# Patient Record
Sex: Female | Born: 1955
Health system: Southern US, Community
[De-identification: ages and names within clinical notes are randomized; demographics above are authoritative.]

## PROBLEM LIST (undated history)

## (undated) DIAGNOSIS — M1990 Unspecified osteoarthritis, unspecified site: Secondary | ICD-10-CM

## (undated) DIAGNOSIS — E7800 Pure hypercholesterolemia, unspecified: Secondary | ICD-10-CM

## (undated) DIAGNOSIS — F429 Obsessive-compulsive disorder, unspecified: Secondary | ICD-10-CM

## (undated) DIAGNOSIS — R7309 Other abnormal glucose: Secondary | ICD-10-CM

## (undated) DIAGNOSIS — K219 Gastro-esophageal reflux disease without esophagitis: Secondary | ICD-10-CM

## (undated) DIAGNOSIS — Z86018 Personal history of other benign neoplasm: Secondary | ICD-10-CM

## (undated) DIAGNOSIS — Z84A Family history of exposure to diethylstilbestrol: Secondary | ICD-10-CM

## (undated) DIAGNOSIS — B977 Papillomavirus as the cause of diseases classified elsewhere: Secondary | ICD-10-CM

## (undated) DIAGNOSIS — Z9889 Other specified postprocedural states: Secondary | ICD-10-CM

## (undated) DIAGNOSIS — F419 Anxiety disorder, unspecified: Secondary | ICD-10-CM

## (undated) DIAGNOSIS — N6019 Diffuse cystic mastopathy of unspecified breast: Secondary | ICD-10-CM

## (undated) DIAGNOSIS — Z8489 Family history of other specified conditions: Secondary | ICD-10-CM

## (undated) DIAGNOSIS — R7303 Prediabetes: Secondary | ICD-10-CM

## (undated) HISTORY — PX: DILATION AND CURETTAGE OF UTERUS: SHX78

## (undated) HISTORY — DX: Papillomavirus as the cause of diseases classified elsewhere: B97.7

## (undated) HISTORY — DX: Diffuse cystic mastopathy of unspecified breast: N60.19

## (undated) HISTORY — DX: Obsessive-compulsive disorder, unspecified: F42.9

## (undated) HISTORY — DX: Pure hypercholesterolemia, unspecified: E78.00

## (undated) HISTORY — PX: LIPOMA EXCISION: SHX5283

## (undated) HISTORY — DX: Family history of exposure to diethylstilbestrol: Z84.A

## (undated) HISTORY — PX: COLONOSCOPY: SHX174

## (undated) HISTORY — PX: BREAST BIOPSY: SHX20

## (undated) HISTORY — DX: Other specified postprocedural states: Z86.018

## (undated) HISTORY — DX: Personal history of other benign neoplasm: Z98.890

## (undated) HISTORY — PX: ANKLE SURGERY: SHX546

## (undated) HISTORY — DX: Family history of other specified conditions: Z84.89

## (undated) HISTORY — DX: Gastro-esophageal reflux disease without esophagitis: K21.9

## (undated) HISTORY — DX: Other abnormal glucose: R73.09

---

## 1997-12-05 ENCOUNTER — Other Ambulatory Visit: Admission: RE | Admit: 1997-12-05 | Discharge: 1997-12-05 | Payer: Self-pay | Admitting: Gynecology

## 1998-06-18 ENCOUNTER — Other Ambulatory Visit: Admission: RE | Admit: 1998-06-18 | Discharge: 1998-06-18 | Payer: Self-pay | Admitting: Gynecology

## 1998-11-27 ENCOUNTER — Other Ambulatory Visit: Admission: RE | Admit: 1998-11-27 | Discharge: 1998-11-27 | Payer: Self-pay | Admitting: Gynecology

## 1998-12-26 ENCOUNTER — Other Ambulatory Visit: Admission: RE | Admit: 1998-12-26 | Discharge: 1998-12-26 | Payer: Self-pay | Admitting: Gynecology

## 2002-11-18 ENCOUNTER — Other Ambulatory Visit: Admission: RE | Admit: 2002-11-18 | Discharge: 2002-11-18 | Payer: Self-pay | Admitting: Gynecology

## 2003-10-23 ENCOUNTER — Other Ambulatory Visit: Admission: RE | Admit: 2003-10-23 | Discharge: 2003-10-23 | Payer: Self-pay | Admitting: Gynecology

## 2004-01-09 ENCOUNTER — Other Ambulatory Visit: Admission: RE | Admit: 2004-01-09 | Discharge: 2004-01-09 | Payer: Self-pay | Admitting: Gynecology

## 2004-10-25 ENCOUNTER — Other Ambulatory Visit: Admission: RE | Admit: 2004-10-25 | Discharge: 2004-10-25 | Payer: Self-pay | Admitting: Gynecology

## 2005-10-28 ENCOUNTER — Other Ambulatory Visit: Admission: RE | Admit: 2005-10-28 | Discharge: 2005-10-28 | Payer: Self-pay | Admitting: Gynecology

## 2006-10-30 ENCOUNTER — Other Ambulatory Visit: Admission: RE | Admit: 2006-10-30 | Discharge: 2006-10-30 | Payer: Self-pay | Admitting: Obstetrics and Gynecology

## 2007-11-05 ENCOUNTER — Other Ambulatory Visit: Admission: RE | Admit: 2007-11-05 | Discharge: 2007-11-05 | Payer: Self-pay | Admitting: Obstetrics and Gynecology

## 2008-11-10 ENCOUNTER — Other Ambulatory Visit: Admission: RE | Admit: 2008-11-10 | Discharge: 2008-11-10 | Payer: Self-pay | Admitting: Obstetrics and Gynecology

## 2012-12-02 ENCOUNTER — Encounter: Payer: Self-pay | Admitting: Obstetrics and Gynecology

## 2012-12-06 ENCOUNTER — Encounter: Payer: Self-pay | Admitting: Obstetrics and Gynecology

## 2012-12-06 ENCOUNTER — Ambulatory Visit (INDEPENDENT_AMBULATORY_CARE_PROVIDER_SITE_OTHER): Payer: No Typology Code available for payment source | Admitting: Obstetrics and Gynecology

## 2012-12-06 VITALS — BP 100/64 | Ht 63.5 in | Wt 145.0 lb

## 2012-12-06 DIAGNOSIS — Z0000 Encounter for general adult medical examination without abnormal findings: Secondary | ICD-10-CM

## 2012-12-06 DIAGNOSIS — Z01419 Encounter for gynecological examination (general) (routine) without abnormal findings: Secondary | ICD-10-CM

## 2012-12-06 LAB — POCT URINALYSIS DIPSTICK
Bilirubin, UA: NEGATIVE
Blood, UA: NEGATIVE
Glucose, UA: NEGATIVE
Ketones, UA: NEGATIVE

## 2012-12-06 MED ORDER — PROGESTERONE MICRONIZED 100 MG PO CAPS: 100.0000 mg | ORAL_CAPSULE | Freq: Every day | ORAL | Status: DC

## 2012-12-06 MED ORDER — ESTRADIOL ACETATE 0.05 MG/24HR VA RING: 1.0000 | VAGINAL_RING | VAGINAL | Status: DC

## 2012-12-06 NOTE — Progress Notes (Addendum)
57 y.o. Married  Caucasian female   G1P0 here for annual exam.  Likes her HRT and wants to continue.    No LMP recorded. Patient is postmenopausal.          Sexually active: yes  The current method of family planning is post menopausal status.    Exercising: fitness instructor, spin, weight lift, pilates , walking Last mammogram:  02/26/12 neg Last pap smear: 11/16/09 neg History of abnormal pap: 10/03/2003 atypical squamous cells of undetermined significance Smoking:quit 37 years ago Alcohol:occ glass of wine Last colonoscopy:08/04/2006 normal repeat in 10 years Last Bone Density:  never Last tetanus shot: 2012 Last cholesterol check: 11/23/12 at [facility] [employer], pending results  Hgb:   13.8           Urine:neg    Health Maintenance  Topic Date Due  . Tetanus/tdap  04/17/1973  . Pap Smear  11/16/2012  . Influenza Vaccine  04/04/2013  . Mammogram  02/25/2014  . Colonoscopy  08/04/2016    Family History  Problem Relation Age of Onset  . Osteoporosis Maternal Grandmother   . Stroke Maternal Grandfather   . Breast cancer Mother     There are no active problems to display for this patient.   Past Medical History  Diagnosis Date  . HPV (human papilloma virus) infection   . OCD (obsessive compulsive disorder)   . Family history of DES exposure   . GERD (gastroesophageal reflux disease)   . S/P excision of lipoma   . Fibrocystic breast     Past Surgical History  Procedure Laterality Date  . Ankle surgery      broken as a child  . Lipoma excision      rib cage  . Dilation and curettage of uterus      Allergies: Aspirin  Current Outpatient Prescriptions  Medication Sig Dispense Refill  . CALCIUM PO Take 500 mg by mouth 2 (two) times daily.      Marland Kitchen escitalopram (LEXAPRO) 10 MG tablet Take 10 mg by mouth daily.      . Estradiol Acetate (FEMRING) 0.05 MG/24HR RING Place vaginally every 3 (three) months.      . progesterone (PROMETRIUM) 100 MG capsule Take 100 mg  by mouth at bedtime.      . ranitidine (ZANTAC) 150 MG tablet Take 150 mg by mouth as needed for heartburn.      . Dexlansoprazole (DEXILANT PO) Take 60 mg by mouth daily.       No current facility-administered medications for this visit.    ROS: Pertinent items are noted in HPI.  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, no children  Exam:    BP 100/64  Ht 5' 3.5" (1.613 m)  Wt 145 lb (65.772 kg)  BMI 25.28 kg/m2  Down 2 pounds from last year Wt Readings from Last 3 Encounters:  12/06/12 145 lb (65.772 kg)     Ht Readings from Last 3 Encounters:  12/06/12 5' 3.5" (1.613 m)    General appearance: alert, cooperative and appears stated age Head: Normocephalic, without obvious abnormality, atraumatic Neck: no adenopathy, supple, symmetrical, trachea midline and thyroid not enlarged, symmetric, no tenderness/mass/nodules Lungs: clear to auscultation bilaterally Breasts: Inspection negative, No nipple retraction or dimpling, No nipple discharge or bleeding, No axillary or supraclavicular adenopathy, Normal to palpation without dominant masses Heart: regular rate and rhythm Abdomen: soft, non-tender; bowel sounds normal; no masses,  no organomegaly Extremities: extremities normal,  atraumatic, no cyanosis or edema Skin: Skin color, texture, turgor normal. No rashes or lesions Lymph nodes: Cervical, supraclavicular, and axillary nodes normal. No abnormal inguinal nodes palpated Neurologic: Grossly normal   Pelvic: External genitalia:  no lesions              Urethra:  normal appearing urethra with no masses, tenderness or lesions              Bartholins and Skenes: normal                 Vagina: normal appearing vagina with normal color and discharge, no lesions              Cervix: normal appearance, os stenotic and would not allow a cytobrush              Pap taken: yes        Bimanual Exam:  Uterus:  uterus is normal size, shape, consistency  and nontender                                      Adnexa: normal adnexa in size, nontender and no masses                                      Rectovaginal: Confirms                                      Anus:  normal sphincter tone, no lesions  A: normal menoopausal exam, on HRT     DES exposed     OCD     Mother with breast cancer at age 27     P: mammogram pap smear counseled on breast self exam, mammography screening, use and side effects of HRT, adequate intake of calcium and vitamin D, diet and exercise return annually or prn     An After Visit Summary was printed and given to the patient.

## 2012-12-06 NOTE — Patient Instructions (Signed)

## 2012-12-07 LAB — HEMOGLOBIN, FINGERSTICK: Hemoglobin, fingerstick: 13.8 g/dL (ref 12.0–16.0)

## 2013-01-11 ENCOUNTER — Other Ambulatory Visit: Payer: Self-pay | Admitting: Obstetrics and Gynecology

## 2013-01-11 NOTE — Telephone Encounter (Signed)
Fine to RF through AEX.

## 2013-01-11 NOTE — Telephone Encounter (Signed)
Last Aex 12/06/12 Next Aex 12/12/13  Has 2 requests Femring 0.05 mg and Progesterone 100 mg

## 2013-03-04 HISTORY — PX: TRIGGER FINGER RELEASE: SHX641

## 2013-12-07 ENCOUNTER — Other Ambulatory Visit: Payer: Self-pay | Admitting: *Deleted

## 2013-12-07 MED ORDER — ESTRADIOL ACETATE 0.05 MG/24HR VA RING: VAGINAL_RING | VAGINAL | Status: DC

## 2013-12-07 NOTE — Telephone Encounter (Signed)
Last AEX 12/06/12 Last refill 01/11/13 #1/3 refills Next appt 12/12/13  Will refill once until appt.

## 2013-12-12 ENCOUNTER — Ambulatory Visit: Payer: No Typology Code available for payment source | Admitting: Obstetrics and Gynecology

## 2013-12-12 ENCOUNTER — Encounter: Payer: Self-pay | Admitting: Gynecology

## 2013-12-12 ENCOUNTER — Ambulatory Visit (INDEPENDENT_AMBULATORY_CARE_PROVIDER_SITE_OTHER): Payer: No Typology Code available for payment source | Admitting: Gynecology

## 2013-12-12 VITALS — BP 120/74 | HR 60 | Resp 18 | Ht 63.5 in | Wt 144.0 lb

## 2013-12-12 DIAGNOSIS — Z01419 Encounter for gynecological examination (general) (routine) without abnormal findings: Secondary | ICD-10-CM

## 2013-12-12 DIAGNOSIS — Z0000 Encounter for general adult medical examination without abnormal findings: Secondary | ICD-10-CM

## 2013-12-12 DIAGNOSIS — Z803 Family history of malignant neoplasm of breast: Secondary | ICD-10-CM

## 2013-12-12 DIAGNOSIS — Z79890 Hormone replacement therapy (postmenopausal): Secondary | ICD-10-CM

## 2013-12-12 DIAGNOSIS — Z9189 Other specified personal risk factors, not elsewhere classified: Secondary | ICD-10-CM | POA: Insufficient documentation

## 2013-12-12 LAB — POCT URINALYSIS DIPSTICK
BILIRUBIN UA: NEGATIVE
Glucose, UA: NEGATIVE
Ketones, UA: NEGATIVE
Leukocytes, UA: NEGATIVE
NITRITE UA: NEGATIVE
PH UA: 7
PROTEIN UA: NEGATIVE
RBC UA: NEGATIVE
Urobilinogen, UA: NEGATIVE

## 2013-12-12 MED ORDER — CONJ ESTROGENS-BAZEDOXIFENE 0.45-20 MG PO TABS: 1.0000 | ORAL_TABLET | Freq: Every day | ORAL | Status: DC

## 2013-12-12 NOTE — Progress Notes (Signed)
58 y.o. Married Caucasian female   G1P0 here for annual exam.  She does not report hot flashes, does have night sweats, does not have vaginal dryness.  She is not using lubricants.  She does not report post-menopasual bleeding.  Pt is concerned regarding her HRT, her mother was diagnosed with 2 primary breast cancers at 51, treated only with lumpectomy and now has a recurrence vs new primary.  Mother took DES in past.    No LMP recorded. Patient is postmenopausal.          Sexually active: yes  The current method of family planning is post menopausal status.    Exercising: yes  Gym/ health club routine includes cardio, light weights, treadmill and walking on track . Last pap: 12/2012 Neg. HR HPV: Neg Abnormal PAP: yes, 2005  Mammogram: 03/01/13 BI-RADS 2: Benign  BSE: once a month Colonoscopy: 2008 - Every 10 years DEXA: Never Alcohol: Yes, Occ glass of wine Tobacco: Former  Corporate treasurer  Topic Date Due  . Tetanus/tdap  04/17/1973  . Influenza Vaccine  03/04/2014  . Mammogram  03/02/2015  . Pap Smear  12/07/2015  . Colonoscopy  08/04/2016    Family History  Problem Relation Age of Onset  . Osteoporosis Maternal Grandmother   . Stroke Maternal Grandfather   . Breast cancer Mother     There are no active problems to display for this patient.   Past Medical History  Diagnosis Date  . HPV (human papilloma virus) infection   . OCD (obsessive compulsive disorder)   . Family history of DES exposure   . GERD (gastroesophageal reflux disease)   . S/P excision of lipoma   . Fibrocystic breast     Past Surgical History  Procedure Laterality Date  . Ankle surgery      broken as a child  . Lipoma excision      rib cage  . Dilation and curettage of uterus      Allergies: Aspirin  Current Outpatient Prescriptions  Medication Sig Dispense Refill  . CALCIUM PO Take 500 mg by mouth 2 (two) times daily.      Marland Kitchen Dexlansoprazole (DEXILANT PO) Take 60 mg by mouth daily.       Marland Kitchen escitalopram (LEXAPRO) 10 MG tablet Take 10 mg by mouth daily.      . Estradiol Acetate (FEMRING) 0.05 MG/24HR RING Use 1 ring vaginally every 3 months.  1 each  0  . progesterone (PROMETRIUM) 100 MG capsule TAKE ONE CAPSULE EVERY DAY  90 capsule  3  . ranitidine (ZANTAC) 150 MG tablet Take 150 mg by mouth as needed for heartburn.       No current facility-administered medications for this visit.    ROS: Pertinent items are noted in HPI.  Exam:    BP 120/74  Pulse 60  Resp 18  Ht 5' 3.5" (1.613 m)  Wt 144 lb (65.318 kg)  BMI 25.11 kg/m2 Weight change: @WEIGHTCHANGE @ Last 3 height recordings:  Ht Readings from Last 3 Encounters:  12/12/13 5' 3.5" (1.613 m)  12/06/12 5' 3.5" (1.613 m)   General appearance: alert, cooperative and appears stated age Head: Normocephalic, without obvious abnormality, atraumatic Neck: no adenopathy, no carotid bruit, no JVD, supple, symmetrical, trachea midline and thyroid not enlarged, symmetric, no tenderness/mass/nodules Lungs: clear to auscultation bilaterally Breasts: normal appearance, no masses or tenderness Heart: regular rate and rhythm, S1, S2 normal, no murmur, click, rub or gallop Abdomen: soft, non-tender; bowel sounds normal; no masses,  no organomegaly Extremities: extremities normal, atraumatic, no cyanosis or edema Skin: Skin color, texture, turgor normal. No rashes or lesions Lymph nodes: Cervical, supraclavicular, and axillary nodes normal. no inguinal nodes palpated Neurologic: Grossly normal   Pelvic: External genitalia:  no lesions              Urethra: normal appearing urethra with no masses, tenderness or lesions              Bartholins and Skenes: normal                 Vagina: normal appearing vagina with normal color and discharge, no lesions              Cervix: normal appearance and tight os              Pap taken: yes        Bimanual Exam:  Uterus:  uterus is normal size, shape, consistency and nontender                                       Adnexa:    no masses                                      Rectovaginal: Confirms                                      Anus:  normal sphincter tone, no lesions  A: well woman 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  Her mother is having a consult tomorrow and pt will know more afterwards.  Mother does have increased risk due to DES, cannot rule out genetic with 2-3 primaries.  We discussed stopping HRT but recommend vaginal estrogen, can try effexor or lexapro at higher dose to counter hot flashes, we discussed duavee that would protect against breast cancer.  We reviewed the findings of the [facility] and nurses studies and different outcomes re CVD and CVA.  Pt would like to change to [facility] and may change her mind after her mother's f/u counseled on breast self exam, mammography screening, adequate intake of calcium and vitamin D, diet and exercise return annually or prn Discussed PAP guideline changes, importance of weight bearing exercises, calcium, vit D and balanced diet.  An After Visit Summary was printed and given to the patient.

## 2013-12-12 NOTE — Patient Instructions (Signed)

## 2013-12-13 LAB — IPS PAP SMEAR ONLY

## 2013-12-15 ENCOUNTER — Telehealth: Payer: Self-pay | Admitting: Gynecology

## 2013-12-15 NOTE — Telephone Encounter (Signed)
Spoke with patient. Patient states that she was seen Monday by Dr.Lathrop and they decided patient would start [facility] instead of using estrogen ring and progesterone due to mother's history with breast cancer. Patient calling to let Dr.Lathrop know that her mother was diagnosed with another type of breast cancer this week. The first diagnoses was ductal breast cancer and the second is lobular breast cancer. Patient took estrogen ring out last night and stopped taking progesterone. Would like to know if Dr.Lathrop still recommends that she take [facility] with her mother's new diagnosis. "I am willing not to take anything if that is recommended but if she thinks that it is okay for me to take [facility] then I will." Advised would send a message Dr.Lathrop and give patient a call back with any further recommendations or instructions. Patient agreeable.

## 2013-12-15 NOTE — Telephone Encounter (Signed)
Patient has some questions about her mom being diagnosed with breast cancer. Patient has new information and wants to be sure she is taking the correct RX for herself.  [place] [employer]

## 2013-12-19 NOTE — Telephone Encounter (Signed)
Patient calling to see if Dr.Lathrop had any further instructions for her using Duavee. Advised patient I had not yet received a response regarding her taking Duavee but that I will make sure that I speak with Dr.Lathrop as soon as possible today and give patient a call back with her advice. Patient agreeable.

## 2013-12-19 NOTE — Telephone Encounter (Signed)
Spoke with patient. Message from [place] given as seen below. Patient agreeable and verbalizes understanding.  Routing to provider for final review. Patient agreeable to disposition. Will close encounter

## 2013-12-19 NOTE — Telephone Encounter (Signed)
While her mother took DES which increases the mother's risk for breast cancer but not the patients, the duavee would decrease her risk for breast cancer

## 2014-06-05 ENCOUNTER — Encounter: Payer: Self-pay | Admitting: Gynecology

## 2014-07-04 ENCOUNTER — Telehealth: Payer: Self-pay | Admitting: Gynecology

## 2014-07-04 NOTE — Telephone Encounter (Signed)
Left message regarding upcoming appointment has been canceled and needs to be rescheduled. °

## 2014-12-18 ENCOUNTER — Ambulatory Visit: Payer: No Typology Code available for payment source | Admitting: Gynecology

## 2015-01-04 ENCOUNTER — Ambulatory Visit (INDEPENDENT_AMBULATORY_CARE_PROVIDER_SITE_OTHER): Payer: No Typology Code available for payment source | Admitting: Obstetrics and Gynecology

## 2015-01-04 ENCOUNTER — Encounter: Payer: Self-pay | Admitting: Obstetrics and Gynecology

## 2015-01-04 VITALS — BP 114/68 | HR 76 | Resp 14 | Ht 63.5 in | Wt 147.0 lb

## 2015-01-04 DIAGNOSIS — Z79890 Hormone replacement therapy (postmenopausal): Secondary | ICD-10-CM

## 2015-01-04 DIAGNOSIS — Z803 Family history of malignant neoplasm of breast: Secondary | ICD-10-CM | POA: Diagnosis not present

## 2015-01-04 DIAGNOSIS — Z01419 Encounter for gynecological examination (general) (routine) without abnormal findings: Secondary | ICD-10-CM

## 2015-01-04 MED ORDER — CONJ ESTROGENS-BAZEDOXIFENE 0.45-20 MG PO TABS: 1.0000 | ORAL_TABLET | Freq: Every day | ORAL | Status: DC

## 2015-01-04 NOTE — Progress Notes (Signed)
Patient ID: Ashley Blair, female   DOB: 01-31-1956, 59 y.o.   MRN: [id number] 59 y.o. G1P0 Unknown Caucasian female here for annual exam.    Mother with history of breast cancer at age 23 or 23 and then had a second primary breast cancer. Genetic testing was negative.   Patient was placed on [facility]. Occasionally has night sweats.  Went directly from [facility] to HRT.  Took Prometrium and FemRing in the past.  Switched to [facility] with last annual exam.  Labs with PCP.  High HDL cholesterol and LDL is slightly high.   PCP:  Dr. Leighton Ruff  No LMP recorded. Patient is postmenopausal.          Sexually active: Yes.    The current method of family planning is condoms and post menopausal status.    Exercising: Yes.    Cardio and weight lifting Smoker:  Former smoker  Health Maintenance: Pap:  12-12-13 WNL H/O DES History of abnormal Pap:  Yes.  Has had colposcopies and no treatment.  History of colposcopy.  MMG:  03-07-14 3D WNL fibroglandular density.  History of breast cysts. History of breast biopsy on left about 5 years ago at [facility].  Colonoscopy:  08-04-06 repeat in 10 years BMD:   Never TDaP:  With in the last 10 years per patient Screening Labs:  PCP Hb today: PCP Urine today: PCP   reports that she quit smoking about 39 years ago. She has never used smokeless tobacco. She reports that she drinks about 0.5 oz of alcohol per week. She reports that she does not use illicit drugs.  Past Medical History  Diagnosis Date  . HPV (human papilloma virus) infection   . OCD (obsessive compulsive disorder)   . Family history of DES exposure   . GERD (gastroesophageal reflux disease)   . S/P excision of lipoma   . Fibrocystic breast     Past Surgical History  Procedure Laterality Date  . Ankle surgery      broken as a child  . Lipoma excision      rib cage  . Dilation and curettage of uterus      Current Outpatient Prescriptions  Medication Sig Dispense Refill  . CALCIUM  PO Take 500 mg by mouth 2 (two) times daily.    Marland Kitchen Conj Estrogens-Bazedoxifene (DUAVEE) 0.45-20 MG TABS Take 1 tablet by mouth daily. 30 tablet 12  . Dexlansoprazole (DEXILANT PO) Take 60 mg by mouth daily.    Marland Kitchen escitalopram (LEXAPRO) 10 MG tablet Take 10 mg by mouth daily.    . Estradiol Acetate (FEMRING) 0.05 MG/24HR RING Use 1 ring vaginally every 3 months. 1 each 0  . progesterone (PROMETRIUM) 100 MG capsule TAKE ONE CAPSULE EVERY DAY 90 capsule 3  . ranitidine (ZANTAC) 150 MG tablet Take 150 mg by mouth as needed for heartburn.     No current facility-administered medications for this visit.    Family History  Problem Relation Age of Onset  . Osteoporosis Maternal Grandmother   . Stroke Maternal Grandfather   . Breast cancer Mother     ROS:  Pertinent items are noted in HPI.  Otherwise, a comprehensive ROS was negative.  Exam:   There were no vitals taken for this visit.    General appearance: alert, cooperative and appears stated age Head: Normocephalic, without obvious abnormality, atraumatic Neck: no adenopathy, supple, symmetrical, trachea midline and thyroid normal to inspection and palpation Lungs: clear to auscultation bilaterally Breasts: normal appearance, no masses  or tenderness, Inspection negative, No nipple retraction or dimpling, No nipple discharge or bleeding, No axillary or supraclavicular adenopathy Heart: regular rate and rhythm Abdomen: soft, non-tender; bowel sounds normal; no masses,  no organomegaly Extremities: extremities normal, atraumatic, no cyanosis or edema Skin: Skin color, texture, turgor normal. No rashes or lesions Lymph nodes: Cervical, supraclavicular, and axillary nodes normal. No abnormal inguinal nodes palpated Neurologic: Grossly normal  Pelvic: External genitalia:  no lesions              Urethra:  normal appearing urethra with no masses, tenderness or lesions              Bartholins and Skenes: normal                 Vagina: normal  appearing vagina with normal color and discharge, no lesions              Cervix: no lesions              Pap taken: Yes.   Bimanual Exam:  Uterus:  normal size, contour, position, consistency, mobility, non-tender              Adnexa: normal adnexa and no mass, fullness, tenderness              Rectovaginal: Yes.  .  Confirms.              Anus:  normal sphincter tone, no lesions  Chaperone was present for exam.  Assessment:   Well woman visit with normal exam. HRT with Duavee. Hx of DES exposure.  Family hx of breast cancer.  Genetic testing negative.   Plan: Yearly mammogram recommended after age 37.  Recommended self breast exam.  Pap and HR HPV as above. Discussed Calcium, Vitamin D, regular exercise program including cardiovascular and weight bearing exercise. Labs performed.  No..   See orders. Refills given on medications.  Yes.  .  See orders.  Refill of Duavee.  Discussed risks and benefits.  If will continue with HRT, this option may have the least risk for patient.  Will reassess yearly.  Follow up annually and prn.     After visit summary provided.

## 2015-01-04 NOTE — Patient Instructions (Signed)
EXERCISE AND DIET:  We recommended that you start or continue a regular exercise program for good health. Regular exercise means any activity that makes your heart beat faster and makes you sweat.  We recommend exercising at least 30 minutes per day at least 3 days a week, preferably 4 or 5.  We also recommend a diet low in fat and sugar.  Inactivity, poor dietary choices and obesity can cause diabetes, heart attack, stroke, and kidney damage, among others.    ALCOHOL AND SMOKING:  Women should limit their alcohol intake to no more than 7 drinks/beers/glasses of wine (combined, not each!) per week. Moderation of alcohol intake to this level decreases your risk of breast cancer and liver damage. And of course, no recreational drugs are part of a healthy lifestyle.  And absolutely no smoking or even second hand smoke. Most people know smoking can cause heart and lung diseases, but did you know it also contributes to weakening of your bones? Aging of your skin?  Yellowing of your teeth and nails?  CALCIUM AND VITAMIN D:  Adequate intake of calcium and Vitamin D are recommended.  The recommendations for exact amounts of these supplements seem to change often, but generally speaking 600 mg of calcium (either carbonate or citrate) and 800 units of Vitamin D per day seems prudent. Certain women may benefit from higher intake of Vitamin D.  If you are among these women, your doctor will have told you during your visit.    PAP SMEARS:  Pap smears, to check for cervical cancer or precancers,  have traditionally been done yearly, although recent scientific advances have shown that most women can have pap smears less often.  However, every woman still should have a physical exam from her gynecologist every year. It will include a breast check, inspection of the vulva and vagina to check for abnormal growths or skin changes, a visual exam of the cervix, and then an exam to evaluate the size and shape of the uterus and  ovaries.  And after 61 years of age, a rectal exam is indicated to check for rectal cancers. We will also provide age appropriate advice regarding health maintenance, like when you should have certain vaccines, screening for sexually transmitted diseases, bone density testing, colonoscopy, mammograms, etc.   MAMMOGRAMS:  All women over 40 years old should have a yearly mammogram. Many facilities now offer a "3D" mammogram, which may cost around $50 extra out of pocket. If possible,  we recommend you accept the option to have the 3D mammogram performed.  It both reduces the number of women who will be called back for extra views which then turn out to be normal, and it is better than the routine mammogram at detecting truly abnormal areas.    COLONOSCOPY:  Colonoscopy to screen for colon cancer is recommended for all women at age 50.  We know, you hate the idea of the prep.  We agree, BUT, having colon cancer and not knowing it is worse!!  Colon cancer so often starts as a polyp that can be seen and removed at colonscopy, which can quite literally save your life!  And if your first colonoscopy is normal and you have no family history of colon cancer, most women don't have to have it again for 10 years.  Once every ten years, you can do something that may end up saving your life, right?  We will be happy to help you get it scheduled when you are ready.    Be sure to check your insurance coverage so you understand how much it will cost.  It may be covered as a preventative service at no cost, but you should check your particular policy.     Conjugated Estrogens; Bazedoxifene tablets What is this medicine? CONJUGATED ESTROGENS; BAZEDOXIFENE (CON ju gate ed ESS troe jenz; BAY ze DOX i feen) is used as hormone replacement in menopausal women who still have their uterus. This medicine helps to treat hot flashes and prevent osteoporosis (weak bones). This medicine may be used for other purposes; ask your health care  provider or pharmacist if you have questions. COMMON BRAND NAME(S): DUAVEE What should I tell my health care provider before I take this medicine? They need to know if you have any of these conditions: -abnormal vaginal bleeding -blood vessel disease or blood clots -breast, cervical, endometrial, ovarian, liver, or uterine cancer -dementia -diabetes -endometriosis -fibroids -gallbladder disease -heart disease or recent heart attack -hereditary angioedema -high blood pressure -high cholesterol -high level of calcium in the blood -kidney disease -liver disease -mental depression -migraine headaches -porphyria -protein C deficiency -protein S deficiency -seizure disorder -stroke -systemic lupus erythematosus -thyroid disorder -tobacco smoker -an unusual or allergic reaction to estrogens, bazedoxifene, other medicines, foods, dyes, or preservatives -pregnant or trying to get pregnant -breast-feeding How should I use this medicine? Take this medicine by mouth with a glass of water. Follow the directions on the prescription label. Take your medicine at regular intervals, at the same time each day. Do not take your medicine more often than directed. A patient package insert for the product will be given with each prescription and refill. Read this sheet carefully each time. Talk to your pediatrician regarding the use of this medicine in children. This medicine is not approved for use in children. Overdosage: If you think you've taken too much of this medicine contact a poison control center or emergency room at once. Overdosage: If you think you have taken too much of this medicine contact a poison control center or emergency room at once. NOTE: This medicine is only for you. Do not share this medicine with others. What if I miss a dose? If you miss a dose, take it as soon as you can. If it is almost time for your next dose, take only that dose. Do not take double or extra  doses. What may interact with this medicine? -aromatase inhibitors like aminoglutethimide, anastrozole, exemestane, letrozole, testolactone -metyrapone This medicine may also interact with the following medications: -barbiturates, such as phenobarbital -carbamazepine -clarithromycin -erythromycin -grapefruit juice -medicines for fungal infections like ketoconazole and itraconazole -phenytoin -rifampin -ritonavir -St. John's Wort -thyroid hormones This list may not describe all possible interactions. Give your health care provider a list of all the medicines, herbs, non-prescription drugs, or dietary supplements you use. Also tell them if you smoke, drink alcohol, or use illegal drugs. Some items may interact with your medicine. What should I watch for while using this medicine? Do not take a progestin product or additional estrogen or estrogen-like products while taking this drug. Discuss the risks and benefits of taking this drug with your prescriber. Visit your health care professional for regular checks on your progress. You will need a regular breast and pelvic exam and Pap smear while on this medicine. You should also discuss the need for regular mammograms with your health care professional, and follow his or her guidelines for these tests. This medicine can make your body retain fluid, making your fingers, hands, or  ankles swell. Your blood pressure can go up. Contact your doctor or health care professional if you feel you are retaining fluid. You should make sure you get enough calcium and vitamin D in your diet while you are taking this medicine. Discuss your dietary needs with your health care professional or nutritionist. Exercise may help to prevent bone loss. Discuss your exercise needs with your doctor or health care professional. This medicine can rarely cause blood clots. You should avoid long periods of bed rest while taking this medicine. If you are going to have surgery,  tell your doctor or health care professional that you are taking this medicine. This medicine should be stopped at least 3 days before surgery. After surgery, it should be restarted only after you are walking again. It should not be restarted while you still need long periods of bed rest. Smoking increases the risk of getting a blood clot or having a stroke while you are taking this medicine, especially if you are more than 61 years old. You are strongly advised not to smoke. If you have any reason to think you are pregnant; stop taking this medicine at once and contact your doctor or health care professional. If you wear contact lenses and notice visual changes, or if the lenses begin to feel uncomfortable, consult your eye care specialist. What side effects may I notice from receiving this medicine? Side effects that you should report to your doctor or health care professional as soon as possible: -allergic reactions like skin rash, itching or hives, swelling of the face, lips, or tongue -breast tissue changes or discharge -changes in vision -chest pain -confusion, trouble speaking or understanding -dark urine -general ill feeling or flu-like symptoms -light-colored stools -nausea, vomiting -pain, swelling, warmth in the leg -right upper belly pain -severe headaches -shortness of breath -sudden numbness or weakness of the face, arm or leg -trouble walking, dizziness, loss of balance or coordination -unusual vaginal bleeding -yellowing of the eyes or skin Side effects that usually do not require medical attention (Report these to your doctor or health care professional if they continue or are bothersome.): -abdominal pain -diarrhea -dizziness -hair loss -increased hunger or thirst -increased urination -nausea -symptoms of vaginal infection like itching, irritation or unusual discharge -unusually weak or tired -upset stomach This list may not describe all possible side effects. Call  your doctor for medical advice about side effects. You may report side effects to FDA at 1-800-FDA-1088. Where should I keep my medicine? Keep out of the reach of children. Store at room temperature between 15 and 30 degrees C (59 and 86 degrees F). Throw away any unused medicine after the expiration date. NOTE: This sheet is a summary. It may not cover all possible information. If you have questions about this medicine, talk to your doctor, pharmacist, or health care provider.  2015, [employer]. (2012-05-10 15:15:52)

## 2015-01-08 LAB — IPS PAP TEST WITH HPV

## 2015-03-15 ENCOUNTER — Telehealth: Payer: Self-pay | Admitting: Obstetrics and Gynecology

## 2015-03-15 NOTE — Telephone Encounter (Signed)
Spoke with patient. Advised we have received BMD results. Will have Dr.Silva review and return call to discuss. Patient is agreeable. BMD results to Dr.Silva's desk.

## 2015-03-15 NOTE — Telephone Encounter (Signed)
Calling for bone density results.

## 2015-03-16 NOTE — Telephone Encounter (Signed)
Spoke with patient. Apologized as result we received were her mammogram results not her BMD. Advised mammogram results are normal per Dr.Silva's review. Advised we are waiting to receive BMD results and as soon as we receive them we will give her a call to go over the results with her. Patient is agreeable and verbalizes understanding.  Routing to provider for final review. Patient agreeable to disposition. Will close encounter.   Patient aware provider will review message and nurse will return call if any additional advice or change of disposition.

## 2015-03-20 ENCOUNTER — Telehealth: Payer: Self-pay

## 2015-03-20 NOTE — Telephone Encounter (Signed)
Spoke with patient. Advised we received BMD and Dr.Silva has reviewed the results. Advised bone density is normal. Please see paper note from [place] on BMD result. Patient is agreeable.  Routing to provider for final review. Patient agreeable to disposition. Will close encounter.   Patient aware provider will review message and nurse will return call if any additional advice or change of disposition.

## 2016-01-10 ENCOUNTER — Ambulatory Visit: Payer: No Typology Code available for payment source | Admitting: Obstetrics and Gynecology

## 2016-01-11 ENCOUNTER — Ambulatory Visit (INDEPENDENT_AMBULATORY_CARE_PROVIDER_SITE_OTHER): Payer: No Typology Code available for payment source | Admitting: Obstetrics and Gynecology

## 2016-01-11 ENCOUNTER — Encounter: Payer: Self-pay | Admitting: Obstetrics and Gynecology

## 2016-01-11 VITALS — BP 120/78 | HR 60 | Resp 16 | Ht 63.0 in | Wt 141.8 lb

## 2016-01-11 DIAGNOSIS — Z01419 Encounter for gynecological examination (general) (routine) without abnormal findings: Secondary | ICD-10-CM

## 2016-01-11 DIAGNOSIS — Z119 Encounter for screening for infectious and parasitic diseases, unspecified: Secondary | ICD-10-CM | POA: Diagnosis not present

## 2016-01-11 DIAGNOSIS — Z113 Encounter for screening for infections with a predominantly sexual mode of transmission: Secondary | ICD-10-CM

## 2016-01-11 LAB — POCT URINALYSIS DIPSTICK
Bilirubin, UA: NEGATIVE
Glucose, UA: NEGATIVE
Ketones, UA: NEGATIVE
Leukocytes, UA: NEGATIVE
Nitrite, UA: NEGATIVE
PROTEIN UA: NEGATIVE
RBC UA: NEGATIVE
UROBILINOGEN UA: NEGATIVE
pH, UA: 5

## 2016-01-11 NOTE — Progress Notes (Signed)
Patient ID: Ashley Blair, female   DOB: [birth year], 60 y.o.   MRN: [id number] 60 y.o. G1P0 Unknown Caucasian female here for annual exam.    Ran out of Duavee on Saturday. Very little night sweats.   Doing dietary changes due to elevated hemoglobin A1C.   PCP: Leighton Ruff, MD    No LMP recorded. Patient is postmenopausal.           Sexually active: Yes.   female The current method of family planning is post menopausal status.    Exercising: Yes.    Cardio and strength training Smoker:  no  Health Maintenance:  Pap:  01-04-15 Neg:Neg HR HPV.  Hx DES exposure.  History of abnormal Pap:  Yes, hx of colposcopy but no teatment to cervix MMG:  03-13-15 3D/Density B/Neg/BiRads1:[facility] Colonoscopy:  2008 normal with Dr. Jeneen Rinks [place];next due 2018 BMD:   03-13-15   Result  Normal:[facility] TDaP:  Up to date with PCP Gardasil:   N/A HIV:  today Hep C: today. Screening Labs:  Hb today: PCP, Urine today:  Neg.   reports that she quit smoking about 40 years ago. She has never used smokeless tobacco. She reports that she drinks about 0.6 oz of alcohol per week. She reports that she does not use illicit drugs.  Past Medical History  Diagnosis Date  . HPV (human papilloma virus) infection   . OCD (obsessive compulsive disorder)   . Family history of DES exposure   . GERD (gastroesophageal reflux disease)   . S/P excision of lipoma   . Fibrocystic breast     Past Surgical History  Procedure Laterality Date  . Ankle surgery      broken as a child  . Lipoma excision      rib cage  . Dilation and curettage of uterus      Current Outpatient Prescriptions  Medication Sig Dispense Refill  . CALCIUM PO Take 500 mg by mouth 2 (two) times daily.    Marland Kitchen Dexlansoprazole (DEXILANT PO) Take 60 mg by mouth daily.    Marland Kitchen escitalopram (LEXAPRO) 10 MG tablet Take 10 mg by mouth daily.    . ranitidine (ZANTAC) 150 MG tablet Take 150 mg by mouth as needed for heartburn.    Adalberto Ill Estrogens-Bazedoxifene  (DUAVEE) 0.45-20 MG TABS Take 1 tablet by mouth daily. (Patient not taking: Reported on 01/11/2016) 90 tablet 3   No current facility-administered medications for this visit.    Family History  Problem Relation Age of Onset  . Osteoporosis Maternal Grandmother   . Stroke Maternal Grandfather   . Breast cancer Mother     ROS:  Pertinent items are noted in HPI.  Otherwise, a comprehensive ROS was negative.  Exam:   BP 120/78 mmHg  Pulse 60  Resp 16  Ht 5\' 3"  (1.6 m)  Wt 141 lb 12.8 oz (64.32 kg)  BMI 25.13 kg/m2  LMP     General appearance: alert, cooperative and appears stated age Head: Normocephalic, without obvious abnormality, atraumatic Neck: no adenopathy, supple, symmetrical, trachea midline and thyroid normal to inspection and palpation Lungs: clear to auscultation bilaterally Breasts: normal appearance, no masses or tenderness, Inspection negative, No nipple retraction or dimpling, No nipple discharge or bleeding, No axillary or supraclavicular adenopathy Heart: regular rate and rhythm Abdomen: incisions:  No.    , soft, non-tender; no masses, no organomegaly Extremities: extremities normal, atraumatic, no cyanosis or edema Skin: Skin color, texture, turgor normal. No rashes or lesions Lymph nodes: Cervical,  supraclavicular, and axillary nodes normal. No abnormal inguinal nodes palpated Neurologic: Grossly normal  Pelvic: External genitalia:  no lesions              Urethra:  normal appearing urethra with no masses, tenderness or lesions              Bartholins and Skenes: normal                 Vagina: normal appearing vagina with normal color and discharge, no lesions              Cervix: no lesions              Pap taken: Yes.   Bimanual Exam:  Uterus:  normal size, contour, position, consistency, mobility, non-tender              Adnexa: normal adnexa and no mass, fullness, tenderness              Rectal exam: Yes.  .  Confirms.              Anus:  normal  sphincter tone, no lesions  Chaperone was present for exam.  Assessment:   Well woman visit with normal exam. Off HRT recently.  No significant vasomotor symptoms. Hx of DES exposure.  Family hx of breast cancer. Genetic testing negative in her mother. Elevated hemoglobin A1C.  Working on reduction through dietary change.  Plan: Yearly mammogram recommended after age 51.  Recommended self breast exam.  Pap and HR HPV as above. Discussed Calcium, Vitamin D, regular exercise program including cardiovascular and weight bearing exercise. Labs performed.  Yes.  .   See orders.  HIV.  Hep C. Prescription medication(s) given.  No..    I discussed risks and benefits of Duavee.   Patient chooses to stop this.  I did discuss tx for vaginal atrophy if dryness begins now that she is stopping Duavee.  Water based products, cooking oils, and local vaginal estrogen discussed. Follow up annually and prn.       After visit summary provided.

## 2016-01-11 NOTE — Patient Instructions (Signed)
Health Maintenance, Female Adopting a healthy lifestyle and getting preventive care can go a long way to promote health and wellness. Talk with your health care provider about what schedule of regular examinations is right for you. This is a good chance for you to check in with your provider about disease prevention and staying healthy. In between checkups, there are plenty of things you can do on your own. Experts have done a lot of research about which lifestyle changes and preventive measures are most likely to keep you healthy. Ask your health care provider for more information. WEIGHT AND DIET  Eat a healthy diet  Be sure to include plenty of vegetables, fruits, low-fat dairy products, and lean protein.  Do not eat a lot of foods high in solid fats, added sugars, or salt.  Get regular exercise. This is one of the most important things you can do for your health.  Most adults should exercise for at least 150 minutes each week. The exercise should increase your heart rate and make you sweat (moderate-intensity exercise).  Most adults should also do strengthening exercises at least twice a week. This is in addition to the moderate-intensity exercise.  Maintain a healthy weight  Body mass index (BMI) is a measurement that can be used to identify possible weight problems. It estimates body fat based on height and weight. Your health care provider can help determine your BMI and help you achieve or maintain a healthy weight.  For females 20 years of age and older:   A BMI below 18.5 is considered underweight.  A BMI of 18.5 to 24.9 is normal.  A BMI of 25 to 29.9 is considered overweight.  A BMI of 30 and above is considered obese.  Watch levels of cholesterol and blood lipids  You should start having your blood tested for lipids and cholesterol at 62 years of age, then have this test every 5 years.  You may need to have your cholesterol levels checked more often if:  Your lipid  or cholesterol levels are high.  You are older than 62 years of age.  You are at high risk for heart disease.  CANCER SCREENING   Lung Cancer  Lung cancer screening is recommended for adults 55-80 years old who are at high risk for lung cancer because of a history of smoking.  A yearly low-dose CT scan of the lungs is recommended for people who:  Currently smoke.  Have quit within the past 15 years.  Have at least a 30-pack-year history of smoking. A pack year is smoking an average of one pack of cigarettes a day for 1 year.  Yearly screening should continue until it has been 15 years since you quit.  Yearly screening should stop if you develop a health problem that would prevent you from having lung cancer treatment.  Breast Cancer  Practice breast self-awareness. This means understanding how your breasts normally appear and feel.  It also means doing regular breast self-exams. Let your health care provider know about any changes, no matter how small.  If you are in your 20s or 30s, you should have a clinical breast exam (CBE) by a health care provider every 1-3 years as part of a regular health exam.  If you are 40 or older, have a CBE every year. Also consider having a breast X-ray (mammogram) every year.  If you have a family history of breast cancer, talk to your health care provider about genetic screening.  If you   are at high risk for breast cancer, talk to your health care provider about having an MRI and a mammogram every year.  Breast cancer gene (BRCA) assessment is recommended for women who have family members with BRCA-related cancers. BRCA-related cancers include:  Breast.  Ovarian.  Tubal.  Peritoneal cancers.  Results of the assessment will determine the need for genetic counseling and BRCA1 and BRCA2 testing. Cervical Cancer Your health care provider may recommend that you be screened regularly for cancer of the pelvic organs (ovaries, uterus, and  vagina). This screening involves a pelvic examination, including checking for microscopic changes to the surface of your cervix (Pap test). You may be encouraged to have this screening done every 3 years, beginning at age 21.  For women ages 30-65, health care providers may recommend pelvic exams and Pap testing every 3 years, or they may recommend the Pap and pelvic exam, combined with testing for human papilloma virus (HPV), every 5 years. Some types of HPV increase your risk of cervical cancer. Testing for HPV may also be done on women of any age with unclear Pap test results.  Other health care providers may not recommend any screening for nonpregnant women who are considered low risk for pelvic cancer and who do not have symptoms. Ask your health care provider if a screening pelvic exam is right for you.  If you have had past treatment for cervical cancer or a condition that could lead to cancer, you need Pap tests and screening for cancer for at least 20 years after your treatment. If Pap tests have been discontinued, your risk factors (such as having a new sexual partner) need to be reassessed to determine if screening should resume. Some women have medical problems that increase the chance of getting cervical cancer. In these cases, your health care provider may recommend more frequent screening and Pap tests. Colorectal Cancer  This type of cancer can be detected and often prevented.  Routine colorectal cancer screening usually begins at 62 years of age and continues through 62 years of age.  Your health care provider may recommend screening at an earlier age if you have risk factors for colon cancer.  Your health care provider may also recommend using home test kits to check for hidden blood in the stool.  A small camera at the end of a tube can be used to examine your colon directly (sigmoidoscopy or colonoscopy). This is done to check for the earliest forms of colorectal  cancer.  Routine screening usually begins at age 50.  Direct examination of the colon should be repeated every 5-10 years through 62 years of age. However, you may need to be screened more often if early forms of precancerous polyps or small growths are found. Skin Cancer  Check your skin from head to toe regularly.  Tell your health care provider about any new moles or changes in moles, especially if there is a change in a mole's shape or color.  Also tell your health care provider if you have a mole that is larger than the size of a pencil eraser.  Always use sunscreen. Apply sunscreen liberally and repeatedly throughout the day.  Protect yourself by wearing long sleeves, pants, a wide-brimmed hat, and sunglasses whenever you are outside. HEART DISEASE, DIABETES, AND HIGH BLOOD PRESSURE   High blood pressure causes heart disease and increases the risk of stroke. High blood pressure is more likely to develop in:  People who have blood pressure in the high end   of the normal range (130-139/85-89 mm Hg).  People who are overweight or obese.  People who are African American.  If you are 38-23 years of age, have your blood pressure checked every 3-5 years. If you are 61 years of age or older, have your blood pressure checked every year. You should have your blood pressure measured twice--once when you are at a hospital or clinic, and once when you are not at a hospital or clinic. Record the average of the two measurements. To check your blood pressure when you are not at a hospital or clinic, you can use:  An automated blood pressure machine at a pharmacy.  A home blood pressure monitor.  If you are between 45 years and 39 years old, ask your health care provider if you should take aspirin to prevent strokes.  Have regular diabetes screenings. This involves taking a blood sample to check your fasting blood sugar level.  If you are at a normal weight and have a low risk for diabetes,  have this test once every three years after 62 years of age.  If you are overweight and have a high risk for diabetes, consider being tested at a younger age or more often. PREVENTING INFECTION  Hepatitis B  If you have a higher risk for hepatitis B, you should be screened for this virus. You are considered at high risk for hepatitis B if:  You were born in a country where hepatitis B is common. Ask your health care provider which countries are considered high risk.  Your parents were born in a high-risk country, and you have not been immunized against hepatitis B (hepatitis B vaccine).  You have HIV or AIDS.  You use needles to inject street drugs.  You live with someone who has hepatitis B.  You have had sex with someone who has hepatitis B.  You get hemodialysis treatment.  You take certain medicines for conditions, including cancer, organ transplantation, and autoimmune conditions. Hepatitis C  Blood testing is recommended for:  Everyone born from [birth year] through 1967.  Anyone with known risk factors for hepatitis C. Sexually transmitted infections (STIs)  You should be screened for sexually transmitted infections (STIs) including gonorrhea and chlamydia if:  You are sexually active and are younger than 62 years of age.  You are older than 62 years of age and your health care provider tells you that you are at risk for this type of infection.  Your sexual activity has changed since you were last screened and you are at an increased risk for chlamydia or gonorrhea. Ask your health care provider if you are at risk.  If you do not have HIV, but are at risk, it may be recommended that you take a prescription medicine daily to prevent HIV infection. This is called pre-exposure prophylaxis (PrEP). You are considered at risk if:  You are sexually active and do not regularly use condoms or know the HIV status of your partner(s).  You take drugs by injection.  You are sexually  active with a partner who has HIV. Talk with your health care provider about whether you are at high risk of being infected with HIV. If you choose to begin PrEP, you should first be tested for HIV. You should then be tested every 3 months for as long as you are taking PrEP.  PREGNANCY   If you are premenopausal and you may become pregnant, ask your health care provider about preconception counseling.  If you may  become pregnant, take 400 to 800 micrograms (mcg) of folic acid every day.  If you want to prevent pregnancy, talk to your health care provider about birth control (contraception). OSTEOPOROSIS AND MENOPAUSE   Osteoporosis is a disease in which the bones lose minerals and strength with aging. This can result in serious bone fractures. Your risk for osteoporosis can be identified using a bone density scan.  If you are 61 years of age or older, or if you are at risk for osteoporosis and fractures, ask your health care provider if you should be screened.  Ask your health care provider whether you should take a calcium or vitamin D supplement to lower your risk for osteoporosis.  Menopause may have certain physical symptoms and risks.  Hormone replacement therapy may reduce some of these symptoms and risks. Talk to your health care provider about whether hormone replacement therapy is right for you.  HOME CARE INSTRUCTIONS   Schedule regular health, dental, and eye exams.  Stay current with your immunizations.   Do not use any tobacco products including cigarettes, chewing tobacco, or electronic cigarettes.  If you are pregnant, do not drink alcohol.  If you are breastfeeding, limit how much and how often you drink alcohol.  Limit alcohol intake to no more than 1 drink per day for nonpregnant women. One drink equals 12 ounces of beer, 5 ounces of wine, or 1 ounces of hard liquor.  Do not use street drugs.  Do not share needles.  Ask your health care provider for help if  you need support or information about quitting drugs.  Tell your health care provider if you often feel depressed.  Tell your health care provider if you have ever been abused or do not feel safe at home.   This information is not intended to replace advice given to you by your health care provider. Make sure you discuss any questions you have with your health care provider.   Document Released: 02/03/2011 Document Revised: 08/11/2014 Document Reviewed: 06/22/2013 Elsevier Interactive Patient Education [employer].

## 2016-01-12 LAB — HIV ANTIBODY (ROUTINE TESTING W REFLEX): HIV 1&2 Ab, 4th Generation: NONREACTIVE

## 2016-01-12 LAB — HEPATITIS C ANTIBODY: HCV AB: NEGATIVE

## 2016-01-16 LAB — IPS PAP TEST WITH HPV

## 2016-02-18 ENCOUNTER — Telehealth: Payer: Self-pay | Admitting: Obstetrics and Gynecology

## 2016-02-18 DIAGNOSIS — Z79890 Hormone replacement therapy (postmenopausal): Secondary | ICD-10-CM

## 2016-02-18 DIAGNOSIS — Z803 Family history of malignant neoplasm of breast: Secondary | ICD-10-CM

## 2016-02-18 MED ORDER — CONJ ESTROGENS-BAZEDOXIFENE 0.45-20 MG PO TABS: 1.0000 | ORAL_TABLET | Freq: Every day | ORAL | Status: DC

## 2016-02-18 NOTE — Telephone Encounter (Signed)
Call to patient and she is advised of message from Dr. Quincy Simmonds.  She will pick up rx and call back with any concerns.  Routing to provider for final review. Patient agreeable to disposition. Will close encounter.

## 2016-02-18 NOTE — Telephone Encounter (Signed)
Patient would like to discuss getting back on HRT.

## 2016-02-18 NOTE — Telephone Encounter (Signed)
Patient last seen for annual exam with Dr. Quincy Simmonds 01/11/16. Had stopped [facility] a few days before and was not having symptoms. About 1 week after annual exam she began to develop increased hot flashes. Up to five or 6 per day. Interrupting sleep.   Has mammogram scheduled for 03/06/16 at [facility] at 12:00.  Would like to restart Duavee.  Kristopher Oppen[address] on [address].

## 2016-02-18 NOTE — Telephone Encounter (Signed)
OK to refill [facility] for one year to pharmacy of choice. We have reviewed risks and benefits.

## 2016-03-18 ENCOUNTER — Encounter: Payer: Self-pay | Admitting: Obstetrics and Gynecology

## 2017-01-12 ENCOUNTER — Other Ambulatory Visit (HOSPITAL_COMMUNITY)
Admission: RE | Admit: 2017-01-12 | Discharge: 2017-01-12 | Disposition: A | Discharge status: Home / Self Care | Payer: PRIVATE HEALTH INSURANCE | Source: Ambulatory Visit | Attending: Obstetrics and Gynecology | Admitting: Obstetrics and Gynecology

## 2017-01-12 ENCOUNTER — Ambulatory Visit (INDEPENDENT_AMBULATORY_CARE_PROVIDER_SITE_OTHER): Payer: No Typology Code available for payment source | Admitting: Obstetrics and Gynecology

## 2017-01-12 ENCOUNTER — Encounter: Payer: Self-pay | Admitting: Obstetrics and Gynecology

## 2017-01-12 VITALS — BP 116/70 | HR 60 | Resp 16 | Ht 63.0 in | Wt 143.0 lb

## 2017-01-12 DIAGNOSIS — Z803 Family history of malignant neoplasm of breast: Secondary | ICD-10-CM

## 2017-01-12 DIAGNOSIS — Z79890 Hormone replacement therapy (postmenopausal): Secondary | ICD-10-CM

## 2017-01-12 DIAGNOSIS — Z01419 Encounter for gynecological examination (general) (routine) without abnormal findings: Secondary | ICD-10-CM

## 2017-01-12 MED ORDER — CONJ ESTROGENS-BAZEDOXIFENE 0.45-20 MG PO TABS: 1.0000 | ORAL_TABLET | Freq: Every day | ORAL | 3 refills | Status: DC

## 2017-01-12 NOTE — Patient Instructions (Signed)

## 2017-01-12 NOTE — Progress Notes (Signed)
61 y.o. G1P0 Married Caucasian female here for annual exam.    Back on Duavee.  Tried to stop and had hot flashes and felt she had decreased memory.   Has elevated blood sugar with HgbA1C is prediabetic. Followed every 6 months through PCP.  PCP: Leighton Ruff, MD  No LMP recorded. Patient is postmenopausal.           Sexually active: Yes.    The current method of family planning is post menopausal status.    Exercising: Yes.    Cardio/Strength training Smoker:  no  Health Maintenance: Pap:  01/11/16 Pap and HR HPV negative   01-04-15 Neg:Neg HR HPV.  Hx DES exposure History of abnormal Pap:  Yes, hx of colposcopy but no teatment to cervix MMG:  03/06/16 BIRADS 1 negative Colonoscopy:  April 2018 done at [facility] -- Dr. Oletta Lamas, per patient normal f/u 10 years BMD:   03/13/15  Result  Normal: [facility] TDaP:  Up to date with PCP per patient Gardasil:   n/a HIV: 01/11/16 Negative Hep C: 01/11/16 Negative Screening Labs:  Hb today: PCP   reports that she quit smoking about 41 years ago. She has never used smokeless tobacco. She reports that she drinks about 0.6 oz of alcohol per week . She reports that she does not use drugs.  Past Medical History:  Diagnosis Date  . Family history of DES exposure   . Fibrocystic breast   . GERD (gastroesophageal reflux disease)   . HPV (human papilloma virus) infection   . OCD (obsessive compulsive disorder)   . S/P excision of lipoma     Past Surgical History:  Procedure Laterality Date  . ANKLE SURGERY     broken as a child  . DILATION AND CURETTAGE OF UTERUS    . LIPOMA EXCISION     rib cage    Current Outpatient Prescriptions  Medication Sig Dispense Refill  . CALCIUM PO Take 500 mg by mouth 2 (two) times daily.    Marland Kitchen Conj Estrogens-Bazedoxifene (DUAVEE) 0.45-20 MG TABS Take 1 tablet by mouth daily. 90 tablet 3  . Dexlansoprazole (DEXILANT PO) Take 60 mg by mouth daily.    Marland Kitchen escitalopram (LEXAPRO) 10 MG tablet Take 10 mg by mouth daily.     . ranitidine (ZANTAC) 150 MG tablet Take 150 mg by mouth as needed for heartburn.    . Saccharomyces boulardii (FLORASTOR PO) Take 1 capsule by mouth daily.     No current facility-administered medications for this visit.     Family History  Problem Relation Age of Onset  . Breast cancer Mother   . Osteoporosis Maternal Grandmother   . Stroke Maternal Grandfather     ROS:  Pertinent items are noted in HPI.  Otherwise, a comprehensive ROS was negative.  Exam:   BP 116/70 (BP Location: Right Arm, Patient Position: Sitting, Cuff Size: Normal)   Pulse 60   Resp 16   Ht 5\' 3"  (1.6 m)   Wt 143 lb (64.9 kg)   BMI 25.33 kg/m     General appearance: alert, cooperative and appears stated age Head: Normocephalic, without obvious abnormality, atraumatic Neck: no adenopathy, supple, symmetrical, trachea midline and thyroid normal to inspection and palpation Lungs: clear to auscultation bilaterally Breasts: normal appearance, no masses or tenderness, No nipple retraction or dimpling, No nipple discharge or bleeding, No axillary or supraclavicular adenopathy Heart: regular rate and rhythm Abdomen: soft, non-tender; no masses, no organomegaly Extremities: extremities normal, atraumatic, no cyanosis or  edema Skin: Skin color, texture, turgor normal. No rashes or lesions Lymph nodes: Cervical, supraclavicular, and axillary nodes normal. No abnormal inguinal nodes palpated Neurologic: Grossly normal  Pelvic: External genitalia:  no lesions              Urethra:  normal appearing urethra with no masses, tenderness or lesions              Bartholins and Skenes: normal                 Vagina: normal appearing vagina with normal color and discharge, no lesions              Cervix: no lesions              Pap taken: Yes.   Bimanual Exam:  Uterus:  normal size, contour, position, consistency, mobility, non-tender              Adnexa: no mass, fullness, tenderness              Rectal exam: Yes.   .  Confirms.              Anus:  normal sphincter tone, hemorrhoid noted.  (Not bothersome per patient.) Chaperone was present for exam.  Assessment:   Well woman visit with normal exam. Hx DES exposure.  FH of breast cancer.  Genetic testing negative in her mother.  Elevated HgbA1C.  Plan: Mammogram screening discussed. Recommended self breast awareness. Pap and HR HPV as above. Guidelines for Calcium, Vitamin D, regular exercise program including cardiovascular and weight bearing exercise. Discussed Duavee and benefits of treating menopausal vasomotor symptoms and protecting bone.  She wishes to continue.  Refill for one year.  Follow up annually and prn.    After visit summary provided.

## 2017-01-14 LAB — CYTOLOGY - PAP
Diagnosis: NEGATIVE
HPV: NOT DETECTED

## 2017-04-28 ENCOUNTER — Encounter: Payer: Self-pay | Admitting: Obstetrics and Gynecology

## 2017-08-04 DIAGNOSIS — R7309 Other abnormal glucose: Secondary | ICD-10-CM

## 2017-08-04 HISTORY — DX: Other abnormal glucose: R73.09

## 2018-01-15 ENCOUNTER — Ambulatory Visit: Payer: No Typology Code available for payment source | Admitting: Obstetrics and Gynecology

## 2018-01-18 ENCOUNTER — Encounter: Payer: Self-pay | Admitting: Obstetrics and Gynecology

## 2018-01-18 ENCOUNTER — Ambulatory Visit: Payer: No Typology Code available for payment source | Admitting: Obstetrics and Gynecology

## 2018-01-18 ENCOUNTER — Other Ambulatory Visit: Payer: Self-pay

## 2018-01-18 ENCOUNTER — Other Ambulatory Visit (HOSPITAL_COMMUNITY)
Admission: RE | Admit: 2018-01-18 | Discharge: 2018-01-18 | Disposition: A | Discharge status: Home / Self Care | Payer: PRIVATE HEALTH INSURANCE | Source: Ambulatory Visit | Attending: Obstetrics and Gynecology | Admitting: Obstetrics and Gynecology

## 2018-01-18 VITALS — BP 108/60 | HR 60 | Resp 14 | Ht 63.0 in | Wt 147.4 lb

## 2018-01-18 DIAGNOSIS — Z79890 Hormone replacement therapy (postmenopausal): Secondary | ICD-10-CM | POA: Diagnosis not present

## 2018-01-18 DIAGNOSIS — Z01419 Encounter for gynecological examination (general) (routine) without abnormal findings: Secondary | ICD-10-CM | POA: Diagnosis not present

## 2018-01-18 DIAGNOSIS — Z803 Family history of malignant neoplasm of breast: Secondary | ICD-10-CM | POA: Diagnosis not present

## 2018-01-18 MED ORDER — CONJ ESTROGENS-BAZEDOXIFENE 0.45-20 MG PO TABS
1.0000 | ORAL_TABLET | Freq: Every day | ORAL | 3 refills | Status: DC
Start: 2018-01-18 — End: 2019-02-07

## 2018-01-18 NOTE — Patient Instructions (Signed)

## 2018-01-18 NOTE — Progress Notes (Signed)
62 y.o. G1P0 Married Caucasian female here for annual exam.    On Duavee.  No hot flashes.   On Lexapro for 10 years.   Last hemoglobin A1C was about 5.8.  States cholesterol is above 200 because of her good cholesterol.  PCP:  Leighton Ruff, MD   Patient's last menstrual period was 08/04/2002 (approximate).           Sexually active: Yes.    The current method of family planning is post menopausal status.    Exercising: Yes.    fitness instructor--walks, spins, etc.  Teaching once a week.  Smoker:  no  Health Maintenance: Pap:   01/11/16 Pap and HR HPV negative             01-04-15 Neg:Neg HR HPV. Hx DES exposure History of abnormal Pap:  Yes, hx of colposcopy but no teatment to cervix MMG: 03-09-17 3D/Density C/Neg/BiRads1 Colonoscopy:  11/2016 normal;next 10 years BMD:   03-13-15  Result  normal TDaP:  PCP Gardasil:   no HIV: 01-11-16 neg Hep C: 01-11-16 Neg Screening Labs:  Hb today: PCP, Urine today: not done   reports that she quit smoking about 42 years ago. She has never used smokeless tobacco. She reports that she drinks about 0.6 oz of alcohol per week. She reports that she does not use drugs.  Past Medical History:  Diagnosis Date  . Family history of DES exposure   . Fibrocystic breast   . GERD (gastroesophageal reflux disease)   . HPV (human papilloma virus) infection   . OCD (obsessive compulsive disorder)   . S/P excision of lipoma     Past Surgical History:  Procedure Laterality Date  . ANKLE SURGERY     broken as a child  . DILATION AND CURETTAGE OF UTERUS    . LIPOMA EXCISION     rib cage    Current Outpatient Medications  Medication Sig Dispense Refill  . CALCIUM PO Take 500 mg by mouth 2 (two) times daily.    Marland Kitchen Conj Estrogens-Bazedoxifene (DUAVEE) 0.45-20 MG TABS Take 1 tablet by mouth daily. 90 tablet 3  . Dexlansoprazole (DEXILANT PO) Take 60 mg by mouth. Taking Mon, Wed, Fri    . escitalopram (LEXAPRO) 10 MG tablet Take 10 mg by mouth daily.     . ranitidine (ZANTAC) 150 MG tablet Take 150 mg by mouth as needed for heartburn.    . Saccharomyces boulardii (FLORASTOR PO) Take 1 capsule by mouth daily.     No current facility-administered medications for this visit.     Family History  Problem Relation Age of Onset  . Breast cancer Mother   . Osteoporosis Maternal Grandmother   . Stroke Maternal Grandfather     Review of Systems  Constitutional: Negative.   HENT: Negative.   Eyes: Negative.   Respiratory: Negative.   Cardiovascular: Negative.   Gastrointestinal: Negative.   Endocrine: Negative.   Genitourinary: Negative.   Musculoskeletal: Negative.   Skin: Negative.   Allergic/Immunologic: Negative.   Neurological: Negative.   Hematological: Negative.   Psychiatric/Behavioral: Negative.     Exam:   BP 108/60 (BP Location: Right Arm, Patient Position: Sitting, Cuff Size: Normal)   Pulse 60   Resp 14   Ht 5\' 3"  (1.6 m)   Wt 147 lb 6.4 oz (66.9 kg)   LMP 08/04/2002 (Approximate)   BMI 26.11 kg/m     General appearance: alert, cooperative and appears stated age Head: Normocephalic, without obvious abnormality, atraumatic Neck:  no adenopathy, supple, symmetrical, trachea midline and thyroid normal to inspection and palpation Lungs: clear to auscultation bilaterally Breasts: normal appearance, no masses or tenderness, No nipple retraction or dimpling, No nipple discharge or bleeding, No axillary or supraclavicular adenopathy Heart: regular rate and rhythm Abdomen: soft, non-tender; no masses, no organomegaly Extremities: extremities normal, atraumatic, no cyanosis or edema Skin: Skin color, texture, turgor normal. No rashes or lesions Lymph nodes: Cervical, supraclavicular, and axillary nodes normal. No abnormal inguinal nodes palpated Neurologic: Grossly normal  Pelvic: External genitalia:  no lesions              Urethra:  normal appearing urethra with no masses, tenderness or lesions              Bartholins  and Skenes: normal                 Vagina: normal appearing vagina with normal color and discharge, no lesions              Cervix: no lesions.  Cervical stenosis.               Pap taken: Yes.   Bimanual Exam:  Uterus:  normal size, contour, position, consistency, mobility, non-tender              Adnexa: no mass, fullness, tenderness              Rectal exam: Yes.  .  Confirms.              Anus:  normal sphincter tone, no lesions  Chaperone was present for exam.  Assessment:   Well woman visit with normal exam. Hx DES exposure.  FH of breast cancer.  Genetic testing negative in her mother.  Elevated HgbA1C.  Plan: Mammogram screening. Recommended self breast awareness. Pap and HR HPV as above. Guidelines for Calcium, Vitamin D, regular exercise program including cardiovascular and weight bearing exercise. Continue Duavee.  We discussed risks of DVT, PE, MI, and stroke. Will have her July labs from her PCP sent to me. Follow up annually and prn.   After visit summary provided.

## 2018-01-20 LAB — CYTOLOGY - PAP
DIAGNOSIS: NEGATIVE
HPV: NOT DETECTED

## 2018-03-04 ENCOUNTER — Encounter: Payer: Self-pay | Admitting: Infectious Diseases

## 2018-03-15 ENCOUNTER — Encounter: Payer: Self-pay | Admitting: Obstetrics and Gynecology

## 2018-03-30 ENCOUNTER — Encounter: Payer: Self-pay | Admitting: Obstetrics and Gynecology

## 2018-09-20 DIAGNOSIS — L814 Other melanin hyperpigmentation: Secondary | ICD-10-CM | POA: Diagnosis not present

## 2018-09-20 DIAGNOSIS — D225 Melanocytic nevi of trunk: Secondary | ICD-10-CM | POA: Diagnosis not present

## 2018-09-20 DIAGNOSIS — D2271 Melanocytic nevi of right lower limb, including hip: Secondary | ICD-10-CM | POA: Diagnosis not present

## 2018-09-20 DIAGNOSIS — L821 Other seborrheic keratosis: Secondary | ICD-10-CM | POA: Diagnosis not present

## 2018-10-19 DIAGNOSIS — L719 Rosacea, unspecified: Secondary | ICD-10-CM | POA: Diagnosis not present

## 2019-02-03 ENCOUNTER — Other Ambulatory Visit: Payer: Self-pay

## 2019-02-07 ENCOUNTER — Other Ambulatory Visit: Payer: Self-pay

## 2019-02-07 ENCOUNTER — Other Ambulatory Visit (HOSPITAL_COMMUNITY)
Admission: RE | Admit: 2019-02-07 | Discharge: 2019-02-07 | Disposition: A | Discharge status: Home / Self Care | Payer: BC Managed Care – PPO | Source: Ambulatory Visit | Attending: Obstetrics and Gynecology | Admitting: Obstetrics and Gynecology

## 2019-02-07 ENCOUNTER — Encounter: Payer: Self-pay | Admitting: Obstetrics and Gynecology

## 2019-02-07 ENCOUNTER — Ambulatory Visit: Payer: BC Managed Care – PPO | Admitting: Obstetrics and Gynecology

## 2019-02-07 VITALS — BP 128/72 | HR 68 | Temp 97.2°F | Resp 12 | Ht 63.25 in | Wt 143.8 lb

## 2019-02-07 DIAGNOSIS — Z01419 Encounter for gynecological examination (general) (routine) without abnormal findings: Secondary | ICD-10-CM | POA: Diagnosis not present

## 2019-02-07 NOTE — Progress Notes (Signed)
63 y.o. G1P0 Married Caucasian female here for annual exam.    Stopped the [facility] last October.  Having hot flashes, more in the evening.   PCP:  Leighton Ruff, MD  Patient's last menstrual period was 08/04/2002 (approximate).           Sexually active: Yes.    The current method of family planning is post menopausal status.    Exercising: Yes.    walking, strength training Smoker:  no  Health Maintenance: Pap: 01/18/18 Neg:Neg HR HPV 01/12/17 Neg:Neg HR HPV 01/11/16 Pap and HR HPV negative 01-04-15 Neg:Neg HR HPV. Hx DES exposure History of abnormal Pap:  Yes, hx of colpo but no treatment to cervix MMG:  03/15/18 BIRADS 2 benign/density c -- scheduled August 2020 Colonoscopy:  11/2016 normal;next 10 years BMD:   03/13/15  Result  Normal TDaP: December 2019 HIV and Hep C: 01/11/16 Negative Screening Labs:  PCP   reports that she quit smoking about 43 years ago. She has never used smokeless tobacco. She reports current alcohol use of about 1.0 standard drinks of alcohol per week. She reports that she does not use drugs.  Past Medical History:  Diagnosis Date  . Elevated hemoglobin A1c 2019   5.7  . Family history of DES exposure   . Fibrocystic breast   . GERD (gastroesophageal reflux disease)   . HPV (human papilloma virus) infection   . OCD (obsessive compulsive disorder)   . S/P excision of lipoma     Past Surgical History:  Procedure Laterality Date  . ANKLE SURGERY     broken as a child  . DILATION AND CURETTAGE OF UTERUS    . LIPOMA EXCISION     rib cage    Current Outpatient Medications  Medication Sig Dispense Refill  . CALCIUM PO Take 500 mg by mouth 2 (two) times daily.    Marland Kitchen Dexlansoprazole (DEXILANT PO) Take 60 mg by mouth. Taking Mon, Wed, Fri    . escitalopram (LEXAPRO) 10 MG tablet Take 10 mg by mouth daily.    . Saccharomyces boulardii (FLORASTOR PO) Take 1 capsule by mouth daily.     No current facility-administered medications for this  visit.     Family History  Problem Relation Age of Onset  . Breast cancer Mother   . Osteoporosis Maternal Grandmother   . Stroke Maternal Grandfather     Review of Systems  Constitutional: Negative.   HENT: Negative.   Eyes: Negative.   Respiratory: Negative.   Cardiovascular: Negative.   Gastrointestinal: Negative.   Endocrine: Negative.   Genitourinary: Negative.   Musculoskeletal: Negative.   Skin: Negative.   Allergic/Immunologic: Negative.   Neurological: Negative.   Hematological: Negative.   Psychiatric/Behavioral: Negative.     Exam:   BP 128/72 (BP Location: Left Arm, Patient Position: Sitting, Cuff Size: Normal)   Pulse 68   Temp (!) 97.2 F (36.2 C) (Temporal)   Resp 12   Ht 5' 3.25" (1.607 m)   Wt 143 lb 12.8 oz (65.2 kg)   LMP 08/04/2002 (Approximate)   BMI 25.27 kg/m     General appearance: alert, cooperative and appears stated age Head: normocephalic, without obvious abnormality, atraumatic Neck: no adenopathy, supple, symmetrical, trachea midline and thyroid normal to inspection and palpation Lungs: clear to auscultation bilaterally Breasts: normal appearance, no masses or tenderness, No nipple retraction or dimpling, No nipple discharge or bleeding, No axillary adenopathy Heart: regular rate and rhythm Abdomen: soft, non-tender; no masses, no organomegaly Extremities:  extremities normal, atraumatic, no cyanosis or edema Skin: skin color, texture, turgor normal. No rashes or lesions Lymph nodes: cervical, supraclavicular, and axillary nodes normal. Neurologic: grossly normal  Pelvic: External genitalia:  no lesions              No abnormal inguinal nodes palpated.              Urethra:  normal appearing urethra with no masses, tenderness or lesions              Bartholins and Skenes: normal                 Vagina: normal appearing vagina with normal color and discharge, no lesions              Cervix: no lesions              Pap taken:  No. Bimanual Exam:  Uterus:  normal size, contour, position, consistency, mobility, non-tender              Adnexa: no mass, fullness, tenderness              Rectal exam: Yes.  .  Confirms.              Anus:  normal sphincter tone, no lesions  Chaperone was present for exam.  Assessment:   Well woman visit with normal exam. Hx DES exposure.  FH of breast cancer. Genetic testing negative in her mother.  Elevated HgbA1C. Off [place].  Menopausal symptoms.  Plan: Mammogram screening discussed. Self breast awareness reviewed. Pap and HR HPV as above. Guidelines for Calcium, Vitamin D, regular exercise program including cardiovascular and weight bearing exercise. Will do labs with PCP and get refill of her Lexapro there.  Menopause brochure.  We discussed options for tx of symptoms - Neurontin, switch to another SSI or SNRI. She declines.  I also discussed vaginal lubricants.  Follow up annually and prn.  After visit summary provided.

## 2019-02-09 LAB — CYTOLOGY - PAP
Diagnosis: NEGATIVE
HPV: NOT DETECTED

## 2019-03-21 ENCOUNTER — Encounter: Payer: Self-pay | Admitting: Obstetrics and Gynecology

## 2019-03-21 DIAGNOSIS — Z803 Family history of malignant neoplasm of breast: Secondary | ICD-10-CM | POA: Diagnosis not present

## 2019-03-21 DIAGNOSIS — M25561 Pain in right knee: Secondary | ICD-10-CM | POA: Diagnosis not present

## 2019-03-21 DIAGNOSIS — M1711 Unilateral primary osteoarthritis, right knee: Secondary | ICD-10-CM | POA: Diagnosis not present

## 2019-03-21 DIAGNOSIS — Z1231 Encounter for screening mammogram for malignant neoplasm of breast: Secondary | ICD-10-CM | POA: Diagnosis not present

## 2019-05-02 DIAGNOSIS — Z23 Encounter for immunization: Secondary | ICD-10-CM | POA: Diagnosis not present

## 2019-05-03 DIAGNOSIS — M25561 Pain in right knee: Secondary | ICD-10-CM | POA: Diagnosis not present

## 2019-05-03 DIAGNOSIS — M25461 Effusion, right knee: Secondary | ICD-10-CM | POA: Diagnosis not present

## 2019-05-12 DIAGNOSIS — M25561 Pain in right knee: Secondary | ICD-10-CM | POA: Diagnosis not present

## 2019-05-16 DIAGNOSIS — M25561 Pain in right knee: Secondary | ICD-10-CM | POA: Diagnosis not present

## 2019-05-16 DIAGNOSIS — M25461 Effusion, right knee: Secondary | ICD-10-CM | POA: Diagnosis not present

## 2019-05-16 DIAGNOSIS — M1711 Unilateral primary osteoarthritis, right knee: Secondary | ICD-10-CM | POA: Diagnosis not present

## 2019-05-19 DIAGNOSIS — H6590 Unspecified nonsuppurative otitis media, unspecified ear: Secondary | ICD-10-CM | POA: Diagnosis not present

## 2019-05-19 DIAGNOSIS — H8109 Meniere's disease, unspecified ear: Secondary | ICD-10-CM | POA: Diagnosis not present

## 2019-05-19 DIAGNOSIS — K219 Gastro-esophageal reflux disease without esophagitis: Secondary | ICD-10-CM | POA: Diagnosis not present

## 2019-05-19 DIAGNOSIS — R7303 Prediabetes: Secondary | ICD-10-CM | POA: Diagnosis not present

## 2019-05-19 DIAGNOSIS — E7800 Pure hypercholesterolemia, unspecified: Secondary | ICD-10-CM | POA: Diagnosis not present

## 2019-06-01 DIAGNOSIS — M25461 Effusion, right knee: Secondary | ICD-10-CM | POA: Diagnosis not present

## 2019-06-01 DIAGNOSIS — M1711 Unilateral primary osteoarthritis, right knee: Secondary | ICD-10-CM | POA: Diagnosis not present

## 2019-06-08 DIAGNOSIS — M1711 Unilateral primary osteoarthritis, right knee: Secondary | ICD-10-CM | POA: Diagnosis not present

## 2019-06-15 DIAGNOSIS — M25561 Pain in right knee: Secondary | ICD-10-CM | POA: Diagnosis not present

## 2019-06-15 DIAGNOSIS — M1711 Unilateral primary osteoarthritis, right knee: Secondary | ICD-10-CM | POA: Diagnosis not present

## 2019-06-20 DIAGNOSIS — D72820 Lymphocytosis (symptomatic): Secondary | ICD-10-CM | POA: Diagnosis not present

## 2019-06-24 DIAGNOSIS — Z20828 Contact with and (suspected) exposure to other viral communicable diseases: Secondary | ICD-10-CM | POA: Diagnosis not present

## 2019-08-02 DIAGNOSIS — Z23 Encounter for immunization: Secondary | ICD-10-CM | POA: Diagnosis not present

## 2019-08-24 ENCOUNTER — Ambulatory Visit: Payer: PPO | Attending: Internal Medicine

## 2019-08-24 DIAGNOSIS — Z23 Encounter for immunization: Secondary | ICD-10-CM | POA: Insufficient documentation

## 2019-08-24 NOTE — Progress Notes (Signed)
   Covid-19 Vaccination Clinic  Name:  Ashley Blair    MRN: [id number] DOB: 1955/12/06  08/24/2019  [name] was observed post Covid-19 immunization for 15 minutes without incidence. She was provided with Vaccine Information Sheet and instruction to access the V-Safe system.   [name] was instructed to call 911 with any severe reactions post vaccine: Marland Kitchen Difficulty breathing  . Swelling of your face and throat  . A fast heartbeat  . A bad rash all over your body  . Dizziness and weakness    Immunizations Administered    Name Date Dose VIS Date Route   Pfizer COVID-19 Vaccine 08/24/2019  4:14 PM 0.3 mL 07/15/2019 Intramuscular   Manufacturer: [place]   Lot: [id number]   [place]: [id number]

## 2019-09-14 ENCOUNTER — Ambulatory Visit: Payer: PPO | Attending: Internal Medicine

## 2019-09-14 DIAGNOSIS — Z23 Encounter for immunization: Secondary | ICD-10-CM

## 2019-09-14 NOTE — Progress Notes (Signed)
   Covid-19 Vaccination Clinic  Name:  Kamariana Cabral    MRN: [id number] DOB: [birth year]  09/14/2019  Ms. Parrilli was observed post Covid-19 immunization for 15 minutes without incidence. She was provided with Vaccine Information Sheet and instruction to access the V-Safe system.   Ms. Natter was instructed to call 911 with any severe reactions post vaccine: Marland Kitchen Difficulty breathing  . Swelling of your face and throat  . A fast heartbeat  . A bad rash all over your body  . Dizziness and weakness    Immunizations Administered    Name Date Dose VIS Date Route   Pfizer COVID-19 Vaccine 09/14/2019  2:29 PM 0.3 mL 07/15/2019 Intramuscular   Manufacturer: [place]   Lot: [id number]   [place]: [id number]

## 2019-09-26 DIAGNOSIS — D2271 Melanocytic nevi of right lower limb, including hip: Secondary | ICD-10-CM | POA: Diagnosis not present

## 2019-09-26 DIAGNOSIS — L720 Epidermal cyst: Secondary | ICD-10-CM | POA: Diagnosis not present

## 2019-09-26 DIAGNOSIS — L821 Other seborrheic keratosis: Secondary | ICD-10-CM | POA: Diagnosis not present

## 2019-09-26 DIAGNOSIS — D1801 Hemangioma of skin and subcutaneous tissue: Secondary | ICD-10-CM | POA: Diagnosis not present

## 2019-09-26 DIAGNOSIS — L814 Other melanin hyperpigmentation: Secondary | ICD-10-CM | POA: Diagnosis not present

## 2019-09-26 DIAGNOSIS — D2272 Melanocytic nevi of left lower limb, including hip: Secondary | ICD-10-CM | POA: Diagnosis not present

## 2019-09-26 DIAGNOSIS — D2262 Melanocytic nevi of left upper limb, including shoulder: Secondary | ICD-10-CM | POA: Diagnosis not present

## 2019-09-26 DIAGNOSIS — D225 Melanocytic nevi of trunk: Secondary | ICD-10-CM | POA: Diagnosis not present

## 2019-09-26 DIAGNOSIS — L309 Dermatitis, unspecified: Secondary | ICD-10-CM | POA: Diagnosis not present

## 2019-09-26 DIAGNOSIS — Z23 Encounter for immunization: Secondary | ICD-10-CM | POA: Diagnosis not present

## 2019-09-26 DIAGNOSIS — L578 Other skin changes due to chronic exposure to nonionizing radiation: Secondary | ICD-10-CM | POA: Diagnosis not present

## 2019-10-31 DIAGNOSIS — E7800 Pure hypercholesterolemia, unspecified: Secondary | ICD-10-CM | POA: Diagnosis not present

## 2019-10-31 DIAGNOSIS — F419 Anxiety disorder, unspecified: Secondary | ICD-10-CM | POA: Diagnosis not present

## 2019-10-31 DIAGNOSIS — R7303 Prediabetes: Secondary | ICD-10-CM | POA: Diagnosis not present

## 2019-10-31 DIAGNOSIS — K219 Gastro-esophageal reflux disease without esophagitis: Secondary | ICD-10-CM | POA: Diagnosis not present

## 2019-12-09 DIAGNOSIS — E7800 Pure hypercholesterolemia, unspecified: Secondary | ICD-10-CM | POA: Diagnosis not present

## 2019-12-09 DIAGNOSIS — F419 Anxiety disorder, unspecified: Secondary | ICD-10-CM | POA: Diagnosis not present

## 2019-12-09 DIAGNOSIS — R7303 Prediabetes: Secondary | ICD-10-CM | POA: Diagnosis not present

## 2019-12-09 DIAGNOSIS — K219 Gastro-esophageal reflux disease without esophagitis: Secondary | ICD-10-CM | POA: Diagnosis not present

## 2020-02-09 NOTE — Progress Notes (Signed)
64 y.o. G1P0 Married Caucasian female here for annual exam.    Some hot flashes, more infrequent.  They are manageable.   Vaccinated against Covid, finished in February.  Having elevated cholesterol and is due for a recheck.   PCP:  Leighton Ruff, MD   Patient's last menstrual period was 08/04/2002 (approximate).           Sexually active: Yes.    The current method of family planning is post menopausal status.    Exercising: Yes.    walks 5 miles a day/5 days a week, pilates Smoker:  no  Health Maintenance: Pap:  Hx of DES exposure 02-07-19 Neg:Neg HR HPV, 01-18-18 Neg:Neg HR HPV, 01-12-17 Neg:Neg HR HPV History of abnormal Pap:  Yes, 2005 hx of colpo but no treatment to cervix MMG:  03-21-19 3D/Neg/density C/BiRads1 Colonoscopy: 11/2016 normal;next 10 years BMD: 03-13-15  Result :Normal TDaP:  07/2018 Gardasil:   no HIV: 01-11-16 NR Hep C: 01-11-16 Neg Screening Labs:  PCP.    reports that she quit smoking about 44 years ago. She has never used smokeless tobacco. She reports current alcohol use of about 1.0 standard drink of alcohol per week. She reports that she does not use drugs.  Past Medical History:  Diagnosis Date  . Elevated hemoglobin A1c 2019   5.7  . Family history of DES exposure   . Fibrocystic breast   . GERD (gastroesophageal reflux disease)   . HPV (human papilloma virus) infection   . Hypercholesterolemia   . OCD (obsessive compulsive disorder)   . S/P excision of lipoma     Past Surgical History:  Procedure Laterality Date  . ANKLE SURGERY     broken as a child  . DILATION AND CURETTAGE OF UTERUS    . LIPOMA EXCISION     rib cage    Current Outpatient Medications  Medication Sig Dispense Refill  . CALCIUM PO Take 500 mg by mouth 2 (two) times daily.    Marland Kitchen Dexlansoprazole (DEXILANT PO) Take 60 mg by mouth. Taking Mon, Wed, Fri    . escitalopram (LEXAPRO) 10 MG tablet Take 10 mg by mouth daily.    . Saccharomyces boulardii (FLORASTOR PO) Take 1  capsule by mouth daily.     No current facility-administered medications for this visit.    Family History  Problem Relation Age of Onset  . Breast cancer Mother   . Osteoporosis Maternal Grandmother   . Stroke Maternal Grandfather     Review of Systems  All other systems reviewed and are negative.   Exam:   BP 136/70   Pulse 66   Resp 16   Ht 5\' 3"  (1.6 m)   Wt 142 lb 6.4 oz (64.6 kg)   LMP 08/04/2002 (Approximate)   BMI 25.23 kg/m     General appearance: alert, cooperative and appears stated age Head: normocephalic, without obvious abnormality, atraumatic Neck: no adenopathy, supple, symmetrical, trachea midline and thyroid normal to inspection and palpation Lungs: clear to auscultation bilaterally Breasts: normal appearance, no masses or tenderness, No nipple retraction or dimpling, No nipple discharge or bleeding, No axillary adenopathy Heart: regular rate and rhythm Abdomen: soft, non-tender; no masses, no organomegaly Extremities: extremities normal, atraumatic, no cyanosis or edema Skin: skin color, texture, turgor normal. No rashes or lesions Lymph nodes: cervical, supraclavicular, and axillary nodes normal. Neurologic: grossly normal  Pelvic: External genitalia:  no lesions              No abnormal inguinal  nodes palpated.              Urethra:  normal appearing urethra with no masses, tenderness or lesions              Bartholins and Skenes: normal                 Vagina: normal appearing vagina with normal color and discharge, atrophy noted              Cervix: no lesions              Pap taken: Yes.   Bimanual Exam:  Uterus:  normal size, contour, position, consistency, mobility, non-tender              Adnexa: no mass, fullness, tenderness              Rectal exam: Yes.  .  Confirms.              Anus:  normal sphincter tone, no lesions  Chaperone was present for exam.  Assessment:   Well woman visit with normal exam. Hx DES exposure.  FH of breast  cancer. Genetic testing negative in her mother.  Hx elevated HgbA1C.  Plan: Mammogram screening discussed. Self breast awareness reviewed. Pap and HR HPV as above. Guidelines for Calcium, Vitamin D, regular exercise program including cardiovascular and weight bearing exercise. We discussed cooking oil or vaginal estrogen if needed for vaginal dryness/atrophy.  No Rx given today.  Labs with PCP. Follow up annually and prn.   After visit summary provided.

## 2020-02-13 ENCOUNTER — Other Ambulatory Visit: Payer: Self-pay

## 2020-02-13 ENCOUNTER — Encounter: Payer: Self-pay | Admitting: Obstetrics and Gynecology

## 2020-02-13 ENCOUNTER — Other Ambulatory Visit (HOSPITAL_COMMUNITY)
Admission: RE | Admit: 2020-02-13 | Discharge: 2020-02-13 | Disposition: A | Discharge status: Home / Self Care | Payer: PPO | Source: Ambulatory Visit | Attending: Obstetrics and Gynecology | Admitting: Obstetrics and Gynecology

## 2020-02-13 ENCOUNTER — Ambulatory Visit (INDEPENDENT_AMBULATORY_CARE_PROVIDER_SITE_OTHER): Payer: PPO | Admitting: Obstetrics and Gynecology

## 2020-02-13 VITALS — BP 136/70 | HR 66 | Resp 16 | Ht 63.0 in | Wt 142.4 lb

## 2020-02-13 DIAGNOSIS — Z01419 Encounter for gynecological examination (general) (routine) without abnormal findings: Secondary | ICD-10-CM

## 2020-02-13 DIAGNOSIS — Z9189 Other specified personal risk factors, not elsewhere classified: Secondary | ICD-10-CM

## 2020-02-13 NOTE — Patient Instructions (Signed)

## 2020-02-14 LAB — CYTOLOGY - PAP: Diagnosis: NEGATIVE

## 2020-03-08 DIAGNOSIS — F419 Anxiety disorder, unspecified: Secondary | ICD-10-CM | POA: Diagnosis not present

## 2020-03-08 DIAGNOSIS — K219 Gastro-esophageal reflux disease without esophagitis: Secondary | ICD-10-CM | POA: Diagnosis not present

## 2020-03-08 DIAGNOSIS — K589 Irritable bowel syndrome without diarrhea: Secondary | ICD-10-CM | POA: Diagnosis not present

## 2020-03-08 DIAGNOSIS — E7800 Pure hypercholesterolemia, unspecified: Secondary | ICD-10-CM | POA: Diagnosis not present

## 2020-03-08 DIAGNOSIS — R7303 Prediabetes: Secondary | ICD-10-CM | POA: Diagnosis not present

## 2020-03-26 DIAGNOSIS — Z1231 Encounter for screening mammogram for malignant neoplasm of breast: Secondary | ICD-10-CM | POA: Diagnosis not present

## 2020-04-06 ENCOUNTER — Encounter: Payer: Self-pay | Admitting: Obstetrics and Gynecology

## 2020-04-26 ENCOUNTER — Ambulatory Visit: Payer: PPO | Attending: Internal Medicine

## 2020-04-26 DIAGNOSIS — Z23 Encounter for immunization: Secondary | ICD-10-CM

## 2020-04-26 NOTE — Progress Notes (Signed)
   Covid-19 Vaccination Clinic  Name:  Milarose Savich    MRN: [id number] DOB: July 10, 1956  04/26/2020  Ms. Greenwalt was observed post Covid-19 immunization for 15 minutes without incident. She was provided with Vaccine Information Sheet and instruction to access the V-Safe system.   Ms. Spruell was instructed to call 911 with any severe reactions post vaccine: Marland Kitchen Difficulty breathing  . Swelling of face and throat  . A fast heartbeat  . A bad rash all over body  . Dizziness and weakness

## 2020-05-02 DIAGNOSIS — K589 Irritable bowel syndrome without diarrhea: Secondary | ICD-10-CM | POA: Diagnosis not present

## 2020-05-02 DIAGNOSIS — E7800 Pure hypercholesterolemia, unspecified: Secondary | ICD-10-CM | POA: Diagnosis not present

## 2020-05-02 DIAGNOSIS — R7303 Prediabetes: Secondary | ICD-10-CM | POA: Diagnosis not present

## 2020-05-02 DIAGNOSIS — F419 Anxiety disorder, unspecified: Secondary | ICD-10-CM | POA: Diagnosis not present

## 2020-05-02 DIAGNOSIS — K219 Gastro-esophageal reflux disease without esophagitis: Secondary | ICD-10-CM | POA: Diagnosis not present

## 2020-05-07 DIAGNOSIS — H524 Presbyopia: Secondary | ICD-10-CM | POA: Diagnosis not present

## 2020-05-07 DIAGNOSIS — H2513 Age-related nuclear cataract, bilateral: Secondary | ICD-10-CM | POA: Diagnosis not present

## 2020-05-07 DIAGNOSIS — H5203 Hypermetropia, bilateral: Secondary | ICD-10-CM | POA: Diagnosis not present

## 2020-10-01 DIAGNOSIS — D2272 Melanocytic nevi of left lower limb, including hip: Secondary | ICD-10-CM | POA: Diagnosis not present

## 2020-10-01 DIAGNOSIS — L309 Dermatitis, unspecified: Secondary | ICD-10-CM | POA: Diagnosis not present

## 2020-10-01 DIAGNOSIS — D2271 Melanocytic nevi of right lower limb, including hip: Secondary | ICD-10-CM | POA: Diagnosis not present

## 2020-10-01 DIAGNOSIS — D225 Melanocytic nevi of trunk: Secondary | ICD-10-CM | POA: Diagnosis not present

## 2020-10-01 DIAGNOSIS — L821 Other seborrheic keratosis: Secondary | ICD-10-CM | POA: Diagnosis not present

## 2020-10-01 DIAGNOSIS — L578 Other skin changes due to chronic exposure to nonionizing radiation: Secondary | ICD-10-CM | POA: Diagnosis not present

## 2020-10-01 DIAGNOSIS — L814 Other melanin hyperpigmentation: Secondary | ICD-10-CM | POA: Diagnosis not present

## 2020-10-01 DIAGNOSIS — D2262 Melanocytic nevi of left upper limb, including shoulder: Secondary | ICD-10-CM | POA: Diagnosis not present

## 2020-11-06 DIAGNOSIS — K219 Gastro-esophageal reflux disease without esophagitis: Secondary | ICD-10-CM | POA: Diagnosis not present

## 2020-11-06 DIAGNOSIS — E7800 Pure hypercholesterolemia, unspecified: Secondary | ICD-10-CM | POA: Diagnosis not present

## 2020-11-06 DIAGNOSIS — K589 Irritable bowel syndrome without diarrhea: Secondary | ICD-10-CM | POA: Diagnosis not present

## 2020-11-06 DIAGNOSIS — R7303 Prediabetes: Secondary | ICD-10-CM | POA: Diagnosis not present

## 2020-11-06 DIAGNOSIS — F419 Anxiety disorder, unspecified: Secondary | ICD-10-CM | POA: Diagnosis not present

## 2020-12-11 DIAGNOSIS — L309 Dermatitis, unspecified: Secondary | ICD-10-CM | POA: Diagnosis not present

## 2020-12-11 DIAGNOSIS — D692 Other nonthrombocytopenic purpura: Secondary | ICD-10-CM | POA: Diagnosis not present

## 2020-12-12 DIAGNOSIS — M542 Cervicalgia: Secondary | ICD-10-CM | POA: Diagnosis not present

## 2020-12-14 DIAGNOSIS — M542 Cervicalgia: Secondary | ICD-10-CM | POA: Diagnosis not present

## 2020-12-17 DIAGNOSIS — M542 Cervicalgia: Secondary | ICD-10-CM | POA: Diagnosis not present

## 2020-12-19 DIAGNOSIS — M542 Cervicalgia: Secondary | ICD-10-CM | POA: Diagnosis not present

## 2021-01-02 DIAGNOSIS — M542 Cervicalgia: Secondary | ICD-10-CM | POA: Diagnosis not present

## 2021-01-03 DIAGNOSIS — M542 Cervicalgia: Secondary | ICD-10-CM | POA: Diagnosis not present

## 2021-01-09 DIAGNOSIS — M542 Cervicalgia: Secondary | ICD-10-CM | POA: Diagnosis not present

## 2021-01-11 DIAGNOSIS — M542 Cervicalgia: Secondary | ICD-10-CM | POA: Diagnosis not present

## 2021-01-14 DIAGNOSIS — M542 Cervicalgia: Secondary | ICD-10-CM | POA: Diagnosis not present

## 2021-01-16 DIAGNOSIS — M542 Cervicalgia: Secondary | ICD-10-CM | POA: Diagnosis not present

## 2021-01-30 DIAGNOSIS — S161XXA Strain of muscle, fascia and tendon at neck level, initial encounter: Secondary | ICD-10-CM | POA: Diagnosis not present

## 2021-02-01 DIAGNOSIS — S161XXA Strain of muscle, fascia and tendon at neck level, initial encounter: Secondary | ICD-10-CM | POA: Diagnosis not present

## 2021-02-07 DIAGNOSIS — S161XXA Strain of muscle, fascia and tendon at neck level, initial encounter: Secondary | ICD-10-CM | POA: Diagnosis not present

## 2021-02-12 NOTE — Progress Notes (Deleted)
65 y.o. G1P0 Married Caucasian female here for annual exam.    PCP:     Patient's last menstrual period was 08/04/2002 (approximate).           Sexually active: {yes no:314532}  The current method of family planning is post menopausal status.    Exercising: {yes no:314532}  {types:19826} Smoker:  no  Health Maintenance: Pap:   Hx of DES exposure.  02-13-20 Neg, 02-07-19 Neg:Neg HR HPV, 01-18-18 Neg:Neg HR HPV History of abnormal Pap:  yes, 2005 hx of colpo but no treatment to cervix MMG: 03-26-20 3D/Neg/Birads1 Colonoscopy:  11/2016 normal;next 10 years BMD: 03-13-15  Result :Normal TDaP:  07/2018 Gardasil:   no HIV:01-11-16 NR Hep C: 01-11-16 Neg Screening Labs:  Hb today: ***, Urine today: ***   reports that she quit smoking about 45 years ago. She has never used smokeless tobacco. She reports current alcohol use of about 1.0 standard drink of alcohol per week. She reports that she does not use drugs.  Past Medical History:  Diagnosis Date   Elevated hemoglobin A1c 2019   5.7   Family history of DES exposure    Fibrocystic breast    GERD (gastroesophageal reflux disease)    HPV (human papilloma virus) infection    Hypercholesterolemia    OCD (obsessive compulsive disorder)    S/P excision of lipoma     Past Surgical History:  Procedure Laterality Date   ANKLE SURGERY     broken as a child   DILATION AND CURETTAGE OF UTERUS     LIPOMA EXCISION     rib cage    Current Outpatient Medications  Medication Sig Dispense Refill   CALCIUM PO Take 500 mg by mouth 2 (two) times daily.     Dexlansoprazole (DEXILANT PO) Take 60 mg by mouth. Taking Mon, Wed, Fri     escitalopram (LEXAPRO) 10 MG tablet Take 10 mg by mouth daily.     Saccharomyces boulardii (FLORASTOR PO) Take 1 capsule by mouth daily.     No current facility-administered medications for this visit.    Family History  Problem Relation Age of Onset   Breast cancer Mother    Osteoporosis Maternal Grandmother     Stroke Maternal Grandfather     Review of Systems  Exam:   LMP 08/04/2002 (Approximate)     General appearance: alert, cooperative and appears stated age Head: normocephalic, without obvious abnormality, atraumatic Neck: no adenopathy, supple, symmetrical, trachea midline and thyroid normal to inspection and palpation Lungs: clear to auscultation bilaterally Breasts: normal appearance, no masses or tenderness, No nipple retraction or dimpling, No nipple discharge or bleeding, No axillary adenopathy Heart: regular rate and rhythm Abdomen: soft, non-tender; no masses, no organomegaly Extremities: extremities normal, atraumatic, no cyanosis or edema Skin: skin color, texture, turgor normal. No rashes or lesions Lymph nodes: cervical, supraclavicular, and axillary nodes normal. Neurologic: grossly normal  Pelvic: External genitalia:  no lesions              No abnormal inguinal nodes palpated.              Urethra:  normal appearing urethra with no masses, tenderness or lesions              Bartholins and Skenes: normal                 Vagina: normal appearing vagina with normal color and discharge, no lesions  Cervix: no lesions              Pap taken: {yes no:314532} Bimanual Exam:  Uterus:  normal size, contour, position, consistency, mobility, non-tender              Adnexa: no mass, fullness, tenderness              Rectal exam: {yes no:314532}.  Confirms.              Anus:  normal sphincter tone, no lesions  Chaperone was present for exam.  Assessment:   Well woman visit with normal exam.   Plan: Mammogram screening discussed. Self breast awareness reviewed. Pap and HR HPV as above. Guidelines for Calcium, Vitamin D, regular exercise program including cardiovascular and weight bearing exercise.   Follow up annually and prn.   Additional counseling given.  {yes [id number]. _______ minutes face to face time of which over 50% was spent in counseling.     After visit summary provided.

## 2021-02-15 DIAGNOSIS — K219 Gastro-esophageal reflux disease without esophagitis: Secondary | ICD-10-CM | POA: Diagnosis not present

## 2021-02-15 DIAGNOSIS — J019 Acute sinusitis, unspecified: Secondary | ICD-10-CM | POA: Diagnosis not present

## 2021-02-18 ENCOUNTER — Ambulatory Visit: Payer: PPO | Admitting: Obstetrics and Gynecology

## 2021-02-25 DIAGNOSIS — M542 Cervicalgia: Secondary | ICD-10-CM | POA: Diagnosis not present

## 2021-02-26 DIAGNOSIS — S161XXA Strain of muscle, fascia and tendon at neck level, initial encounter: Secondary | ICD-10-CM | POA: Diagnosis not present

## 2021-02-28 DIAGNOSIS — M542 Cervicalgia: Secondary | ICD-10-CM | POA: Diagnosis not present

## 2021-03-06 DIAGNOSIS — M4722 Other spondylosis with radiculopathy, cervical region: Secondary | ICD-10-CM | POA: Diagnosis not present

## 2021-03-06 DIAGNOSIS — M542 Cervicalgia: Secondary | ICD-10-CM | POA: Diagnosis not present

## 2021-03-07 DIAGNOSIS — S161XXA Strain of muscle, fascia and tendon at neck level, initial encounter: Secondary | ICD-10-CM | POA: Diagnosis not present

## 2021-03-12 DIAGNOSIS — S161XXA Strain of muscle, fascia and tendon at neck level, initial encounter: Secondary | ICD-10-CM | POA: Diagnosis not present

## 2021-03-20 DIAGNOSIS — S161XXA Strain of muscle, fascia and tendon at neck level, initial encounter: Secondary | ICD-10-CM | POA: Diagnosis not present

## 2021-03-27 DIAGNOSIS — S161XXA Strain of muscle, fascia and tendon at neck level, initial encounter: Secondary | ICD-10-CM | POA: Diagnosis not present

## 2021-03-27 DIAGNOSIS — Z1231 Encounter for screening mammogram for malignant neoplasm of breast: Secondary | ICD-10-CM | POA: Diagnosis not present

## 2021-04-17 DIAGNOSIS — M4802 Spinal stenosis, cervical region: Secondary | ICD-10-CM | POA: Diagnosis not present

## 2021-04-17 DIAGNOSIS — M542 Cervicalgia: Secondary | ICD-10-CM | POA: Diagnosis not present

## 2021-04-17 DIAGNOSIS — G959 Disease of spinal cord, unspecified: Secondary | ICD-10-CM | POA: Diagnosis not present

## 2021-04-17 DIAGNOSIS — M4312 Spondylolisthesis, cervical region: Secondary | ICD-10-CM | POA: Diagnosis not present

## 2021-04-24 DIAGNOSIS — S161XXA Strain of muscle, fascia and tendon at neck level, initial encounter: Secondary | ICD-10-CM | POA: Diagnosis not present

## 2021-05-01 DIAGNOSIS — S161XXA Strain of muscle, fascia and tendon at neck level, initial encounter: Secondary | ICD-10-CM | POA: Diagnosis not present

## 2021-05-08 DIAGNOSIS — H5203 Hypermetropia, bilateral: Secondary | ICD-10-CM | POA: Diagnosis not present

## 2021-05-08 DIAGNOSIS — H2513 Age-related nuclear cataract, bilateral: Secondary | ICD-10-CM | POA: Diagnosis not present

## 2021-05-08 DIAGNOSIS — H43813 Vitreous degeneration, bilateral: Secondary | ICD-10-CM | POA: Diagnosis not present

## 2021-05-08 DIAGNOSIS — H52203 Unspecified astigmatism, bilateral: Secondary | ICD-10-CM | POA: Diagnosis not present

## 2021-05-15 DIAGNOSIS — R7303 Prediabetes: Secondary | ICD-10-CM | POA: Diagnosis not present

## 2021-05-15 DIAGNOSIS — F419 Anxiety disorder, unspecified: Secondary | ICD-10-CM | POA: Diagnosis not present

## 2021-05-15 DIAGNOSIS — S161XXA Strain of muscle, fascia and tendon at neck level, initial encounter: Secondary | ICD-10-CM | POA: Diagnosis not present

## 2021-05-15 DIAGNOSIS — K219 Gastro-esophageal reflux disease without esophagitis: Secondary | ICD-10-CM | POA: Diagnosis not present

## 2021-05-15 DIAGNOSIS — E7800 Pure hypercholesterolemia, unspecified: Secondary | ICD-10-CM | POA: Diagnosis not present

## 2021-05-22 DIAGNOSIS — S161XXA Strain of muscle, fascia and tendon at neck level, initial encounter: Secondary | ICD-10-CM | POA: Diagnosis not present

## 2021-05-24 DIAGNOSIS — S161XXA Strain of muscle, fascia and tendon at neck level, initial encounter: Secondary | ICD-10-CM | POA: Diagnosis not present

## 2021-05-28 DIAGNOSIS — Z0000 Encounter for general adult medical examination without abnormal findings: Secondary | ICD-10-CM | POA: Diagnosis not present

## 2021-05-28 NOTE — Progress Notes (Signed)
65 y.o. G1P0 Married Caucasian female here for Breast and pelvic exam.    No gynecologic concerns.  No bleeding, discharge, pain, bladder or bowel problems.   Saw PCP and has elevated cholesterol. She is prediabetes.  Has a healthy diet.   PCP:   Milagros Evener, MD  Patient's last menstrual period was 08/04/2002 (approximate).           Sexually active: No.  The current method of family planning is post menopausal status.    Exercising: Yes.     Walks 5 miles 4days/week, pilates, weights Smoker:  no  Health Maintenance: Pap:   Hx of DES exposure.   02-13-20 Neg, 02-07-19 Neg:Neg HR HPV, 01-18-18 Neg:Neg HR HPV History of abnormal Pap:  Yes, 2005 hx of colpo but no treatment to cervix MMG: 03-27-21 3D/Neg/Birads2 Colonoscopy: 11/2016 normal;next 10 years BMD: 03-13-15  Result :Normal --PCP scheduling    reports that she quit smoking about 45 years ago. Her smoking use included cigarettes. She has never used smokeless tobacco. She reports current alcohol use of about 1.0 standard drink per week. She reports that she does not use drugs.  Past Medical History:  Diagnosis Date   Elevated hemoglobin A1c 2019   5.7   Family history of DES exposure    Fibrocystic breast    GERD (gastroesophageal reflux disease)    HPV (human papilloma virus) infection    Hypercholesterolemia    OCD (obsessive compulsive disorder)    S/P excision of lipoma     Past Surgical History:  Procedure Laterality Date   ANKLE SURGERY     broken as a child   DILATION AND CURETTAGE OF UTERUS     LIPOMA EXCISION     rib cage    Current Outpatient Medications  Medication Sig Dispense Refill   CALCIUM PO Take 500 mg by mouth 2 (two) times daily.     DEXILANT 60 MG capsule Take 1 capsule by mouth daily.     escitalopram (LEXAPRO) 10 MG tablet Take 10 mg by mouth daily.     methocarbamol (ROBAXIN) 500 MG tablet SMARTSIG:1-2 Tablet(s) By Mouth 2-3 Times Daily PRN     naproxen (NAPROSYN) 500 MG tablet Take  500 mg by mouth 2 (two) times daily.     Probiotic Product (ALIGN) 4 MG CAPS Take 1 capsule by mouth daily.     No current facility-administered medications for this visit.    Family History  Problem Relation Age of Onset   Breast cancer Mother    Osteoporosis Maternal Grandmother    Stroke Maternal Grandfather     Review of Systems  All other systems reviewed and are negative.  Exam:   BP 138/76   Pulse 69   Ht 5' 2.5" (1.588 m)   Wt 144 lb (65.3 kg)   LMP 08/04/2002 (Approximate)   SpO2 98%   BMI 25.92 kg/m     General appearance: alert, cooperative and appears stated age Head: normocephalic, without obvious abnormality, atraumatic Neck: no adenopathy, supple, symmetrical, trachea midline and thyroid normal to inspection and palpation Lungs: clear to auscultation bilaterally Breasts: normal appearance, no masses or tenderness, No nipple retraction or dimpling, No nipple discharge or bleeding, No axillary adenopathy Heart: regular rate and rhythm Abdomen: soft, non-tender; no masses, no organomegaly Extremities: extremities normal, atraumatic, no cyanosis or edema Skin: skin color, texture, turgor normal. No rashes or lesions Lymph nodes: cervical, supraclavicular, and axillary nodes normal. Neurologic: grossly normal  Pelvic: External genitalia:  no  lesions              No abnormal inguinal nodes palpated.              Urethra:  normal appearing urethra with no masses, tenderness or lesions              Bartholins and Skenes: normal                 Vagina: normal appearing vagina with normal color and discharge, no lesions.  Atrophy noted.               Cervix: no lesions              Pap taken: yes Bimanual Exam:  Uterus:  normal size, contour, position, consistency, mobility, non-tender              Adnexa: no mass, fullness, tenderness              Rectal exam: yes.  Confirms.              Anus:  normal sphincter tone, no lesions  Chaperone was present for exam:   Estill Bamberg, CMA  Assessment:   Well woman visit with gynecologic exam. Hx DES exposure.  Vaginal atrophy.  FH of breast cancer.  Genetic testing negative in her mother.  Hx elevated HgbA1C. Elevated cholesterol.  Plan: Mammogram screening discussed. Self breast awareness reviewed. Pap and reflex HR HPV. We discussed water based lubricants, cooking oils and vit E vaginal suppositories for vaginal dryness changes. Guidelines for Calcium, Vitamin D, regular exercise program including cardiovascular and weight bearing exercise. We discussed red yeast rice and omega 3 fatty acids for treating elevated cholesterol.  She will review with her PCP these options. Follow up annually and prn.   After visit summary provided.

## 2021-05-29 DIAGNOSIS — S161XXA Strain of muscle, fascia and tendon at neck level, initial encounter: Secondary | ICD-10-CM | POA: Diagnosis not present

## 2021-05-30 ENCOUNTER — Other Ambulatory Visit: Payer: Self-pay

## 2021-05-30 ENCOUNTER — Ambulatory Visit (INDEPENDENT_AMBULATORY_CARE_PROVIDER_SITE_OTHER): Payer: PPO | Admitting: Obstetrics and Gynecology

## 2021-05-30 ENCOUNTER — Other Ambulatory Visit (HOSPITAL_COMMUNITY)
Admission: RE | Admit: 2021-05-30 | Discharge: 2021-05-30 | Disposition: A | Discharge status: Home / Self Care | Payer: PPO | Source: Ambulatory Visit | Attending: Obstetrics and Gynecology | Admitting: Obstetrics and Gynecology

## 2021-05-30 ENCOUNTER — Encounter: Payer: Self-pay | Admitting: Obstetrics and Gynecology

## 2021-05-30 VITALS — BP 138/76 | HR 69 | Ht 62.5 in | Wt 144.0 lb

## 2021-05-30 DIAGNOSIS — N952 Postmenopausal atrophic vaginitis: Secondary | ICD-10-CM | POA: Diagnosis not present

## 2021-05-30 DIAGNOSIS — Z9189 Other specified personal risk factors, not elsewhere classified: Secondary | ICD-10-CM | POA: Insufficient documentation

## 2021-05-30 DIAGNOSIS — Z01419 Encounter for gynecological examination (general) (routine) without abnormal findings: Secondary | ICD-10-CM

## 2021-05-30 NOTE — Patient Instructions (Addendum)
Fish Oil, Omega-3 Fatty Acids Capsules (OTC) What is this medication? FISH OIL, OMEGA-3 FATTY ACIDS (fish oyl, oh MEH guh three FA tee A suhds) support heart, brain, and eye health. They may also decrease inflammation. Omega-3 fatty acids are essential fats the body needs to support overall health. This supplement is not intended to diagnose, treat, cure, or prevent any disease. This medicine may be used for other purposes; ask your health care provider or pharmacist if you have questions. COMMON BRAND NAME(S): Omega-3, Omega-3 Fish Oil, OMEGA-3 IQ DHA, TherOmega, THEROMEGA SPORT What should I tell my care team before I take this medication? They need to know if you have any of these conditions Bleeding problems Lung or breathing disease, like asthma An unusual or allergic reaction to fish oil, omega-3 fatty acids, fish, other medications, foods, dyes, or preservatives Pregnant or trying to get pregnant Breast-feeding How should I use this medication? Take this medication by mouth with a glass of water. Follow the directions on the package or prescription label. Take with food. Take your medication at regular intervals. Do not take your medication more often than directed. Talk to your care team about the use of this medication in children. Special care may be needed. This medication should not be used in children without a care team's advice. Overdosage: If you think you have taken too much of this medicine contact a poison control center or emergency room at once. NOTE: This medicine is only for you. Do not share this medicine with others. What if I miss a dose? If you miss a dose, take it as soon as you can. If it is almost time for your next dose, take only that dose. Do not take double or extra doses. What may interact with this medication? Aspirin and aspirin-like medications Herbal products like danshen, dong quai, garlic pills, ginger, ginkgo biloba, horse chestnut, willow bark, and  others Medications that treat or prevent blood clots like enoxaparin, heparin, warfarin This list may not describe all possible interactions. Give your health care provider a list of all the medicines, herbs, non-prescription drugs, or dietary supplements you use. Also tell them if you smoke, drink alcohol, or use illegal drugs. Some items may interact with your medicine. What should I watch for while using this medication? Follow a good diet and exercise plan. Taking a dietary supplement does not replace a healthy lifestyle. Some foods that have omega-3 fatty acids naturally are fatty fish like albacore tuna, halibut, herring, mackerel, lake trout, salmon, and sardines. Too much of this medication can be unsafe. Talk to your care team about how much of this medication is right for you. If you are scheduled for any medical or dental procedure, tell your care team that you are taking this medication. You may need to stop taking this medication before the procedure. Herbal or dietary supplements are not regulated like medications. Rigid quality control standards are not required for dietary supplements. The purity and strength of these products can vary. The safety and effect of this dietary supplement for a certain disease or illness is not well known. This product is not intended to diagnose, treat, cure or prevent any disease. The Food and Drug Administration suggests the following to help consumers protect themselves: Always read product labels and follow directions. Natural does not mean a product is safe for humans to take. Look for products that include USP after the ingredient name. This means that the manufacturer followed the standards of the [place] Pharmacopoeia. Products  made or sold by a nationally known food or drug company are more likely to be made under tight controls. You can write to the company for more information about how the product was made. What side effects may I notice from  receiving this medication? Side effects that you should report to your care team as soon as possible: Allergic reactions-skin rash, itching, hives, swelling of the face, lips, tongue, or throat Side effects that usually do not require medical attention (report to your care team if they continue or are bothersome): Bad breath Burping Fishy aftertaste Heartburn Upset stomach This list may not describe all possible side effects. Call your doctor for medical advice about side effects. You may report side effects to FDA at 1-800-FDA-1088. Where should I keep my medication? Keep out of the reach of children. Store at room temperature or as directed on the package label. Protect from moisture. Do not freeze. Throw away any unused medication after the expiration date. NOTE: This sheet is a summary. It may not cover all possible information. If you have questions about this medicine, talk to your doctor, pharmacist, or health care provider.  2022 Elsevier/Gold Standard (2020-10-14 17:48:53) Red Yeast Rice capsules What is this medication? RED YEAST RICE (red yeest rahys) is intended to be used by healthy adults to help lower blood cholesterol in conjunction with a healthy diet and a regular exercise program. The FDA has not approved this supplement for any medical use. If medical treatment is needed for cholesterol control or any other disease, you should contact your doctor or health care professional regarding the use of this product. This supplement may be used for other purposes; ask your health care provider or pharmacist if you have questions. This medicine may be used for other purposes; ask your health care provider or pharmacist if you have questions. COMMON BRAND NAME(S): Red Yeast Rice What should I tell my care team before I take this medication? They need to know if you have any of these conditions: frequently drink alcoholic beverages kidney disease liver disease muscle aches or  weakness other medical condition an unusual or allergic reaction to red yeast rice, went yeast, lovastatin, other 'statin' medications, other medicines, foods, dyes, or preservatives pregnant or trying to get pregnant breast-feeding How should I use this medication? Take this supplement by mouth with a glass of water. Follow the directions on the package labeling, or take as directed by your health care professional. Do not take this supplement more often than directed. Contact your pediatrician or health care professional regarding the use of this supplement in children. Special care may be needed. This supplement is not recommended for use in children. Overdosage: If you think you have taken too much of this medicine contact a poison control center or emergency room at once. NOTE: This medicine is only for you. Do not share this medicine with others. What if I miss a dose? If you miss a dose, take it as soon as you can. If it is almost time for your next dose, take only that dose. Do not take double or extra doses. What may interact with this medication? Do not take this medicine with any of the following medications: clarithromycin delavirdine erythromycin grapefruit juice protease inhibitors used to treat HIV infection medicines for fungal infections like itraconazole, ketoconazole, posaconazole, and voriconazole mibefradil nefazodone other medicines for high cholesterol telithromycin troleandomycin This medicine may also interact with the following medications: alcohol amiodarone colchicine cyclosporine danazol diltiazem fenofibrate fluconazole gemfibrozil mifepristone,  RU-486 niacin St. John's wort verapamil voriconazole warfarin This list may not describe all possible interactions. Give your health care provider a list of all the medicines, herbs, non-prescription drugs, or dietary supplements you use. Also tell them if you smoke, drink alcohol, or use illegal drugs.  Some items may interact with your medicine. What should I watch for while using this medication? Visit your doctor or health care professional for regular check-ups. You may need regular tests to make sure your liver is working properly. Tell you doctor or health care professional right away if you get any unexplained muscle pain, tenderness, or weakness, especially if you also have a fever and tiredness. Some drugs may increase the risk of side effects from this supplement. If you are given certain antibiotics or antifungals, you should stop taking this supplement during those treatments. Check with your doctor or pharmacist for advice. If you are scheduled for any medical or dental procedure, tell your healthcare provider that you are taking this supplement. You may need to stop taking this supplement before the procedure. Do not use this drug if you are pregnant or breast-feeding. Serious side effects to an unborn child or to an infant are possible. Talk to your doctor or pharmacist for more information. Herbal or dietary supplements are not regulated like medicines. Rigid quality control standards are not required for dietary supplements. The purity and strength of these products can vary. The safety and effect of this dietary supplement for a certain disease or illness is not well known. This product is not intended to diagnose, treat, cure or prevent any disease. The Food and Drug Administration suggests the following to help consumers protect themselves: Always read product labels and follow directions. Natural does not mean a product is safe for humans to take. Look for products that include USP after the ingredient name. This means that the manufacturer followed the standards of the [place] Pharmacopoeia. Supplements made or sold by a nationally known food or drug company are more likely to be made under tight controls. You can write to the company for more information about how the product was  made. What side effects may I notice from receiving this medication? Side effects that you should report to your doctor or health care professional as soon as possible: allergic reactions like skin rash, itching or hives, swelling of the face, lips, or tongue dark urine fever joint pain muscle cramps, pain redness, blistering, peeling or loosening of the skin, including inside the mouth trouble passing urine or change in the amount of urine unusually weak or tired yellowing of the eyes or skin Side effects that usually do not require medical attention (report to your doctor or health care professional if they continue or are bothersome): constipation headache stomach gas, pain, upset nausea trouble sleeping This list may not describe all possible side effects. Call your doctor for medical advice about side effects. You may report side effects to FDA at 1-800-FDA-1088. Where should I keep my medication? Keep out of the reach of children. Store at room temperature between 15 and 30 degrees C (59 and 86 degrees F). Throw away any unused medicine after the expiration date. NOTE: This sheet is a summary. It may not cover all possible information. If you have questions about this medicine, talk to your doctor, pharmacist, or health care provider.  2022 Elsevier/Gold Standard (2014-03-28 10:26:14) Dyslipidemia Dyslipidemia is an imbalance of waxy, fat-like substances (lipids) in the blood. The body needs lipids in small amounts. Dyslipidemia  often involves a high level of cholesterol or triglycerides, which are types of lipids. Common forms of dyslipidemia include: High levels of LDL cholesterol. LDL is the type of cholesterol that causes fatty deposits (plaques) to build up in the blood vessels that carry blood away from the heart (arteries). Low levels of HDL cholesterol. HDL cholesterol is the type of cholesterol that protects against heart disease. High levels of HDL remove the LDL buildup  from arteries. High levels of triglycerides. Triglycerides are a fatty substance in the blood that is linked to a buildup of plaques in the arteries. What are the causes? There are two main types of dyslipidemia: primary and secondary. Primary dyslipidemia is caused by changes (mutations) in genes that are passed down through families (inherited). These mutations cause several types of dyslipidemia. Secondary dyslipidemia may be caused by various risk factors that can lead to the disease, such as lifestyle choices and certain medical conditions. What increases the risk? You are more likely to develop this condition if you are an older man or if you are a woman who has gone through menopause. Other risk factors include: Having a family history of dyslipidemia. Taking certain medicines, including birth control pills, steroids, some diuretics, and beta-blockers. Eating a diet high in saturated fat. Smoking cigarettes or excessive alcohol intake. Having certain medical conditions such as diabetes, polycystic ovary syndrome (PCOS), kidney disease, liver disease, or hypothyroidism. Not exercising regularly. Being overweight or obese with too much belly fat. What are the signs or symptoms? In most cases, dyslipidemia does not usually cause any symptoms. In severe cases, very high lipid levels can cause: Fatty bumps under the skin (xanthomas). A white or gray ring around the black center (pupil) of the eye. Very high triglyceride levels can cause inflammation of the pancreas (pancreatitis). How is this diagnosed? Your health care provider may diagnose dyslipidemia based on a routine blood test (fasting blood test). Because most people do not have symptoms of the condition, this blood testing (lipid profile) is done on adults age 68 and older and is repeated every 4-6 years. This test checks: Total cholesterol. This measures the total amount of cholesterol in your blood, including LDL cholesterol, HDL  cholesterol, and triglycerides. A healthy number is below 200 mg/dL (5.17 mmol/L). LDL cholesterol. The target number for LDL cholesterol is different for each person, depending on individual risk factors. A healthy number is usually below 100 mg/dL (2.59 mmol/L). Ask your health care provider what your LDL cholesterol should be. HDL cholesterol. An HDL level of 60 mg/dL (1.55 mmol/L) or higher is best because it helps to protect against heart disease. A number below 40 mg/dL (1.03 mmol/L) for men or below 50 mg/dL (1.29 mmol/L) for women increases the risk for heart disease. Triglycerides. A healthy triglyceride number is below 150 mg/dL (1.69 mmol/L). If your lipid profile is abnormal, your health care provider may do other blood tests. How is this treated? Treatment depends on the type of dyslipidemia that you have and your other risk factors for heart disease and stroke. Your health care provider will have a target range for your lipid levels based on this information. Treatment for dyslipidemia starts with lifestyle changes, such as diet and exercise. Your health care provider may recommend that you: Get regular exercise. Make changes to your diet. Quit smoking if you smoke. Limit your alcohol intake. If diet changes and exercise do not help you reach your goals, your health care provider may also prescribe medicine to lower  lipids. The most commonly prescribed type of medicine lowers your LDL cholesterol (statin drug). If you have a high triglyceride level, your provider may prescribe another type of drug (fibrate) or an omega-3 fish oil supplement, or both. Follow these instructions at home: Eating and drinking  Follow instructions from your health care provider or dietitian about eating or drinking restrictions. Eat a healthy diet as told by your health care provider. This can help you reach and maintain a healthy weight, lower your LDL cholesterol, and raise your HDL cholesterol. This may  include: Limiting your calories, if you are overweight. Eating more fruits, vegetables, whole grains, fish, and lean meats. Limiting saturated fat, trans fat, and cholesterol. Do not drink alcohol if: Your health care provider tells you not to drink. You are pregnant, may be pregnant, or are planning to become pregnant. If you drink alcohol: Limit how much you have to: 0-1 drink a day for women. 0-2 drinks a day for men. Know how much alcohol is in your drink. In the [place] one drink equals one 12 oz bottle of beer (355 mL), one 5 oz glass of wine (148 mL), or one 1 oz glass of hard liquor (44 mL). Activity Get regular exercise. Start an exercise and strength training program as told by your health care provider. Ask your health care provider what activities are safe for you. Your health care provider may recommend: 30 minutes of aerobic activity 4-6 days a week. Brisk walking is an example of aerobic activity. Strength training 2 days a week. General instructions Do not use any products that contain nicotine or tobacco. These products include cigarettes, chewing tobacco, and vaping devices, such as e-cigarettes. If you need help quitting, ask your health care provider. Take over-the-counter and prescription medicines only as told by your health care provider. This includes supplements. Keep all follow-up visits. This is important. Contact a health care provider if: You are having trouble sticking to your exercise or diet plan. You are struggling to quit smoking or to control your use of alcohol. Summary Dyslipidemia often involves a high level of cholesterol or triglycerides, which are types of lipids. Treatment depends on the type of dyslipidemia that you have and your other risk factors for heart disease and stroke. Treatment for dyslipidemia starts with lifestyle changes, such as diet and exercise. Your health care provider may prescribe medicine to lower lipids. This information is  not intended to replace advice given to you by your health care provider. Make sure you discuss any questions you have with your health care provider. Document Revised: 09/24/2020 Document Reviewed: 09/24/2020 Elsevier Patient Education  2022 [employer].

## 2021-05-31 DIAGNOSIS — Z780 Asymptomatic menopausal state: Secondary | ICD-10-CM | POA: Diagnosis not present

## 2021-05-31 DIAGNOSIS — S161XXA Strain of muscle, fascia and tendon at neck level, initial encounter: Secondary | ICD-10-CM | POA: Diagnosis not present

## 2021-05-31 LAB — CYTOLOGY - PAP: Diagnosis: NEGATIVE

## 2021-06-07 DIAGNOSIS — S161XXA Strain of muscle, fascia and tendon at neck level, initial encounter: Secondary | ICD-10-CM | POA: Diagnosis not present

## 2021-06-14 DIAGNOSIS — S161XXA Strain of muscle, fascia and tendon at neck level, initial encounter: Secondary | ICD-10-CM | POA: Diagnosis not present

## 2021-06-19 DIAGNOSIS — S161XXA Strain of muscle, fascia and tendon at neck level, initial encounter: Secondary | ICD-10-CM | POA: Diagnosis not present

## 2021-06-20 DIAGNOSIS — R197 Diarrhea, unspecified: Secondary | ICD-10-CM | POA: Diagnosis not present

## 2021-06-22 DIAGNOSIS — A059 Bacterial foodborne intoxication, unspecified: Secondary | ICD-10-CM | POA: Diagnosis not present

## 2021-06-22 DIAGNOSIS — R197 Diarrhea, unspecified: Secondary | ICD-10-CM | POA: Diagnosis not present

## 2021-06-26 DIAGNOSIS — S161XXA Strain of muscle, fascia and tendon at neck level, initial encounter: Secondary | ICD-10-CM | POA: Diagnosis not present

## 2021-07-03 DIAGNOSIS — S161XXA Strain of muscle, fascia and tendon at neck level, initial encounter: Secondary | ICD-10-CM | POA: Diagnosis not present

## 2021-07-09 ENCOUNTER — Encounter: Payer: Self-pay | Admitting: Obstetrics and Gynecology

## 2021-07-09 DIAGNOSIS — S161XXA Strain of muscle, fascia and tendon at neck level, initial encounter: Secondary | ICD-10-CM | POA: Diagnosis not present

## 2021-07-24 DIAGNOSIS — S161XXA Strain of muscle, fascia and tendon at neck level, initial encounter: Secondary | ICD-10-CM | POA: Diagnosis not present

## 2021-07-31 DIAGNOSIS — S161XXA Strain of muscle, fascia and tendon at neck level, initial encounter: Secondary | ICD-10-CM | POA: Diagnosis not present

## 2021-08-07 DIAGNOSIS — M542 Cervicalgia: Secondary | ICD-10-CM | POA: Diagnosis not present

## 2021-08-07 DIAGNOSIS — M5382 Other specified dorsopathies, cervical region: Secondary | ICD-10-CM | POA: Diagnosis not present

## 2021-08-07 DIAGNOSIS — M5412 Radiculopathy, cervical region: Secondary | ICD-10-CM | POA: Diagnosis not present

## 2021-08-07 DIAGNOSIS — M7912 Myalgia of auxiliary muscles, head and neck: Secondary | ICD-10-CM | POA: Diagnosis not present

## 2021-08-21 DIAGNOSIS — M542 Cervicalgia: Secondary | ICD-10-CM | POA: Diagnosis not present

## 2021-08-21 DIAGNOSIS — M7912 Myalgia of auxiliary muscles, head and neck: Secondary | ICD-10-CM | POA: Diagnosis not present

## 2021-08-21 DIAGNOSIS — M5412 Radiculopathy, cervical region: Secondary | ICD-10-CM | POA: Diagnosis not present

## 2021-08-21 DIAGNOSIS — M5382 Other specified dorsopathies, cervical region: Secondary | ICD-10-CM | POA: Diagnosis not present

## 2021-08-28 DIAGNOSIS — G959 Disease of spinal cord, unspecified: Secondary | ICD-10-CM | POA: Diagnosis not present

## 2021-08-28 DIAGNOSIS — M4802 Spinal stenosis, cervical region: Secondary | ICD-10-CM | POA: Diagnosis not present

## 2021-08-28 DIAGNOSIS — R030 Elevated blood-pressure reading, without diagnosis of hypertension: Secondary | ICD-10-CM | POA: Diagnosis not present

## 2021-08-30 DIAGNOSIS — M5412 Radiculopathy, cervical region: Secondary | ICD-10-CM | POA: Diagnosis not present

## 2021-08-30 DIAGNOSIS — M7912 Myalgia of auxiliary muscles, head and neck: Secondary | ICD-10-CM | POA: Diagnosis not present

## 2021-08-30 DIAGNOSIS — M5382 Other specified dorsopathies, cervical region: Secondary | ICD-10-CM | POA: Diagnosis not present

## 2021-08-30 DIAGNOSIS — M542 Cervicalgia: Secondary | ICD-10-CM | POA: Diagnosis not present

## 2021-09-05 ENCOUNTER — Other Ambulatory Visit: Payer: Self-pay | Admitting: Internal Medicine

## 2021-09-05 DIAGNOSIS — M542 Cervicalgia: Secondary | ICD-10-CM | POA: Diagnosis not present

## 2021-09-05 DIAGNOSIS — F419 Anxiety disorder, unspecified: Secondary | ICD-10-CM | POA: Diagnosis not present

## 2021-09-05 DIAGNOSIS — M5382 Other specified dorsopathies, cervical region: Secondary | ICD-10-CM | POA: Diagnosis not present

## 2021-09-05 DIAGNOSIS — K219 Gastro-esophageal reflux disease without esophagitis: Secondary | ICD-10-CM | POA: Diagnosis not present

## 2021-09-05 DIAGNOSIS — E7800 Pure hypercholesterolemia, unspecified: Secondary | ICD-10-CM

## 2021-09-05 DIAGNOSIS — M5412 Radiculopathy, cervical region: Secondary | ICD-10-CM | POA: Diagnosis not present

## 2021-09-05 DIAGNOSIS — M7912 Myalgia of auxiliary muscles, head and neck: Secondary | ICD-10-CM | POA: Diagnosis not present

## 2021-09-05 DIAGNOSIS — R7303 Prediabetes: Secondary | ICD-10-CM | POA: Diagnosis not present

## 2021-09-12 DIAGNOSIS — M5412 Radiculopathy, cervical region: Secondary | ICD-10-CM | POA: Diagnosis not present

## 2021-09-12 DIAGNOSIS — M7912 Myalgia of auxiliary muscles, head and neck: Secondary | ICD-10-CM | POA: Diagnosis not present

## 2021-09-12 DIAGNOSIS — M5382 Other specified dorsopathies, cervical region: Secondary | ICD-10-CM | POA: Diagnosis not present

## 2021-09-12 DIAGNOSIS — M542 Cervicalgia: Secondary | ICD-10-CM | POA: Diagnosis not present

## 2021-09-19 DIAGNOSIS — M542 Cervicalgia: Secondary | ICD-10-CM | POA: Diagnosis not present

## 2021-09-19 DIAGNOSIS — M5412 Radiculopathy, cervical region: Secondary | ICD-10-CM | POA: Diagnosis not present

## 2021-09-19 DIAGNOSIS — M7912 Myalgia of auxiliary muscles, head and neck: Secondary | ICD-10-CM | POA: Diagnosis not present

## 2021-09-19 DIAGNOSIS — M5382 Other specified dorsopathies, cervical region: Secondary | ICD-10-CM | POA: Diagnosis not present

## 2021-09-25 DIAGNOSIS — M5382 Other specified dorsopathies, cervical region: Secondary | ICD-10-CM | POA: Diagnosis not present

## 2021-09-25 DIAGNOSIS — M542 Cervicalgia: Secondary | ICD-10-CM | POA: Diagnosis not present

## 2021-09-25 DIAGNOSIS — M7912 Myalgia of auxiliary muscles, head and neck: Secondary | ICD-10-CM | POA: Diagnosis not present

## 2021-09-25 DIAGNOSIS — M5412 Radiculopathy, cervical region: Secondary | ICD-10-CM | POA: Diagnosis not present

## 2021-10-02 ENCOUNTER — Ambulatory Visit
Admission: RE | Admit: 2021-10-02 | Discharge: 2021-10-02 | Disposition: A | Discharge status: Home / Self Care | Payer: No Typology Code available for payment source | Source: Ambulatory Visit | Attending: Internal Medicine | Admitting: Internal Medicine

## 2021-10-02 DIAGNOSIS — M542 Cervicalgia: Secondary | ICD-10-CM | POA: Diagnosis not present

## 2021-10-02 DIAGNOSIS — E785 Hyperlipidemia, unspecified: Secondary | ICD-10-CM | POA: Diagnosis not present

## 2021-10-02 DIAGNOSIS — E7800 Pure hypercholesterolemia, unspecified: Secondary | ICD-10-CM

## 2021-10-02 DIAGNOSIS — M7912 Myalgia of auxiliary muscles, head and neck: Secondary | ICD-10-CM | POA: Diagnosis not present

## 2021-10-02 DIAGNOSIS — M5412 Radiculopathy, cervical region: Secondary | ICD-10-CM | POA: Diagnosis not present

## 2021-10-02 DIAGNOSIS — M5382 Other specified dorsopathies, cervical region: Secondary | ICD-10-CM | POA: Diagnosis not present

## 2021-10-07 DIAGNOSIS — L578 Other skin changes due to chronic exposure to nonionizing radiation: Secondary | ICD-10-CM | POA: Diagnosis not present

## 2021-10-07 DIAGNOSIS — L309 Dermatitis, unspecified: Secondary | ICD-10-CM | POA: Diagnosis not present

## 2021-10-07 DIAGNOSIS — L2084 Intrinsic (allergic) eczema: Secondary | ICD-10-CM | POA: Diagnosis not present

## 2021-10-07 DIAGNOSIS — D2271 Melanocytic nevi of right lower limb, including hip: Secondary | ICD-10-CM | POA: Diagnosis not present

## 2021-10-07 DIAGNOSIS — D2272 Melanocytic nevi of left lower limb, including hip: Secondary | ICD-10-CM | POA: Diagnosis not present

## 2021-10-07 DIAGNOSIS — L821 Other seborrheic keratosis: Secondary | ICD-10-CM | POA: Diagnosis not present

## 2021-10-07 DIAGNOSIS — D225 Melanocytic nevi of trunk: Secondary | ICD-10-CM | POA: Diagnosis not present

## 2021-10-07 DIAGNOSIS — D2262 Melanocytic nevi of left upper limb, including shoulder: Secondary | ICD-10-CM | POA: Diagnosis not present

## 2021-10-07 DIAGNOSIS — L814 Other melanin hyperpigmentation: Secondary | ICD-10-CM | POA: Diagnosis not present

## 2021-10-10 DIAGNOSIS — M7912 Myalgia of auxiliary muscles, head and neck: Secondary | ICD-10-CM | POA: Diagnosis not present

## 2021-10-10 DIAGNOSIS — M5382 Other specified dorsopathies, cervical region: Secondary | ICD-10-CM | POA: Diagnosis not present

## 2021-10-10 DIAGNOSIS — M5412 Radiculopathy, cervical region: Secondary | ICD-10-CM | POA: Diagnosis not present

## 2021-10-10 DIAGNOSIS — M542 Cervicalgia: Secondary | ICD-10-CM | POA: Diagnosis not present

## 2021-10-23 DIAGNOSIS — M7912 Myalgia of auxiliary muscles, head and neck: Secondary | ICD-10-CM | POA: Diagnosis not present

## 2021-10-23 DIAGNOSIS — M5412 Radiculopathy, cervical region: Secondary | ICD-10-CM | POA: Diagnosis not present

## 2021-10-23 DIAGNOSIS — M542 Cervicalgia: Secondary | ICD-10-CM | POA: Diagnosis not present

## 2021-10-23 DIAGNOSIS — M5382 Other specified dorsopathies, cervical region: Secondary | ICD-10-CM | POA: Diagnosis not present

## 2021-11-06 DIAGNOSIS — M5412 Radiculopathy, cervical region: Secondary | ICD-10-CM | POA: Diagnosis not present

## 2021-11-06 DIAGNOSIS — M7912 Myalgia of auxiliary muscles, head and neck: Secondary | ICD-10-CM | POA: Diagnosis not present

## 2021-11-06 DIAGNOSIS — M542 Cervicalgia: Secondary | ICD-10-CM | POA: Diagnosis not present

## 2021-11-06 DIAGNOSIS — M5382 Other specified dorsopathies, cervical region: Secondary | ICD-10-CM | POA: Diagnosis not present

## 2021-11-20 DIAGNOSIS — M542 Cervicalgia: Secondary | ICD-10-CM | POA: Diagnosis not present

## 2021-11-20 DIAGNOSIS — M5382 Other specified dorsopathies, cervical region: Secondary | ICD-10-CM | POA: Diagnosis not present

## 2021-11-20 DIAGNOSIS — M5412 Radiculopathy, cervical region: Secondary | ICD-10-CM | POA: Diagnosis not present

## 2021-11-20 DIAGNOSIS — M7912 Myalgia of auxiliary muscles, head and neck: Secondary | ICD-10-CM | POA: Diagnosis not present

## 2021-11-21 DIAGNOSIS — R059 Cough, unspecified: Secondary | ICD-10-CM | POA: Diagnosis not present

## 2021-11-21 DIAGNOSIS — J069 Acute upper respiratory infection, unspecified: Secondary | ICD-10-CM | POA: Diagnosis not present

## 2021-11-21 DIAGNOSIS — K219 Gastro-esophageal reflux disease without esophagitis: Secondary | ICD-10-CM | POA: Diagnosis not present

## 2021-11-29 DIAGNOSIS — M4802 Spinal stenosis, cervical region: Secondary | ICD-10-CM | POA: Diagnosis not present

## 2021-11-29 DIAGNOSIS — G959 Disease of spinal cord, unspecified: Secondary | ICD-10-CM | POA: Diagnosis not present

## 2021-12-04 DIAGNOSIS — M7912 Myalgia of auxiliary muscles, head and neck: Secondary | ICD-10-CM | POA: Diagnosis not present

## 2021-12-04 DIAGNOSIS — M542 Cervicalgia: Secondary | ICD-10-CM | POA: Diagnosis not present

## 2021-12-04 DIAGNOSIS — M5412 Radiculopathy, cervical region: Secondary | ICD-10-CM | POA: Diagnosis not present

## 2021-12-04 DIAGNOSIS — M5382 Other specified dorsopathies, cervical region: Secondary | ICD-10-CM | POA: Diagnosis not present

## 2021-12-05 ENCOUNTER — Ambulatory Visit: Payer: PPO | Attending: Internal Medicine

## 2021-12-05 ENCOUNTER — Other Ambulatory Visit (HOSPITAL_BASED_OUTPATIENT_CLINIC_OR_DEPARTMENT_OTHER): Payer: Self-pay

## 2021-12-05 DIAGNOSIS — Z23 Encounter for immunization: Secondary | ICD-10-CM

## 2021-12-05 MED ORDER — PFIZER COVID-19 VAC BIVALENT 30 MCG/0.3ML IM SUSP
INTRAMUSCULAR | 0 refills | Status: DC
  Filled 2021-12-05: qty 0.3, 1d supply, fill #0

## 2021-12-25 DIAGNOSIS — M7912 Myalgia of auxiliary muscles, head and neck: Secondary | ICD-10-CM | POA: Diagnosis not present

## 2021-12-25 DIAGNOSIS — M542 Cervicalgia: Secondary | ICD-10-CM | POA: Diagnosis not present

## 2021-12-25 DIAGNOSIS — M5382 Other specified dorsopathies, cervical region: Secondary | ICD-10-CM | POA: Diagnosis not present

## 2021-12-25 DIAGNOSIS — M5412 Radiculopathy, cervical region: Secondary | ICD-10-CM | POA: Diagnosis not present

## 2022-01-08 DIAGNOSIS — M5412 Radiculopathy, cervical region: Secondary | ICD-10-CM | POA: Diagnosis not present

## 2022-01-08 DIAGNOSIS — M5382 Other specified dorsopathies, cervical region: Secondary | ICD-10-CM | POA: Diagnosis not present

## 2022-01-08 DIAGNOSIS — M542 Cervicalgia: Secondary | ICD-10-CM | POA: Diagnosis not present

## 2022-01-08 DIAGNOSIS — M7912 Myalgia of auxiliary muscles, head and neck: Secondary | ICD-10-CM | POA: Diagnosis not present

## 2022-01-17 DIAGNOSIS — M7912 Myalgia of auxiliary muscles, head and neck: Secondary | ICD-10-CM | POA: Diagnosis not present

## 2022-01-17 DIAGNOSIS — M5382 Other specified dorsopathies, cervical region: Secondary | ICD-10-CM | POA: Diagnosis not present

## 2022-01-17 DIAGNOSIS — M542 Cervicalgia: Secondary | ICD-10-CM | POA: Diagnosis not present

## 2022-01-17 DIAGNOSIS — M5412 Radiculopathy, cervical region: Secondary | ICD-10-CM | POA: Diagnosis not present

## 2022-01-29 DIAGNOSIS — M5382 Other specified dorsopathies, cervical region: Secondary | ICD-10-CM | POA: Diagnosis not present

## 2022-01-29 DIAGNOSIS — M5412 Radiculopathy, cervical region: Secondary | ICD-10-CM | POA: Diagnosis not present

## 2022-01-29 DIAGNOSIS — M7912 Myalgia of auxiliary muscles, head and neck: Secondary | ICD-10-CM | POA: Diagnosis not present

## 2022-01-29 DIAGNOSIS — M542 Cervicalgia: Secondary | ICD-10-CM | POA: Diagnosis not present

## 2022-02-10 DIAGNOSIS — M4802 Spinal stenosis, cervical region: Secondary | ICD-10-CM | POA: Diagnosis not present

## 2022-03-04 ENCOUNTER — Ambulatory Visit (HOSPITAL_BASED_OUTPATIENT_CLINIC_OR_DEPARTMENT_OTHER): Payer: PPO | Attending: Family Medicine | Admitting: Physical Therapy

## 2022-03-04 ENCOUNTER — Encounter (HOSPITAL_BASED_OUTPATIENT_CLINIC_OR_DEPARTMENT_OTHER): Payer: Self-pay | Admitting: Physical Therapy

## 2022-03-04 ENCOUNTER — Other Ambulatory Visit: Payer: Self-pay

## 2022-03-04 DIAGNOSIS — M542 Cervicalgia: Secondary | ICD-10-CM | POA: Insufficient documentation

## 2022-03-04 NOTE — Therapy (Signed)
OUTPATIENT PHYSICAL THERAPY CERVICAL EVALUATION   Patient Name: Ashley Blair MRN: [id number] DOB:1955-10-26, 66 y.o., female Today's Date: 03/04/2022   PT End of Session - 03/04/22 1244     Visit Number 1    Number of Visits 13    Date for PT Re-Evaluation 06/02/22    Authorization Type HTA    PT Start Time 1150    PT Stop Time 1240    PT Time Calculation (min) 50 min    Activity Tolerance Patient tolerated treatment well;Patient limited by pain    Behavior During Therapy [facility] for tasks assessed/performed             Past Medical History:  Diagnosis Date   Elevated hemoglobin A1c 2019   5.7   Family history of DES exposure    Fibrocystic breast    GERD (gastroesophageal reflux disease)    HPV (human papilloma virus) infection    Hypercholesterolemia    OCD (obsessive compulsive disorder)    S/P excision of lipoma    Past Surgical History:  Procedure Laterality Date   ANKLE SURGERY     broken as a child   DILATION AND CURETTAGE OF UTERUS     LIPOMA EXCISION     rib cage   Patient Active Problem List   Diagnosis Date Noted   Family history of breast cancer in mother 12/12/2013   H/O diethylstilbestrol (DES) exposure in utero 12/12/2013    PCP:  Rankins, Bill Salinas, MD     REFERRING PROVIDER: Simona Huh, NP  REFERRING DIAG: M54.2 (ICD-10-CM) - Cervicalgia  THERAPY DIAG:  Cervicalgia - Plan: PT plan of care cert/re-cert  Rationale for Evaluation and Treatment Rehabilitation  ONSET DATE: 01/2022  SUBJECTIVE:                                                                                                                                                                                                         SUBJECTIVE STATEMENT: Pt reports chronic stiffness and tightness on the L side. She states that during [place] it started getting worse. She had PT for the last year or so which helped to loosen up the entire R side. She reports lots of manual  therapy being performed. I Pt denies neurologic symptoms or NT or radiating pain down the arm. Pt states there is significant crepitus with turning her neck and movement. Pt states around June/July, she states it started getting more and more sore. She is here per MD recc for TPDN. She denies cancer red flags or neurological deficits. She presents  with  MRI results that show degenerative changes with in her neck. Pt was previous fitness instructor. She works out but does not stretch her neck currently due to the increased pain.  Pt denies cancer red flags or systemic symptoms.   PERTINENT HISTORY:  N/A  PAIN:  Are you having pain? Yes: NPRS scale: 3/10 Pain location: L lateral side of C2-3 region Pain description: aching, cramping, dull, sharp, spasm Aggravating factors: movement Relieving factors: resting  PRECAUTIONS: None  WEIGHT BEARING RESTRICTIONS No  FALLS:  Has patient fallen in last 6 months? 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 Mild spinal canal stenosis at C3-4, C4-5, and C6-7. Multilevel neural foraminal narrowing, as described above.    PATIENT SURVEYS:  FOTO 63 66 @ DC  COGNITION: Overall cognitive status: Within functional limits for tasks assessed   SENSATION: WFL  POSTURE:  mild forward shoulders, L sided head tilt in seated position  PALPATION: TTP and hypertonicity of bilat C/S paraspinals, L UT and levator  Joint stiffness with extension and rotation with CPA and UPA glides from C2-6   CERVICAL ROM:   Active ROM A/PROM  eval  Flexion 90%  Extension 50%  Right lateral flexion 20%  Left  lateral flexion 60%  Right rotation 45%  Left rotation 30%   (Blank rows = not tested)  UPPER EXTREMITY ROM: UE ROM WNL  UPPER EXTREMITY MMT:  MMT Right eval Left eval  Shoulder flexion 4+/5 4+/5  Shoulder abduction 4+/5 4+/5  Shoulder internal rotation 4+/5 4+/5  Grip strength 50 lbs, 52, 50 55lbs, 50, 45   (Blank rows = not tested)  CERVICAL SPECIAL TESTS:  Cranial cervical flexion test: Positive, Spurling's test: Negative, and Distraction test: Negative  C6 reflexes 2+ bilat C7 reflexes 2+ bilat  TODAY'S TREATMENT:   Manual: LAD with manual traction, CPA and UPA from C3-6 grade IV   Exercises - Seated Cervical Retraction  - 5-6 x daily - 7 x weekly - 3 sets - 10 reps - Seated Assisted Cervical Rotation with Towel  - 2 x daily - 7 x weekly - 1 sets - 10 reps - 3 hold - Gentle Levator Scapulae Stretch  - 2 x daily - 7 x weekly - 1 sets - 3 reps - 30 hold   PATIENT EDUCATION:  Education details: MOI, diagnosis, prognosis, anatomy, exercise progression, DOMS expectations, muscle firing,  envelope of function, HEP, POC   Person educated: Patient Education method: Explanation, Demonstration, Tactile cues, Verbal cues, and Handouts Education comprehension: verbalized understanding, returned demonstration, verbal cues required, tactile cues required, and needs further education     HOME EXERCISE PROGRAM:  Access Code: [email or web address] URL: [email or web address] Date: 03/04/2022 Prepared by: Daleen Bo   ASSESSMENT:   CLINICAL IMPRESSION: Patient is a 66 y.o. female who was seen today for physical therapy evaluation and treatment for c/c of neck pain. Pt's s/s appear consistent with mechanical due to signs of C/S OA and muscle hypertonicity. Pt imaging does report stenosis and imaging does show disc bulging but pt does not present with any neurological symptoms and does not report radicular issues. Pt with highly irritable pain during session. Pt's limitation appear  largely to be to joint stiffness related, causing localized muscle spasm. Pt would benefit from continued skilled therapy in order to reach goals and maximize functional C/S strength and ROM for full return to PLOF.      OBJECTIVE IMPAIRMENTS decreased ROM, decreased strength, hypomobility, increased fascial restrictions, increased muscle spasms, impaired flexibility, improper body mechanics, postural dysfunction, and pain.    ACTIVITY LIMITATIONS carrying, lifting, sitting   PARTICIPATION LIMITATIONS: driving, shopping, community activity, occupation, and yard work   PERSONAL FACTORS Age, Time since onset of injury/illness/exacerbation, and 1 comorbidity:    are also affecting patient's functional outcome.    REHAB POTENTIAL: Good   CLINICAL DECISION MAKING: Stable/uncomplicated   EVALUATION COMPLEXITY: Low     GOALS:   SHORT TERM GOALS: Target date: 04/15/2022    Pt will become independent with HEP in order to demonstrate synthesis of PT education.  Goal status: INITIAL   2.  Pt will report at least 2 pt reduction on NPRS scale for pain in order to demonstrate functional improvement with household activity, self care, and ADL.    Goal status: INITIAL   3.  Pt will be able to report abolishment of HA in order to demonstrate functional improvement in C/S function for self-care and house hold duties.    Goal status: INITIAL       LONG TERM GOALS: Target date: 06/02/2022    Pt  will become independent with final HEP in order to demonstrate synthesis of PT education.    Goal status: INITIAL   2.  Pt will be able to demonstrate full C/S ROM without pain in order to demonstrate functional improvement in C/s function for return to PLOF.   Goal status: INITIAL   3.  Pt will be able to demonstrate/report ability to sit/stand/sleep for extended periods of time without pain in order to demonstrate functional improvement and tolerance to static positioning.  Goal status:  INITIAL  4.  Pt will score >/= 66 on FOTO to demonstrate improvement in perceived C/S function.  Baseline:  Goal status: INITIAL  PLAN: PT FREQUENCY: 1-2x/week   PT DURATION: 12 weeks (likely every other week, likely D/C in 8 wks)   PLANNED INTERVENTIONS: Therapeutic exercises, Therapeutic activity, Neuromuscular re-education, Balance training, Gait training, Patient/Family education, Self Care, Joint mobilization, Joint manipulation, Orthotic/Fit training, Aquatic Therapy, Dry Needling, Electrical stimulation, Spinal manipulation, Spinal mobilization, Cryotherapy, Moist heat, Compression bandaging, scar mobilization, Splintting, Taping, Vasopneumatic device, Traction, Ultrasound, Ionotophoresis '4mg'$ /ml Dexamethasone, Manual therapy, and Re-evaluation   PLAN FOR NEXT SESSION: joint mobs, TPDN, C/S stretching, LAD  Daleen Bo PT, DPT 03/04/22 1:01 PM

## 2022-03-06 DIAGNOSIS — M542 Cervicalgia: Secondary | ICD-10-CM | POA: Diagnosis not present

## 2022-03-06 DIAGNOSIS — Z0000 Encounter for general adult medical examination without abnormal findings: Secondary | ICD-10-CM | POA: Diagnosis not present

## 2022-03-06 DIAGNOSIS — F419 Anxiety disorder, unspecified: Secondary | ICD-10-CM | POA: Diagnosis not present

## 2022-03-06 DIAGNOSIS — R7303 Prediabetes: Secondary | ICD-10-CM | POA: Diagnosis not present

## 2022-03-06 DIAGNOSIS — E7800 Pure hypercholesterolemia, unspecified: Secondary | ICD-10-CM | POA: Diagnosis not present

## 2022-03-06 DIAGNOSIS — K219 Gastro-esophageal reflux disease without esophagitis: Secondary | ICD-10-CM | POA: Diagnosis not present

## 2022-03-06 DIAGNOSIS — Z79899 Other long term (current) drug therapy: Secondary | ICD-10-CM | POA: Diagnosis not present

## 2022-03-06 DIAGNOSIS — Z1331 Encounter for screening for depression: Secondary | ICD-10-CM | POA: Diagnosis not present

## 2022-03-08 IMAGING — CT CT CARDIAC CORONARY ARTERY CALCIUM SCORE
3 series · 11 of 20 positions shown, 13 images · non-contrast
Comparison: None.

CLINICAL DATA: 65-year-old Caucasian female with history of
hyperlipidemia.

EXAM:
CT CARDIAC CORONARY ARTERY CALCIUM SCORE
TECHNIQUE: Non-contrast imaging through the heart was performed using
prospective ECG gating. Image post processing was performed on an
independent workstation, allowing for quantitative analysis of the
heart and coronary arteries. Note that this exam targets the heart
and the chest was not imaged in its entirety.

[Series 2: calcium scoring 2.00 qr36 bestdiast 71% hrt calciu · axial · 0.39mm/px · 1 of 70 slices shown]
[im 14/70  vessel]
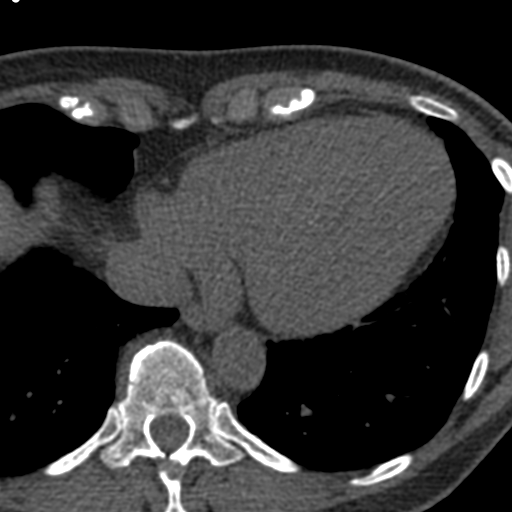

[Series 3: calcium scoring 2.00 br40 bestdiast 71% axial · axial · 0.46mm/px · z∈[+1403,+1495]mm · 5 of 70 slices shown, 7 images]
[im 12/70  vessel]
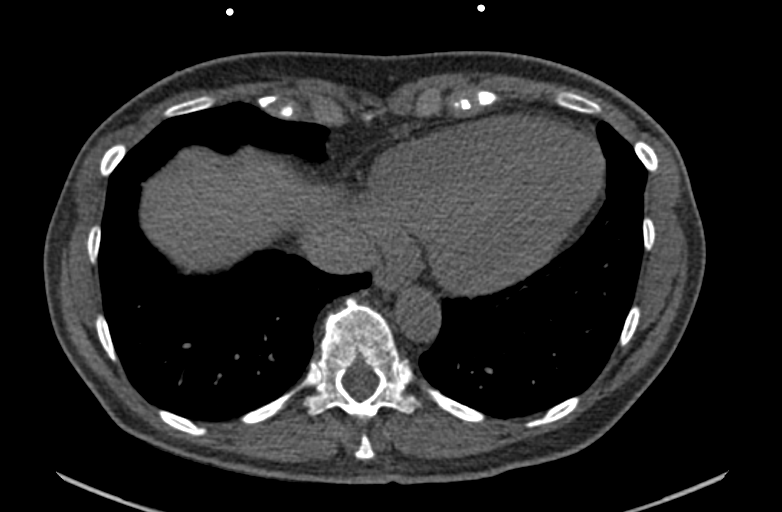
[im 12/70  lung]
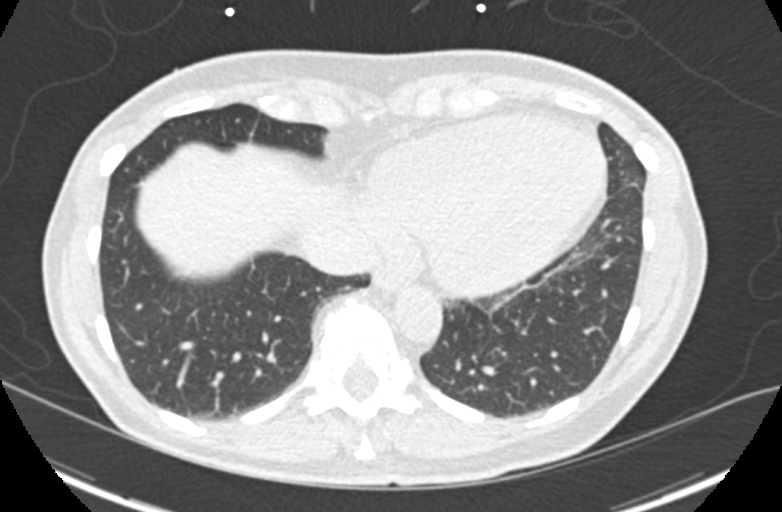
[im 24/70  vessel]
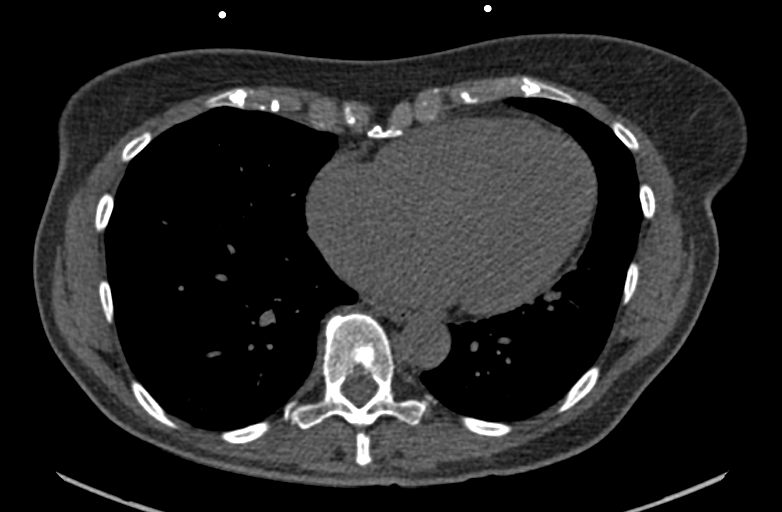
[im 35/70  vessel]
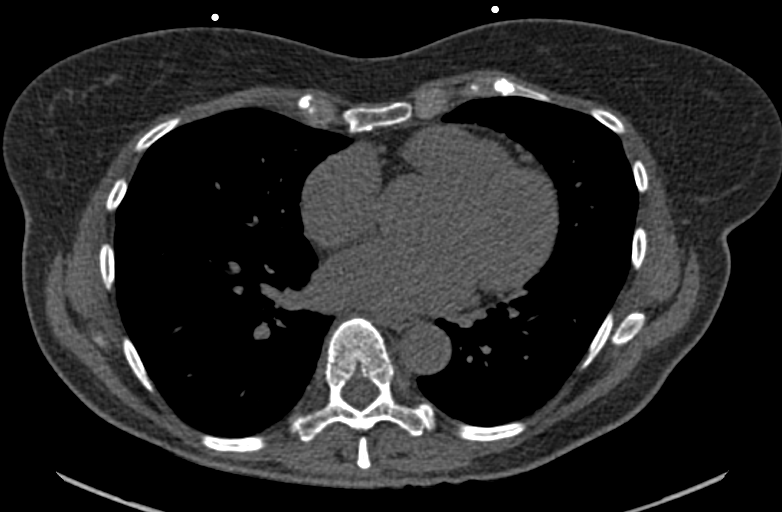
[im 47/70  vessel]
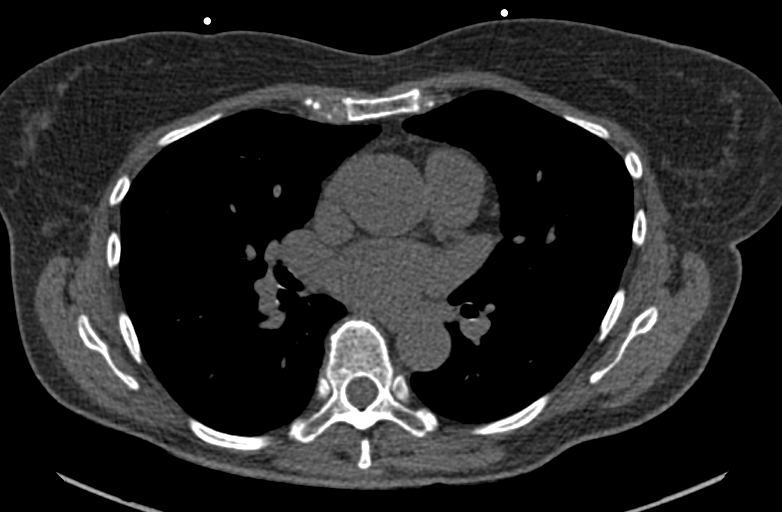
[im 58/70  vessel]
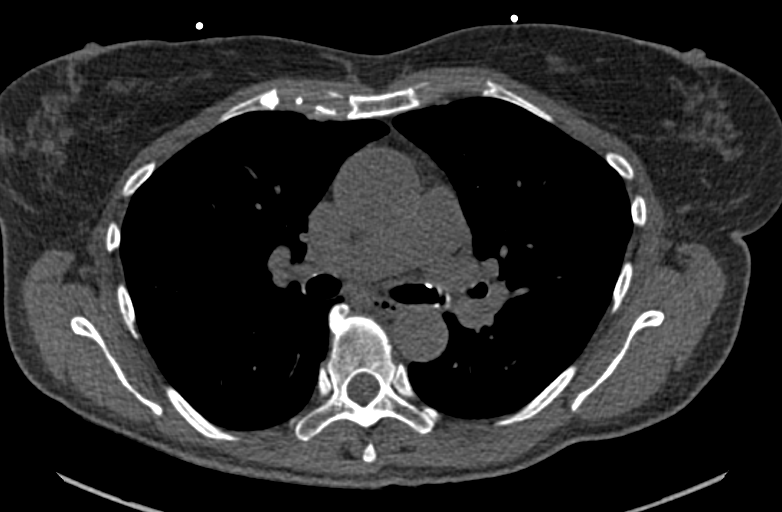
[im 58/70  lung]
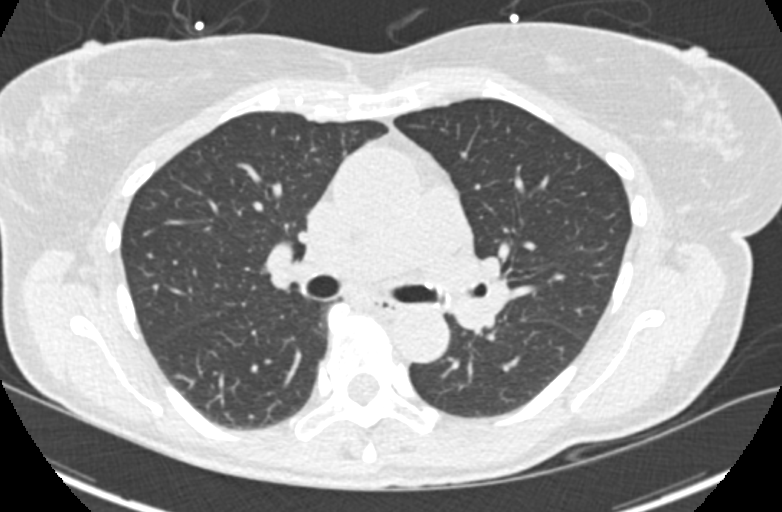

[Series 9: calcium scoring 2.00 br60 bestdiast 71% lungs · axial · 0.46mm/px · z∈[+1403,+1495]mm · 5 of 70 slices shown]
[im 12/70  vessel]
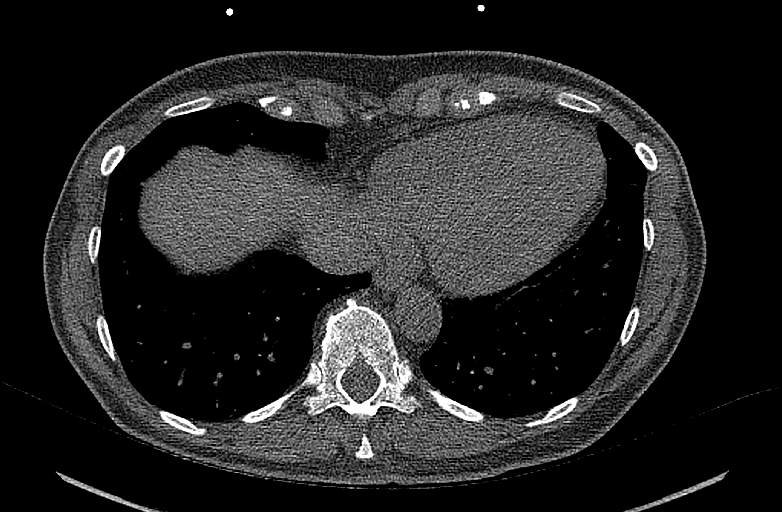
[im 24/70  vessel]
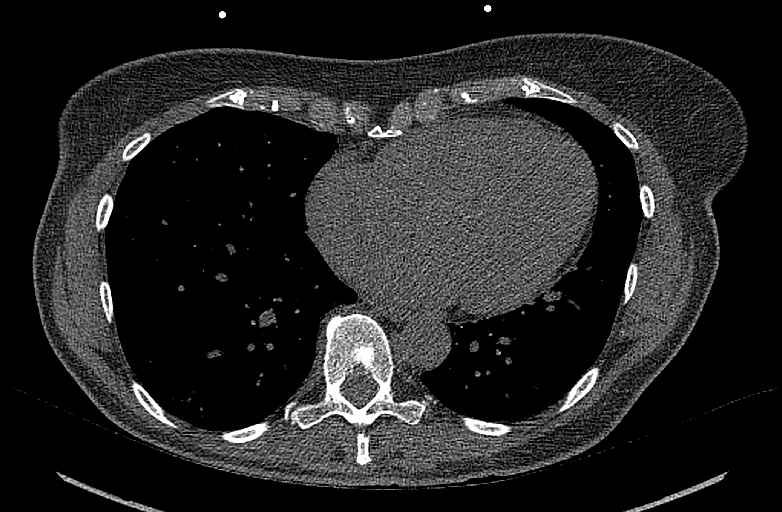
[im 35/70  vessel]
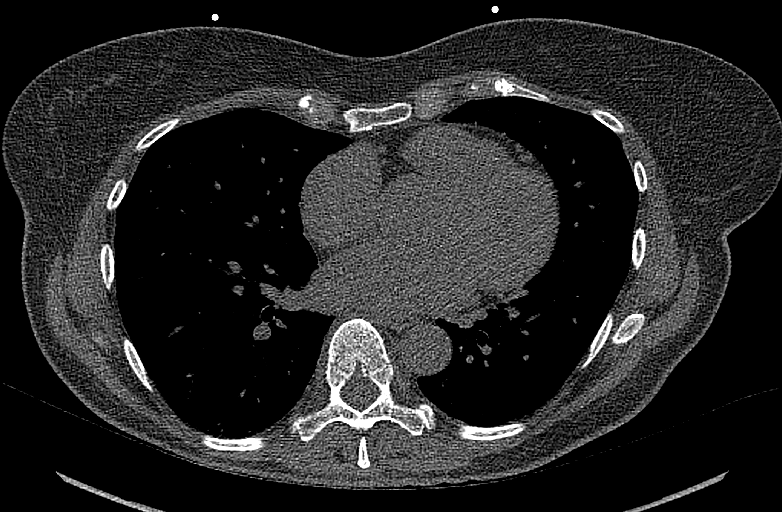
[im 47/70  vessel]
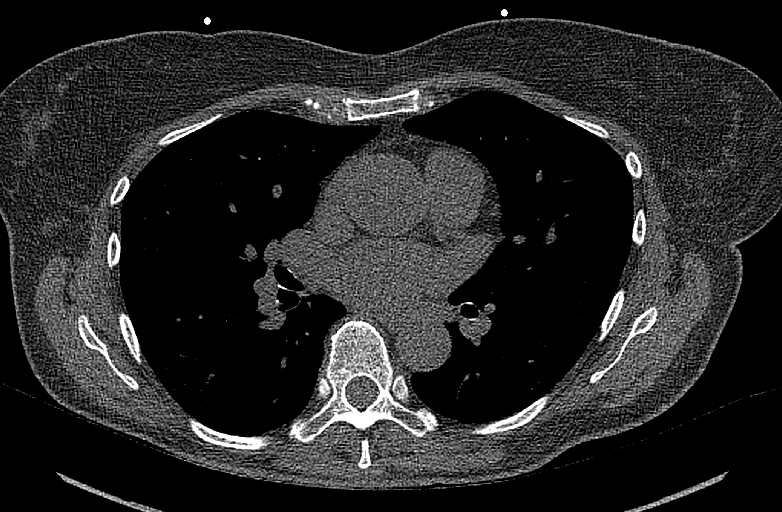
[im 58/70  vessel]
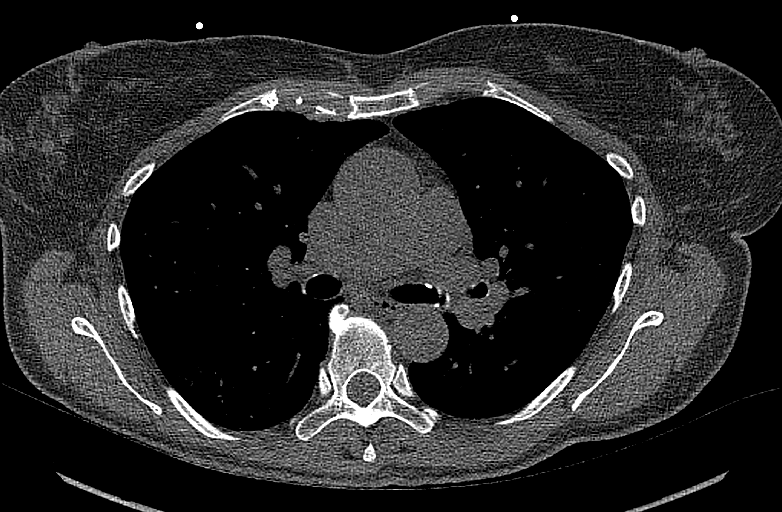

[11 of 20 positions shown; findings below may reference images not displayed]

FINDINGS: CORONARY CALCIUM SCORES:

Left Main: 0

LAD: 0

LCx: 0

RCA: 0

Total Agatston Score: 0

[HOSPITAL] percentile: 0

AORTA MEASUREMENTS:

Ascending Aorta: 38 mm

Descending Aorta: 24 mm

OTHER FINDINGS:

The heart size is within normal limits. No pericardial fluid is
identified. The ascending thoracic aorta is top-normal in caliber
measures up to approximately 3.8 cm by unenhanced CT. Visualized
central pulmonary arteries are normal in caliber. Visualized
mediastinum and hilar regions demonstrate no lymphadenopathy or
masses. Visualized lungs show no evidence of pulmonary edema,
consolidation, pneumothorax, nodule or pleural fluid. Visualized
upper abdomen and bony structures are unremarkable.
IMPRESSION: 1. Coronary calcium score of 0.
2. Top-normal caliber of the ascending thoracic aorta measuring
approximately 3.8 cm. Although this does not require routine imaging
follow-up based on diameter, correlation with echocardiography may
be helpful to assess the aortic valve.

## 2022-03-21 ENCOUNTER — Encounter (HOSPITAL_BASED_OUTPATIENT_CLINIC_OR_DEPARTMENT_OTHER): Payer: Self-pay | Admitting: Physical Therapy

## 2022-03-21 ENCOUNTER — Ambulatory Visit (HOSPITAL_BASED_OUTPATIENT_CLINIC_OR_DEPARTMENT_OTHER): Payer: PPO | Admitting: Physical Therapy

## 2022-03-21 DIAGNOSIS — M542 Cervicalgia: Secondary | ICD-10-CM

## 2022-03-21 NOTE — Therapy (Signed)
OUTPATIENT PHYSICAL THERAPY CERVICAL TREATMENT   Patient Name: Ashley Blair MRN: [id number] DOB:01/27/1956, 66 y.o., female Today's Date: 03/21/2022   PT End of Session - 03/21/22 1313     Visit Number 2    Number of Visits 13    Date for PT Re-Evaluation 06/02/22    Authorization Type HTA    PT Start Time 1230    PT Stop Time 1309    PT Time Calculation (min) 39 min    Activity Tolerance Patient tolerated treatment well;Patient limited by pain    Behavior During Therapy [facility] for tasks assessed/performed              Past Medical History:  Diagnosis Date   Elevated hemoglobin A1c 2019   5.7   Family history of DES exposure    Fibrocystic breast    GERD (gastroesophageal reflux disease)    HPV (human papilloma virus) infection    Hypercholesterolemia    OCD (obsessive compulsive disorder)    S/P excision of lipoma    Past Surgical History:  Procedure Laterality Date   ANKLE SURGERY     broken as a child   DILATION AND CURETTAGE OF UTERUS     LIPOMA EXCISION     rib cage   Patient Active Problem List   Diagnosis Date Noted   Family history of breast cancer in mother 12/12/2013   H/O diethylstilbestrol (DES) exposure in utero 12/12/2013    PCP:  Rankins, Bill Salinas, MD     REFERRING PROVIDER: Simona Huh, NP  REFERRING DIAG: M54.2 (ICD-10-CM) - Cervicalgia  THERAPY DIAG:  Cervicalgia  Rationale for Evaluation and Treatment Rehabilitation  ONSET DATE: 01/2022  SUBJECTIVE:                                                                                                                                                                                                         SUBJECTIVE STATEMENT: Tried doing the stretches but had pain down my arm on the return to center from the stretch. I think I have a bone spur at the base of my head.   PERTINENT HISTORY:  N/A  PAIN:  Are you having pain? Yes: NPRS scale: 3/10 Pain location: L lateral side of  C2-3 region Pain description: aching, cramping, dull, sharp, spasm Aggravating factors: movement Relieving factors: resting  PRECAUTIONS: None  WEIGHT BEARING RESTRICTIONS No  FALLS:  Has patient fallen in last 6 months? 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 Mild spinal canal stenosis at C3-4, C4-5, and C6-7. Multilevel neural foraminal narrowing, as described above.    PATIENT SURVEYS:  FOTO 63 66 @ DC  COGNITION: Overall cognitive status: Within functional limits for tasks assessed   SENSATION: WFL  POSTURE:  mild forward shoulders, L sided head tilt in seated position  PALPATION: TTP and hypertonicity of bilat C/S paraspinals, L UT and levator  Joint stiffness with extension and rotation with CPA and UPA glides from C2-6   CERVICAL ROM:   Active ROM A/PROM  eval  Flexion 90%  Extension 50%  Right lateral flexion 20%  Left lateral flexion 60%  Right rotation 45%  Left rotation 30%   (Blank rows = not tested)  UPPER EXTREMITY ROM: UE ROM WNL  UPPER EXTREMITY MMT:  MMT Right eval Left eval  Shoulder flexion 4+/5 4+/5  Shoulder abduction 4+/5 4+/5  Shoulder internal rotation 4+/5 4+/5  Grip strength 50 lbs, 52, 50 55lbs, 50, 45   (Blank rows = not tested)  CERVICAL SPECIAL TESTS:  Cranial cervical flexion test: Positive, Spurling's test: Negative, and Distraction test: Negative  C6 reflexes 2+ bilat C7 reflexes 2+ bilat  TODAY'S TREATMENT:   Trigger Point Dry Needling, Manual Therapy Treatment:  Initial or subsequent education regarding Trigger Point Dry Needling: Subsequent Did patient  give consent to treatment with Trigger Point Dry Needling: Yes TPDN with skilled palpation and monitoring followed by STM to the following muscles: bil upper traps, Rt levator scap, Lt C4 paraspinals, Lt splenius capitis.   In mirror- visual alignment to neutral of cspine with rotation- eyes staying forward Trial of heel lift in mirror paired with cervical alignment and rotation.  MANUAL: stretching of Lt suboccipitals, cervical traction     PATIENT EDUCATION:  Education details: Anatomy of condition, POC, HEP, exercise form/rationale, DN, heel lift Person educated: Patient Education method: Explanation, Demonstration, Tactile cues, Verbal cues, and Handouts Education comprehension: verbalized understanding, returned demonstration, verbal cues required, tactile cues required, and needs further education     HOME EXERCISE PROGRAM:  Access Code: [email or web address] URL: [email or web address]  ASSESSMENT:   CLINICAL IMPRESSION: Notable structural LLD- Rt shorter. Added heel lift that did improve motion control with slightly less pain at left suboccipital region. Asked her to wear heel lift consistently unless it increases concordant pain & will determine further need for heel lift & biomechanical chain training for proprioception.      OBJECTIVE IMPAIRMENTS decreased ROM, decreased strength, hypomobility, increased fascial restrictions, increased muscle spasms, impaired flexibility, improper body mechanics, postural dysfunction, and pain.    ACTIVITY LIMITATIONS carrying, lifting, sitting   PARTICIPATION LIMITATIONS: driving, shopping, community activity, occupation, and yard work   PERSONAL FACTORS Age, Time since onset of injury/illness/exacerbation, and 1 comorbidity:    are also affecting patient's functional outcome.    REHAB POTENTIAL: Good   CLINICAL DECISION MAKING: Stable/uncomplicated   EVALUATION COMPLEXITY: Low     GOALS:   SHORT TERM GOALS: Target date:  05/02/2022    Pt will become independent with HEP in order to demonstrate synthesis of PT education.  Goal status: INITIAL   2.  Pt will report at least 2 pt reduction on NPRS scale for pain in order to demonstrate functional improvement with household activity, self  care, and ADL.    Goal status: INITIAL   3.  Pt will be able to report abolishment of HA in order to demonstrate functional improvement in C/S function for self-care and house hold duties.    Goal status: INITIAL       LONG TERM GOALS: Target date: 06/19/2022    Pt  will become independent with final HEP in order to demonstrate synthesis of PT education.    Goal status: INITIAL   2.  Pt will be able to demonstrate full C/S ROM without pain in order to demonstrate functional improvement in C/s function for return to PLOF.   Goal status: INITIAL   3.  Pt will be able to demonstrate/report ability to sit/stand/sleep for extended periods of time without pain in order to demonstrate functional improvement and tolerance to static positioning.  Goal status: INITIAL  4.  Pt will score >/= 66 on FOTO to demonstrate improvement in perceived C/S function.  Baseline:  Goal status: INITIAL  PLAN: PT FREQUENCY: 1-2x/week   PT DURATION: 12 weeks (likely every other week, likely D/C in 8 wks)   PLANNED INTERVENTIONS: Therapeutic exercises, Therapeutic activity, Neuromuscular re-education, Balance training, Gait training, Patient/Family education, Self Care, Joint mobilization, Joint manipulation, Orthotic/Fit training, Aquatic Therapy, Dry Needling, Electrical stimulation, Spinal manipulation, Spinal mobilization, Cryotherapy, Moist heat, Compression bandaging, scar mobilization, Splintting, Taping, Vasopneumatic device, Traction, Ultrasound, Ionotophoresis '4mg'$ /ml Dexamethasone, Manual therapy, and Re-evaluation   PLAN FOR NEXT SESSION: DN PRN, outcome of heel lift??  Eldin Bonsell C. Taralee Marcus PT, DPT 03/21/22 1:14 PM

## 2022-03-25 ENCOUNTER — Ambulatory Visit (HOSPITAL_BASED_OUTPATIENT_CLINIC_OR_DEPARTMENT_OTHER): Payer: PPO | Admitting: Physical Therapy

## 2022-03-25 ENCOUNTER — Encounter (HOSPITAL_BASED_OUTPATIENT_CLINIC_OR_DEPARTMENT_OTHER): Payer: Self-pay | Admitting: Physical Therapy

## 2022-03-25 DIAGNOSIS — M542 Cervicalgia: Secondary | ICD-10-CM

## 2022-03-25 NOTE — Therapy (Signed)
OUTPATIENT PHYSICAL THERAPY CERVICAL TREATMENT   Patient Name: Ashley Blair MRN: [id number] DOB:12-13-1955, 66 y.o., female Today's Date: 03/25/2022   PT End of Session - 03/25/22 1557     Visit Number 3    Number of Visits 13    Date for PT Re-Evaluation 06/02/22    Authorization Type HTA    PT Start Time 1600    PT Stop Time 1640    PT Time Calculation (min) 40 min    Activity Tolerance Patient tolerated treatment well;Patient limited by pain    Behavior During Therapy [facility] for tasks assessed/performed              Past Medical History:  Diagnosis Date   Elevated hemoglobin A1c 2019   5.7   Family history of DES exposure    Fibrocystic breast    GERD (gastroesophageal reflux disease)    HPV (human papilloma virus) infection    Hypercholesterolemia    OCD (obsessive compulsive disorder)    S/P excision of lipoma    Past Surgical History:  Procedure Laterality Date   ANKLE SURGERY     broken as a child   DILATION AND CURETTAGE OF UTERUS     LIPOMA EXCISION     rib cage   Patient Active Problem List   Diagnosis Date Noted   Family history of breast cancer in mother 12/12/2013   H/O diethylstilbestrol (DES) exposure in utero 12/12/2013    PCP:  Rankins, Bill Salinas, MD     REFERRING PROVIDER: Simona Huh, NP  REFERRING DIAG: M54.2 (ICD-10-CM) - Cervicalgia  THERAPY DIAG:  Cervicalgia  Rationale for Evaluation and Treatment Rehabilitation  ONSET DATE: 01/2022  SUBJECTIVE:                                                                                                                                                                                                         SUBJECTIVE STATEMENT: Pt states she felt better after needling but still has a deep pain at base of her head on the L. The pain is still variable. Pt states that the heel lift helped her hip and back but no significant changes to neck. She states the sitting more level made a big  difference.   PERTINENT HISTORY:  N/A  PAIN:  Are you having pain? No: NPRS scale: 0/10 Pain location: L lateral side of C2-3 region Pain description: aching, cramping, dull, sharp, spasm Aggravating factors: movement Relieving factors: resting PRECAUTIONS: None  WEIGHT BEARING RESTRICTIONS No  FALLS:  Has patient fallen in last 6  months? 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 Mild spinal canal stenosis at C3-4, C4-5, and C6-7. Multilevel neural foraminal narrowing, as described above.    PATIENT SURVEYS:  FOTO 63 66 @ DC   TODAY'S TREATMENT:   8/22  Trigger Point Dry-Needling  Treatment instructions: Expect mild to moderate muscle soreness. S/S of pneumothorax if dry needled over a lung field, and to seek immediate medical attention should they occur. Patient verbalized understanding of these instructions and education.   Patient Consent Given: Yes Education (verbally/handout)provided: Yes Muscles treated: L C/S C3-5 paraspinals, L C3-5 C/S multifidi Electrical stimulation performed: No Treatment response/outcome: several LTR elicited  STM L C/S paraspinals, L UT Grade III C3-6 sideglide; UPA and CPA grade III C3-6 Manual traction 5 min  LAD with chin nod 10x Supine rotation 10x  Chin tuck with rotation 10x      Previous:  Trigger Point Dry Needling, Manual Therapy Treatment:  Initial or subsequent education regarding Trigger Point Dry Needling: Subsequent Did patient give consent to treatment with Trigger Point Dry Needling: Yes TPDN with skilled palpation and monitoring followed by STM to the  following muscles: bil upper traps, Rt levator scap, Lt C4 paraspinals, Lt splenius capitis.   In mirror- visual alignment to neutral of cspine with rotation- eyes staying forward Trial of heel lift in mirror paired with cervical alignment and rotation.  MANUAL: stretching of Lt suboccipitals, cervical traction     PATIENT EDUCATION:  Education details: Anatomy of condition, POC, HEP, exercise form/rationale, DN, heel lift Person educated: Patient Education method: Explanation, Demonstration, Tactile cues, Verbal cues, and Handouts Education comprehension: verbalized understanding, returned demonstration, verbal cues required, tactile cues required, and needs further education     HOME EXERCISE PROGRAM:  Access Code: [email or web address] URL: [email or web address]  ASSESSMENT:   CLINICAL IMPRESSION: Pt with continued soft tissue restriction at C/S L>R with R SB noted in seated. Pt with mild improvement in C/S SB following manual therapy and TPDN but is still largely stiff dominant. Pt with expected soreness after TPDN but no increase in distal pain. Plan to continue with joint mobilizations and TPDN PRN in order to reduce superficial joint restrictions. Pt has significant hypertonicity of soft tissue that is likely contributing to neck stiffness.  Pt would benefit from continued skilled therapy in order to reach goals and maximize functional C/S strength and ROM for return to PLOF.      OBJECTIVE IMPAIRMENTS decreased ROM, decreased strength, hypomobility, increased fascial restrictions, increased muscle spasms, impaired flexibility, improper body mechanics, postural dysfunction, and pain.    ACTIVITY LIMITATIONS carrying, lifting, sitting   PARTICIPATION LIMITATIONS: driving, shopping, community activity, occupation, and yard work   PERSONAL FACTORS Age, Time since onset of injury/illness/exacerbation, and 1 comorbidity:    are also affecting patient's functional outcome.     REHAB POTENTIAL: Good   CLINICAL DECISION MAKING: Stable/uncomplicated   EVALUATION COMPLEXITY: Low     GOALS:   SHORT TERM GOALS: Target date: 05/02/2022    Pt will become independent with HEP in order to demonstrate synthesis of PT education.  Goal status: INITIAL   2.  Pt will report at least 2 pt reduction on NPRS  scale for pain in order to demonstrate functional improvement with household activity, self care, and ADL.    Goal status: INITIAL   3.  Pt will be able to report abolishment of HA in order to demonstrate functional improvement in C/S function for self-care and house hold duties.    Goal status: INITIAL       LONG TERM GOALS: Target date: 06/19/2022    Pt  will become independent with final HEP in order to demonstrate synthesis of PT education.    Goal status: INITIAL   2.  Pt will be able to demonstrate full C/S ROM without pain in order to demonstrate functional improvement in C/s function for return to PLOF.   Goal status: INITIAL   3.  Pt will be able to demonstrate/report ability to sit/stand/sleep for extended periods of time without pain in order to demonstrate functional improvement and tolerance to static positioning.  Goal status: INITIAL  4.  Pt will score >/= 66 on FOTO to demonstrate improvement in perceived C/S function.  Baseline:  Goal status: INITIAL  PLAN: PT FREQUENCY: 1-2x/week   PT DURATION: 12 weeks (likely every other week, likely D/C in 8 wks)   PLANNED INTERVENTIONS: Therapeutic exercises, Therapeutic activity, Neuromuscular re-education, Balance training, Gait training, Patient/Family education, Self Care, Joint mobilization, Joint manipulation, Orthotic/Fit training, Aquatic Therapy, Dry Needling, Electrical stimulation, Spinal manipulation, Spinal mobilization, Cryotherapy, Moist heat, Compression bandaging, scar mobilization, Splintting, Taping, Vasopneumatic device, Traction, Ultrasound, Ionotophoresis '4mg'$ /ml Dexamethasone,  Manual therapy, and Re-evaluation   PLAN FOR NEXT SESSION: DN PRN, side glide joint mobs, rotational joint mobs, mob with movement  Daleen Bo PT, DPT 03/25/22 4:44 PM

## 2022-04-04 DIAGNOSIS — Z1231 Encounter for screening mammogram for malignant neoplasm of breast: Secondary | ICD-10-CM | POA: Diagnosis not present

## 2022-04-08 ENCOUNTER — Encounter: Payer: Self-pay | Admitting: Obstetrics and Gynecology

## 2022-04-09 ENCOUNTER — Encounter (HOSPITAL_BASED_OUTPATIENT_CLINIC_OR_DEPARTMENT_OTHER): Payer: Self-pay | Admitting: Physical Therapy

## 2022-04-09 ENCOUNTER — Ambulatory Visit (HOSPITAL_BASED_OUTPATIENT_CLINIC_OR_DEPARTMENT_OTHER): Payer: PPO | Attending: Family Medicine | Admitting: Physical Therapy

## 2022-04-09 DIAGNOSIS — M542 Cervicalgia: Secondary | ICD-10-CM | POA: Insufficient documentation

## 2022-04-09 NOTE — Therapy (Signed)
OUTPATIENT PHYSICAL THERAPY CERVICAL TREATMENT   Patient Name: Ashley Blair MRN: [id number] DOB:1955-12-14, 66 y.o., female Today's Date: 04/09/2022   PT End of Session - 04/09/22 1248     Visit Number 4    Number of Visits 13    Date for PT Re-Evaluation 06/02/22    Authorization Type HTA    PT Start Time 1147    PT Stop Time 1230    PT Time Calculation (min) 43 min    Activity Tolerance Patient tolerated treatment well;Patient limited by pain    Behavior During Therapy [facility] for tasks assessed/performed               Past Medical History:  Diagnosis Date   Elevated hemoglobin A1c 2019   5.7   Family history of DES exposure    Fibrocystic breast    GERD (gastroesophageal reflux disease)    HPV (human papilloma virus) infection    Hypercholesterolemia    OCD (obsessive compulsive disorder)    S/P excision of lipoma    Past Surgical History:  Procedure Laterality Date   ANKLE SURGERY     broken as a child   DILATION AND CURETTAGE OF UTERUS     LIPOMA EXCISION     rib cage   Patient Active Problem List   Diagnosis Date Noted   Family history of breast cancer in mother 12/12/2013   H/O diethylstilbestrol (DES) exposure in utero 12/12/2013    PCP:  Rankins, Bill Salinas, MD     REFERRING PROVIDER: Simona Huh, NP  REFERRING DIAG: M54.2 (ICD-10-CM) - Cervicalgia  THERAPY DIAG:  Cervicalgia  Rationale for Evaluation and Treatment Rehabilitation  ONSET DATE: 01/2022  SUBJECTIVE:                                                                                                                                                                                                         SUBJECTIVE STATEMENT: Pt states that it felt looser after last session but did have 3-4x of nerve pain coming down the arm. She did not she feel anything differrent compared to before. Pt felt it in the back of the arm. She feels it in her palm and arm. Pt hears lots popping and  cracking. She has less pain with rotation. Sitting still hurts/bothers it.   PERTINENT HISTORY:  N/A  PAIN:  Are you having pain? No: NPRS scale: 0/10 Pain location: L lateral side of C2-3 region Pain description: aching, cramping, dull, sharp, spasm Aggravating factors: movement Relieving factors: resting PRECAUTIONS: None  WEIGHT BEARING  RESTRICTIONS No  FALLS:  Has patient fallen in last 6 months? 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 radiology Degenerative changes of the cervical spine, more pronounced C5-6 where there is moderate spinal canal stenosis with mass effect on the cord without cord signal abnormality. Mild spinal canal stenosis at C3-4, C4-5, and C6-7. Multilevel neural foraminal narrowing, as described above.    PATIENT SURVEYS:  FOTO 63 66 @ DC   TODAY'S TREATMENT:   9/6  Trigger Point Dry-Needling  Treatment instructions: Expect mild to moderate muscle soreness. S/S of pneumothorax if dry needled over a lung field, and to seek immediate medical attention should they occur. Patient verbalized understanding of these instructions and education.   Patient Consent Given: Yes Education (verbally/handout)provided: Yes Muscles treated: bilat C/S C3-5 paraspinals, bilat C3-5 C/S multifidi Electrical stimulation performed: No Treatment response/outcome: several strong LTR elicited  STM L C/S paraspinals, L UT Grade III C3-6 sideglide; UPA and CPA grade III C3-6 Manual traction 5 min  Median n. Glide 10x  Edu provided about exercise management, postural changes, and mental/emotional status affecting pain  8/22  Trigger Point Dry-Needling  Treatment instructions: Expect mild to moderate muscle soreness. S/S of pneumothorax if dry needled over a lung field, and to seek immediate  medical attention should they occur. Patient verbalized understanding of these instructions and education.   Patient Consent Given: Yes Education (verbally/handout)provided: Yes Muscles treated: L C/S C3-5 paraspinals, L C3-5 C/S multifidi Electrical stimulation performed: No Treatment response/outcome: several LTR elicited  STM L C/S paraspinals, L UT Grade III C3-6 sideglide; UPA and CPA grade III C3-6 Manual traction 5 min  LAD with chin nod 10x Supine rotation 10x  Chin tuck with rotation 10x      Previous:  Trigger Point Dry Needling, Manual Therapy Treatment:  Initial or subsequent education regarding Trigger Point Dry Needling: Subsequent Did patient give consent to treatment with Trigger Point Dry Needling: Yes TPDN with skilled palpation and monitoring followed by STM to the following muscles: bil upper traps, Rt levator scap, Lt C4 paraspinals, Lt splenius capitis.   In mirror- visual alignment to neutral of cspine with rotation- eyes staying forward Trial of heel lift in mirror paired with cervical alignment and rotation.  MANUAL: stretching of Lt suboccipitals, cervical traction     PATIENT EDUCATION:  Education details: Anatomy of condition, POC, HEP, exercise form/rationale, DN, heel lift Person educated: Patient Education method: Explanation, Demonstration, Tactile cues, Verbal cues, and Handouts Education comprehension: verbalized understanding, returned demonstration, verbal cues required, tactile cues required, and needs further education     HOME EXERCISE PROGRAM:  Access Code: [email or web address] URL: [email or web address]  ASSESSMENT:   CLINICAL IMPRESSION: Pt with with mild improvement in neck stiffness following joint mobilization but had palpable change in tissue tension and length following TPDN and manual therapy. Pt with abolishment of nerve pain into palm today following nerve glide so HEP updated at this time. Pt tolerated TPDN bilaterally  well today. Plan to assess for response at next session and consider addition of prone PA and CPA to facilitate better rotational movements. Pt gave verbal understanding to edu about self STM to R infra to assist in reducing UE referred pain. Pt would benefit from continued skilled therapy in order to reach goals and maximize functional C/S strength and ROM for return to PLOF.  OBJECTIVE IMPAIRMENTS decreased ROM, decreased strength, hypomobility, increased fascial restrictions, increased muscle spasms, impaired flexibility, improper body mechanics, postural dysfunction, and pain.    ACTIVITY LIMITATIONS carrying, lifting, sitting   PARTICIPATION LIMITATIONS: driving, shopping, community activity, occupation, and yard work   PERSONAL FACTORS Age, Time since onset of injury/illness/exacerbation, and 1 comorbidity:    are also affecting patient's functional outcome.    REHAB POTENTIAL: Good   CLINICAL DECISION MAKING: Stable/uncomplicated   EVALUATION COMPLEXITY: Low     GOALS:   SHORT TERM GOALS: Target date: 05/02/2022    Pt will become independent with HEP in order to demonstrate synthesis of PT education.  Goal status: INITIAL   2.  Pt will report at least 2 pt reduction on NPRS scale for pain in order to demonstrate functional improvement with household activity, self care, and ADL.    Goal status: INITIAL   3.  Pt will be able to report abolishment of HA in order to demonstrate functional improvement in C/S function for self-care and house hold duties.    Goal status: INITIAL       LONG TERM GOALS: Target date: 06/19/2022    Pt  will become independent with final HEP in order to demonstrate synthesis of PT education.    Goal status: INITIAL   2.  Pt will be able to demonstrate full C/S ROM without pain in order to demonstrate functional improvement in C/s function for return to PLOF.   Goal status: INITIAL   3.  Pt will be able to demonstrate/report ability to  sit/stand/sleep for extended periods of time without pain in order to demonstrate functional improvement and tolerance to static positioning.  Goal status: INITIAL  4.  Pt will score >/= 66 on FOTO to demonstrate improvement in perceived C/S function.  Baseline:  Goal status: INITIAL  PLAN: PT FREQUENCY: 1-2x/week   PT DURATION: 12 weeks (likely every other week, likely D/C in 8 wks)   PLANNED INTERVENTIONS: Therapeutic exercises, Therapeutic activity, Neuromuscular re-education, Balance training, Gait training, Patient/Family education, Self Care, Joint mobilization, Joint manipulation, Orthotic/Fit training, Aquatic Therapy, Dry Needling, Electrical stimulation, Spinal manipulation, Spinal mobilization, Cryotherapy, Moist heat, Compression bandaging, scar mobilization, Splintting, Taping, Vasopneumatic device, Traction, Ultrasound, Ionotophoresis '4mg'$ /ml Dexamethasone, Manual therapy, and Re-evaluation   PLAN FOR NEXT SESSION: DN PRN, side glide joint mobs, rotational joint mobs, mob with movement  Daleen Bo PT, DPT 04/09/22 12:56 PM

## 2022-04-14 ENCOUNTER — Encounter (HOSPITAL_BASED_OUTPATIENT_CLINIC_OR_DEPARTMENT_OTHER): Payer: Self-pay | Admitting: Physical Therapy

## 2022-04-14 ENCOUNTER — Ambulatory Visit (HOSPITAL_BASED_OUTPATIENT_CLINIC_OR_DEPARTMENT_OTHER): Payer: PPO | Admitting: Physical Therapy

## 2022-04-14 DIAGNOSIS — M542 Cervicalgia: Secondary | ICD-10-CM

## 2022-04-14 NOTE — Therapy (Signed)
OUTPATIENT PHYSICAL THERAPY CERVICAL TREATMENT   Patient Name: Ashley Blair MRN: [id number] DOB:November 12, 1955, 66 y.o., female Today's Date: 04/14/2022   PT End of Session - 04/14/22 1649     Visit Number 5    Number of Visits 13    Date for PT Re-Evaluation 06/02/22    Authorization Type HTA    PT Start Time 1600    PT Stop Time 1642    PT Time Calculation (min) 42 min    Activity Tolerance Patient tolerated treatment well;Patient limited by pain    Behavior During Therapy [facility] for tasks assessed/performed                Past Medical History:  Diagnosis Date   Elevated hemoglobin A1c 2019   5.7   Family history of DES exposure    Fibrocystic breast    GERD (gastroesophageal reflux disease)    HPV (human papilloma virus) infection    Hypercholesterolemia    OCD (obsessive compulsive disorder)    S/P excision of lipoma    Past Surgical History:  Procedure Laterality Date   ANKLE SURGERY     broken as a child   DILATION AND CURETTAGE OF UTERUS     LIPOMA EXCISION     rib cage   Patient Active Problem List   Diagnosis Date Noted   Family history of breast cancer in mother 12/12/2013   H/O diethylstilbestrol (DES) exposure in utero 12/12/2013    PCP:  Aretta Nip, MD     REFERRING PROVIDER: Simona Huh, NP  REFERRING DIAG: M54.2 (ICD-10-CM) - Cervicalgia  THERAPY DIAG:  Cervicalgia  Rationale for Evaluation and Treatment Rehabilitation  ONSET DATE: 01/2022  SUBJECTIVE:                                                                                                                                                                                                         SUBJECTIVE STATEMENT: Pt states that she felt better after last session. She is now able to turn L and return now without. She no longer has NT like before.   PERTINENT HISTORY:  N/A  PAIN:  Are you having pain? No: NPRS scale: 0/10 Pain location: L lateral side of C2-3  region Pain description: aching, cramping, dull, sharp, spasm Aggravating factors: movement Relieving factors: resting PRECAUTIONS: None  WEIGHT BEARING RESTRICTIONS No  FALLS:  Has patient fallen in last 6 months? No  LIVING ENVIRONMENT: Lives with: lives with their family  OCCUPATION: former fitness instructor  PLOF: Independent  PATIENT GOALS : reduce  pain  OBJECTIVE:   DIAGNOSTIC FINDINGS:  MRI Impression: from [facility] radiology Degenerative changes of the cervical spine, more pronounced C5-6 where there is moderate spinal canal stenosis with mass effect on the cord without cord signal abnormality. Mild spinal canal stenosis at C3-4, C4-5, and C6-7. Multilevel neural foraminal narrowing, as described above.    PATIENT SURVEYS:  FOTO 63 66 @ DC   TODAY'S TREATMENT:    9/11   Trigger Point Dry-Needling  Treatment instructions: Expect mild to moderate muscle soreness. S/S of pneumothorax if dry needled over a lung field, and to seek immediate medical attention should they occur. Patient verbalized understanding of these instructions and education.   Patient Consent Given: Yes Education (verbally/handout)provided: Yes Muscles treated: bilat L UT, bilat C3-5 C/S multifidi Electrical stimulation performed: No Treatment response/outcome: several strong LTR elicited  STM L C/S paraspinals, L UT Grade IV C3-6 sideglide; Prone UPA and CPA grade III-IV C3-6  C/S SNAG rotional 10x each way Bilat RTB ER 2x10  Edu provided about exercise management 9/6  Trigger Point Dry-Needling  Treatment instructions: Expect mild to moderate muscle soreness. S/S of pneumothorax if dry needled over a lung field, and to seek immediate medical attention should they occur. Patient verbalized understanding of these instructions and education.   Patient Consent Given: Yes Education (verbally/handout)provided: Yes Muscles treated: bilat C/S C3-5 paraspinals, bilat C3-5 C/S  multifidi Electrical stimulation performed: No Treatment response/outcome: several strong LTR elicited  STM L C/S paraspinals, L UT Grade III C3-6 sideglide; UPA and CPA grade III C3-6 Manual traction 5 min  Median n. Glide 10x  Edu provided about exercise management, postural changes, and mental/emotional status affecting pain  8/22  Trigger Point Dry-Needling  Treatment instructions: Expect mild to moderate muscle soreness. S/S of pneumothorax if dry needled over a lung field, and to seek immediate medical attention should they occur. Patient verbalized understanding of these instructions and education.   Patient Consent Given: Yes Education (verbally/handout)provided: Yes Muscles treated: L C/S C3-5 paraspinals, L C3-5 C/S multifidi Electrical stimulation performed: No Treatment response/outcome: several LTR elicited  STM L C/S paraspinals, L UT Grade III C3-6 sideglide; UPA and CPA grade III C3-6 Manual traction 5 min  LAD with chin nod 10x Supine rotation 10x  Chin tuck with rotation 10x      Previous:  Trigger Point Dry Needling, Manual Therapy Treatment:  Initial or subsequent education regarding Trigger Point Dry Needling: Subsequent Did patient give consent to treatment with Trigger Point Dry Needling: Yes TPDN with skilled palpation and monitoring followed by STM to the following muscles: bil upper traps, Rt levator scap, Lt C4 paraspinals, Lt splenius capitis.   In mirror- visual alignment to neutral of cspine with rotation- eyes staying forward Trial of heel lift in mirror paired with cervical alignment and rotation.  MANUAL: stretching of Lt suboccipitals, cervical traction     PATIENT EDUCATION:  Education details: Anatomy of condition, POC, HEP, exercise form/rationale, DN, heel lift Person educated: Patient Education method: Explanation, Demonstration, Tactile cues, Verbal cues, and Handouts Education comprehension: verbalized understanding, returned  demonstration, verbal cues required, tactile cues required, and needs further education     HOME EXERCISE PROGRAM:  Access Code: [email or web address] URL: [email or web address]  ASSESSMENT:   CLINICAL IMPRESSION: Pt with 10 degree improvement in C/S L rotation following joint mobilization today. Pt with good results with prone PA and tolerated mobilization well without increased pain. Plan to decrease frequency of  visits and continue with end range joint mobs  in order to continue with ROM improvements. Pt has improved pain with sessions and updated HEP. Pt given edu about expectations with C/S OA and rehab progression. Pt gave verbal understanding to edu about self STM to R infra to assist in reducing UE referred pain. Pt would benefit from continued skilled therapy in order to reach goals and maximize functional C/S strength and ROM for return to PLOF.      OBJECTIVE IMPAIRMENTS decreased ROM, decreased strength, hypomobility, increased fascial restrictions, increased muscle spasms, impaired flexibility, improper body mechanics, postural dysfunction, and pain.    ACTIVITY LIMITATIONS carrying, lifting, sitting   PARTICIPATION LIMITATIONS: driving, shopping, community activity, occupation, and yard work   PERSONAL FACTORS Age, Time since onset of injury/illness/exacerbation, and 1 comorbidity:    are also affecting patient's functional outcome.    REHAB POTENTIAL: Good   CLINICAL DECISION MAKING: Stable/uncomplicated   EVALUATION COMPLEXITY: Low     GOALS:   SHORT TERM GOALS: Target date: 05/02/2022    Pt will become independent with HEP in order to demonstrate synthesis of PT education.  Goal status: MET   2.  Pt will report at least 2 pt reduction on NPRS scale for pain in order to demonstrate functional improvement with household activity, self care, and ADL.    Goal status: MET   3.  Pt will be able to report abolishment of HA in order to demonstrate functional  improvement in C/S function for self-care and house hold duties.    Goal status: MET       LONG TERM GOALS: Target date: 06/19/2022    Pt  will become independent with final HEP in order to demonstrate synthesis of PT education.    Goal status: ongoing   2.  Pt will be able to demonstrate full C/S ROM without pain in order to demonstrate functional improvement in C/s function for return to PLOF.   Goal status: ongoing   3.  Pt will be able to demonstrate/report ability to sit/stand/sleep for extended periods of time without pain in order to demonstrate functional improvement and tolerance to static positioning.  Goal status: ongoing  4.  Pt will score >/= 66 on FOTO to demonstrate improvement in perceived C/S function.  Baseline:  Goal status: ongoing  PLAN: PT FREQUENCY: 1-2x/week   PT DURATION: 12 weeks (likely every other week, likely D/C in 8 wks)   PLANNED INTERVENTIONS: Therapeutic exercises, Therapeutic activity, Neuromuscular re-education, Balance training, Gait training, Patient/Family education, Self Care, Joint mobilization, Joint manipulation, Orthotic/Fit training, Aquatic Therapy, Dry Needling, Electrical stimulation, Spinal manipulation, Spinal mobilization, Cryotherapy, Moist heat, Compression bandaging, scar mobilization, Splintting, Taping, Vasopneumatic device, Traction, Ultrasound, Ionotophoresis 30m/ml Dexamethasone, Manual therapy, and Re-evaluation   PLAN FOR NEXT SESSION: DN PRN, side glide joint mobs, rotational joint mobs, mob with movement  ADaleen BoPT, DPT 04/14/22 4:49 PM

## 2022-04-23 ENCOUNTER — Encounter (HOSPITAL_BASED_OUTPATIENT_CLINIC_OR_DEPARTMENT_OTHER): Payer: PPO | Admitting: Physical Therapy

## 2022-04-30 ENCOUNTER — Other Ambulatory Visit (HOSPITAL_BASED_OUTPATIENT_CLINIC_OR_DEPARTMENT_OTHER): Payer: Self-pay

## 2022-04-30 ENCOUNTER — Encounter (HOSPITAL_BASED_OUTPATIENT_CLINIC_OR_DEPARTMENT_OTHER): Payer: Self-pay | Admitting: Physical Therapy

## 2022-04-30 ENCOUNTER — Ambulatory Visit (HOSPITAL_BASED_OUTPATIENT_CLINIC_OR_DEPARTMENT_OTHER): Payer: PPO | Admitting: Physical Therapy

## 2022-04-30 DIAGNOSIS — M4312 Spondylolisthesis, cervical region: Secondary | ICD-10-CM | POA: Diagnosis not present

## 2022-04-30 DIAGNOSIS — M542 Cervicalgia: Secondary | ICD-10-CM

## 2022-04-30 MED ORDER — INFLUENZA VAC A&B SA ADJ QUAD 0.5 ML IM PRSY
PREFILLED_SYRINGE | INTRAMUSCULAR | 0 refills | Status: DC
  Filled 2022-04-30: qty 0.5, 1d supply, fill #0

## 2022-04-30 NOTE — Therapy (Signed)
OUTPATIENT PHYSICAL THERAPY CERVICAL TREATMENT   Patient Name: Ashley Blair MRN: [id number] DOB:Jan 18, 1956, 66 y.o., female Today's Date: 04/30/2022   PT End of Session - 04/30/22 1140     Visit Number 4    Number of Visits 13    Date for PT Re-Evaluation 06/02/22    Authorization Type HTA    PT Start Time 1145    PT Stop Time 1234    PT Time Calculation (min) 49 min    Activity Tolerance Patient tolerated treatment well;Patient limited by pain    Behavior During Therapy [facility] for tasks assessed/performed                Past Medical History:  Diagnosis Date   Elevated hemoglobin A1c 2019   5.7   Family history of DES exposure    Fibrocystic breast    GERD (gastroesophageal reflux disease)    HPV (human papilloma virus) infection    Hypercholesterolemia    OCD (obsessive compulsive disorder)    S/P excision of lipoma    Past Surgical History:  Procedure Laterality Date   ANKLE SURGERY     broken as a child   DILATION AND CURETTAGE OF UTERUS     LIPOMA EXCISION     rib cage   Patient Active Problem List   Diagnosis Date Noted   Family history of breast cancer in mother 12/12/2013   H/O diethylstilbestrol (DES) exposure in utero 12/12/2013    PCP:  Aretta Nip, MD     REFERRING PROVIDER: Simona Huh, NP  REFERRING DIAG: M54.2 (ICD-10-CM) - Cervicalgia  THERAPY DIAG:  Cervicalgia  Rationale for Evaluation and Treatment Rehabilitation  ONSET DATE: 01/2022  SUBJECTIVE:                                                                                                                                                                                                         SUBJECTIVE STATEMENT: Having occasional pain down to Lt thumb. Getting hair cut made my neck hurt. Trigger point noted in Lt upper trpa.   PERTINENT HISTORY:  N/A  PAIN:  Are you having pain? No: NPRS scale: 0/10 Pain location: L lateral side of C2-3 region Pain  description: aching, cramping, dull, sharp, spasm Aggravating factors: movement Relieving factors: resting PRECAUTIONS: None  WEIGHT BEARING RESTRICTIONS No  FALLS:  Has patient fallen in last 6 months? No  LIVING ENVIRONMENT: Lives with: lives with their family  OCCUPATION: former fitness instructor  PLOF: Independent  PATIENT GOALS : reduce pain  OBJECTIVE:   DIAGNOSTIC  FINDINGS:  MRI Impression: from [facility] radiology Degenerative changes of the cervical spine, more pronounced C5-6 where there is moderate spinal canal stenosis with mass effect on the cord without cord signal abnormality. Mild spinal canal stenosis at C3-4, C4-5, and C6-7. Multilevel neural foraminal narrowing, as described above.    PATIENT SURVEYS:  FOTO 63 66 @ DC   TODAY'S TREATMENT:   9/27:  Trigger Point Dry Needling, Manual Therapy Treatment:  Initial or subsequent education regarding Trigger Point Dry Needling: Subsequent Did patient give consent to treatment with Trigger Point Dry Needling: Yes TPDN with skilled palpation and monitoring followed by STM to the following muscles: Lt upper trap, Lt levator scapula Prone rib and thoracic spine mobility, Lt first rib depression/ER    Seated cervical rotation- in chin tuck/open facets Upper trap stretch, levator stretch 1/2 foam roll with head turns  9/11   Trigger Point Dry-Needling  Treatment instructions: Expect mild to moderate muscle soreness. S/S of pneumothorax if dry needled over a lung field, and to seek immediate medical attention should they occur. Patient verbalized understanding of these instructions and education.   Patient Consent Given: Yes Education (verbally/handout)provided: Yes Muscles treated: bilat L UT, bilat C3-5 C/S multifidi Electrical stimulation performed: No Treatment response/outcome: several strong LTR elicited  STM L C/S paraspinals, L UT Grade IV C3-6 sideglide; Prone UPA and CPA grade III-IV C3-6  C/S  SNAG rotional 10x each way Bilat RTB ER 2x10  Edu provided about exercise management 9/6  Trigger Point Dry-Needling  Treatment instructions: Expect mild to moderate muscle soreness. S/S of pneumothorax if dry needled over a lung field, and to seek immediate medical attention should they occur. Patient verbalized understanding of these instructions and education.   Patient Consent Given: Yes Education (verbally/handout)provided: Yes Muscles treated: bilat C/S C3-5 paraspinals, bilat C3-5 C/S multifidi Electrical stimulation performed: No Treatment response/outcome: several strong LTR elicited  STM L C/S paraspinals, L UT Grade III C3-6 sideglide; UPA and CPA grade III C3-6 Manual traction 5 min  Median n. 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 Limitation in Lt rotation is from joint mobility but will continue to work to increase ROM in least painful manner.    OBJECTIVE IMPAIRMENTS decreased ROM, decreased strength, hypomobility, increased fascial restrictions, increased muscle spasms, impaired flexibility, improper body mechanics, postural dysfunction, and pain.    ACTIVITY LIMITATIONS carrying, lifting, sitting   PARTICIPATION LIMITATIONS: driving, shopping, community activity, occupation, and yard work   PERSONAL FACTORS Age, Time since onset of  injury/illness/exacerbation, and 1 comorbidity:    are also affecting patient's functional outcome.    REHAB POTENTIAL: Good   CLINICAL DECISION MAKING: Stable/uncomplicated   EVALUATION COMPLEXITY: Low     GOALS:   SHORT TERM GOALS: Target date: 05/02/2022    Pt will become independent with HEP in order to demonstrate synthesis of PT education.  Goal status: MET   2.  Pt will report at least 2 pt reduction on NPRS scale for pain in order to demonstrate functional improvement  with household activity, self care, and ADL.    Goal status: MET   3.  Pt will be able to report abolishment of HA in order to demonstrate functional improvement in C/S function for self-care and house hold duties.    Goal status: MET       LONG TERM GOALS: Target date: 06/19/2022    Pt  will become independent with final HEP in order to demonstrate synthesis of PT education.    Goal status: ongoing   2.  Pt will be able to demonstrate full C/S ROM without pain in order to demonstrate functional improvement in C/s function for return to PLOF.   Goal status: ongoing   3.  Pt will be able to demonstrate/report ability to sit/stand/sleep for extended periods of time without pain in order to demonstrate functional improvement and tolerance to static positioning.  Goal status: ongoing  4.  Pt will score >/= 66 on FOTO to demonstrate improvement in perceived C/S function.  Baseline:  Goal status: ongoing  PLAN: PT FREQUENCY: 1-2x/week   PT DURATION: 12 weeks (likely every other week, likely D/C in 8 wks)   PLANNED INTERVENTIONS: Therapeutic exercises, Therapeutic activity, Neuromuscular re-education, Balance training, Gait training, Patient/Family education, Self Care, Joint mobilization, Joint manipulation, Orthotic/Fit training, Aquatic Therapy, Dry Needling, Electrical stimulation, Spinal manipulation, Spinal mobilization, Cryotherapy, Moist heat, Compression bandaging, scar mobilization,  Splintting, Taping, Vasopneumatic device, Traction, Ultrasound, Ionotophoresis 50m/ml Dexamethasone, Manual therapy, and Re-evaluation   PLAN FOR NEXT SESSION: DN PRN, side glide joint mobs, rotational joint mobs, mob with movement Markea Ruzich C. Manasvini Whatley PT, DPT 04/30/22 2:23 PM

## 2022-05-07 ENCOUNTER — Encounter (HOSPITAL_BASED_OUTPATIENT_CLINIC_OR_DEPARTMENT_OTHER): Payer: PPO | Admitting: Physical Therapy

## 2022-05-08 ENCOUNTER — Encounter (HOSPITAL_BASED_OUTPATIENT_CLINIC_OR_DEPARTMENT_OTHER): Payer: Self-pay | Admitting: Physical Therapy

## 2022-05-08 ENCOUNTER — Ambulatory Visit (HOSPITAL_BASED_OUTPATIENT_CLINIC_OR_DEPARTMENT_OTHER): Payer: PPO | Attending: Family Medicine | Admitting: Physical Therapy

## 2022-05-08 DIAGNOSIS — M25561 Pain in right knee: Secondary | ICD-10-CM | POA: Insufficient documentation

## 2022-05-08 DIAGNOSIS — G8929 Other chronic pain: Secondary | ICD-10-CM | POA: Insufficient documentation

## 2022-05-08 DIAGNOSIS — M25562 Pain in left knee: Secondary | ICD-10-CM | POA: Diagnosis not present

## 2022-05-08 DIAGNOSIS — M542 Cervicalgia: Secondary | ICD-10-CM | POA: Diagnosis not present

## 2022-05-08 NOTE — Therapy (Signed)
OUTPATIENT PHYSICAL THERAPY CERVICAL TREATMENT   Patient Name: Ashley Blair MRN: [id number] DOB:09-08-1955, 66 y.o., female Today's Date: 05/08/2022   PT End of Session - 05/08/22 0810     Visit Number 7    Number of Visits 13    Date for PT Re-Evaluation 06/02/22    Authorization Type HTA    PT Start Time 0805    PT Stop Time 0835    PT Time Calculation (min) 30 min    Activity Tolerance Patient tolerated treatment well;Patient limited by pain    Behavior During Therapy [facility] for tasks assessed/performed                Past Medical History:  Diagnosis Date   Elevated hemoglobin A1c 2019   5.7   Family history of DES exposure    Fibrocystic breast    GERD (gastroesophageal reflux disease)    HPV (human papilloma virus) infection    Hypercholesterolemia    OCD (obsessive compulsive disorder)    S/P excision of lipoma    Past Surgical History:  Procedure Laterality Date   ANKLE SURGERY     broken as a child   DILATION AND CURETTAGE OF UTERUS     LIPOMA EXCISION     rib cage   Patient Active Problem List   Diagnosis Date Noted   Family history of breast cancer in mother 12/12/2013   H/O diethylstilbestrol (DES) exposure in utero 12/12/2013    PCP:  Rankins, Bill Salinas, MD     REFERRING PROVIDER: Simona Huh, NP  REFERRING DIAG: M54.2 (ICD-10-CM) - Cervicalgia  THERAPY DIAG:  Cervicalgia  Rationale for Evaluation and Treatment Rehabilitation  ONSET DATE: 01/2022  SUBJECTIVE:                                                                                                                                                                                                         SUBJECTIVE STATEMENT: Pt states she has tightniess and pain into the L side of her neck again. Again in the UT  PERTINENT HISTORY:  N/A  PAIN:  Are you having pain? Yes: NPRS scale: 2/10 Pain location: L lateral side of C2-3 region Pain description: aching, cramping,  dull, sharp, spasm Aggravating factors: movement Relieving factors: resting PRECAUTIONS: None  WEIGHT BEARING RESTRICTIONS No  FALLS:  Has patient fallen in last 6 months? No  LIVING ENVIRONMENT: Lives with: lives with their family  OCCUPATION: former fitness instructor  PLOF: Independent  PATIENT GOALS : reduce pain  OBJECTIVE:   DIAGNOSTIC FINDINGS:  MRI  Impression: from [facility] radiology Degenerative changes of the cervical spine, more pronounced C5-6 where there is moderate spinal canal stenosis with mass effect on the cord without cord signal abnormality. Mild spinal canal stenosis at C3-4, C4-5, and C6-7. Multilevel neural foraminal narrowing, as described above.    PATIENT SURVEYS:  FOTO 63 66 @ DC   TODAY'S TREATMENT:   10/5:  Trigger Point Dry Needling, Manual Therapy Treatment:  Initial or subsequent education regarding Trigger Point Dry Needling: Subsequent Did patient give consent to treatment with Trigger Point Dry Needling: Yes TPDN with skilled palpation and monitoring followed by   STM to the following muscles: Lt upper trap  Manual: Supine first rib depression mob grade III STM L UT  Shoulder rolls with 3lb DB 20x Scap elevation and slow eccentric lower UT trap stretch with depression  9/27:  Trigger Point Dry Needling, Manual Therapy Treatment:  Initial or subsequent education regarding Trigger Point Dry Needling: Subsequent Did patient give consent to treatment with Trigger Point Dry Needling: Yes TPDN with skilled palpation and monitoring followed by STM to the following muscles: Lt upper trap, Lt levator scapula Prone rib and thoracic spine mobility, Lt first rib depression/ER    Seated cervical rotation- in chin tuck/open facets Upper trap stretch, levator stretch 1/2 foam roll with head turns  9/11   Trigger Point Dry-Needling  Treatment instructions: Expect mild to moderate muscle soreness. S/S of pneumothorax if dry needled  over a lung field, and to seek immediate medical attention should they occur. Patient verbalized understanding of these instructions and education.   Patient Consent Given: Yes Education (verbally/handout)provided: Yes Muscles treated: bilat L UT, bilat C3-5 C/S multifidi Electrical stimulation performed: No Treatment response/outcome: several strong LTR elicited  STM L C/S paraspinals, L UT Grade IV C3-6 sideglide; Prone UPA and CPA grade III-IV C3-6  C/S SNAG rotional 10x each way Bilat RTB ER 2x10  Edu provided about exercise management 9/6  Trigger Point Dry-Needling  Treatment instructions: Expect mild to moderate muscle soreness. S/S of pneumothorax if dry needled over a lung field, and to seek immediate medical attention should they occur. Patient verbalized understanding of these instructions and education.   Patient Consent Given: Yes Education (verbally/handout)provided: Yes Muscles treated: bilat C/S C3-5 paraspinals, bilat C3-5 C/S multifidi Electrical stimulation performed: No Treatment response/outcome: several strong LTR elicited  STM L C/S paraspinals, L UT Grade III C3-6 sideglide; UPA and CPA grade III C3-6 Manual traction 5 min  Median n. 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 Pt with recurring UT pain with current exercise techniques so pivoted to activation and conscious relaxation  of the L UT today. Pt report high focus on depression and trying to prevent elevation so pt encouraged to do active light resisted elevation with conscious relaxation and elongation in hopes of retraining L UT from hypertonus state. Pt did have improve soft tissue extensbility following TPDN today. Plan to revisit TPDN, joint mobs, and active trap activation and relaxation at next. Pt would benefit from continued skilled therapy in order to  reach goals and maximize functional C/S strength and ROM for progression to pain free ADL and goals.    OBJECTIVE IMPAIRMENTS decreased ROM, decreased strength, hypomobility, increased fascial restrictions, increased muscle spasms, impaired flexibility, improper body mechanics, postural dysfunction, and pain.    ACTIVITY LIMITATIONS carrying, lifting, sitting   PARTICIPATION LIMITATIONS: driving, shopping, community activity, occupation, and yard work   PERSONAL FACTORS Age, Time since onset of injury/illness/exacerbation, and 1 comorbidity:    are also affecting patient's functional outcome.    REHAB POTENTIAL: Good   CLINICAL DECISION MAKING: Stable/uncomplicated   EVALUATION COMPLEXITY: Low     GOALS:   SHORT TERM GOALS: Target date: 05/02/2022    Pt will become independent with HEP in order to demonstrate synthesis of PT education.  Goal status: MET   2.  Pt will report at least 2 pt reduction on NPRS scale for pain in order to demonstrate functional improvement with household activity, self care, and ADL.    Goal status: MET   3.  Pt will be able to report abolishment of HA in order to demonstrate functional improvement in C/S function for self-care and house hold duties.    Goal status: MET       LONG TERM GOALS: Target date: 06/19/2022    Pt  will become independent with final HEP in order to demonstrate synthesis of PT education.    Goal status: ongoing   2.  Pt will be able to demonstrate full C/S ROM without pain in order to  demonstrate functional improvement in C/s function for return to PLOF.   Goal status: ongoing   3.  Pt will be able to demonstrate/report ability to sit/stand/sleep for extended periods of time without pain in order to demonstrate functional improvement and tolerance to static positioning.  Goal status: ongoing  4.  Pt will score >/= 66 on FOTO to demonstrate improvement in perceived C/S function.  Baseline:  Goal status: ongoing  PLAN: PT FREQUENCY: 1-2x/week   PT DURATION: 12 weeks (likely every other week, likely D/C in 8 wks)   PLANNED INTERVENTIONS: Therapeutic exercises, Therapeutic activity, Neuromuscular re-education, Balance training, Gait training, Patient/Family education, Self Care, Joint mobilization, Joint manipulation, Orthotic/Fit training, Aquatic Therapy, Dry Needling, Electrical stimulation, Spinal manipulation, Spinal mobilization, Cryotherapy, Moist heat, Compression bandaging, scar mobilization, Splintting, Taping, Vasopneumatic device, Traction, Ultrasound, Ionotophoresis 51m/ml Dexamethasone, Manual therapy, and Re-evaluation   PLAN FOR NEXT SESSION: DN PRN, side glide joint mobs, rotational joint mobs, mob with movement   ADaleen BoPT, DPT 05/08/22 8:48 AM

## 2022-05-09 DIAGNOSIS — H5203 Hypermetropia, bilateral: Secondary | ICD-10-CM | POA: Diagnosis not present

## 2022-05-09 DIAGNOSIS — H43813 Vitreous degeneration, bilateral: Secondary | ICD-10-CM | POA: Diagnosis not present

## 2022-05-09 DIAGNOSIS — H524 Presbyopia: Secondary | ICD-10-CM | POA: Diagnosis not present

## 2022-05-09 DIAGNOSIS — H2513 Age-related nuclear cataract, bilateral: Secondary | ICD-10-CM | POA: Diagnosis not present

## 2022-05-09 DIAGNOSIS — H52203 Unspecified astigmatism, bilateral: Secondary | ICD-10-CM | POA: Diagnosis not present

## 2022-05-14 ENCOUNTER — Ambulatory Visit (HOSPITAL_BASED_OUTPATIENT_CLINIC_OR_DEPARTMENT_OTHER): Payer: PPO | Admitting: Physical Therapy

## 2022-05-14 ENCOUNTER — Encounter (HOSPITAL_BASED_OUTPATIENT_CLINIC_OR_DEPARTMENT_OTHER): Payer: Self-pay | Admitting: Physical Therapy

## 2022-05-14 DIAGNOSIS — M542 Cervicalgia: Secondary | ICD-10-CM | POA: Diagnosis not present

## 2022-05-14 NOTE — Therapy (Signed)
OUTPATIENT PHYSICAL THERAPY CERVICAL TREATMENT   Patient Name: Ashley Blair MRN: [id number] DOB:1955/12/03, 66 y.o., female Today's Date: 05/14/2022   PT End of Session - 05/14/22 1151     Visit Number 8    Number of Visits 13    Date for PT Re-Evaluation 06/02/22    Authorization Type HTA    PT Start Time 1148    PT Stop Time 1220    PT Time Calculation (min) 32 min    Activity Tolerance Patient tolerated treatment well;Patient limited by pain    Behavior During Therapy [facility] for tasks assessed/performed                 Past Medical History:  Diagnosis Date   Elevated hemoglobin A1c 2019   5.7   Family history of DES exposure    Fibrocystic breast    GERD (gastroesophageal reflux disease)    HPV (human papilloma virus) infection    Hypercholesterolemia    OCD (obsessive compulsive disorder)    S/P excision of lipoma    Past Surgical History:  Procedure Laterality Date   ANKLE SURGERY     broken as a child   DILATION AND CURETTAGE OF UTERUS     LIPOMA EXCISION     rib cage   Patient Active Problem List   Diagnosis Date Noted   Family history of breast cancer in mother 12/12/2013   H/O diethylstilbestrol (DES) exposure in utero 12/12/2013    PCP:  Aretta Nip, MD     REFERRING PROVIDER: Simona Huh, NP  REFERRING DIAG: M54.2 (ICD-10-CM) - Cervicalgia  THERAPY DIAG:  Cervicalgia  Rationale for Evaluation and Treatment Rehabilitation  ONSET DATE: 01/2022  SUBJECTIVE:                                                                                                                                                                                                         SUBJECTIVE STATEMENT: Pt states the L side is still tight and painful. About 25% better than last time. She states it is still very painful on the L with rotating and coming back from the R. It is grabbing less today after the DN and manual from last time.  PERTINENT  HISTORY:  N/A  PAIN:  Are you having pain? Yes: NPRS scale: 2/10 Pain location: L lateral side of C2-3 region Pain description: aching, cramping, dull, sharp, spasm Aggravating factors: movement Relieving factors: resting PRECAUTIONS: None  WEIGHT BEARING RESTRICTIONS No  FALLS:  Has patient fallen in last 6 months? 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 Mild spinal canal stenosis at C3-4, C4-5, and C6-7. Multilevel neural foraminal narrowing, as described above.    PATIENT SURVEYS:  FOTO 63 66 @ DC   TODAY'S TREATMENT:   10/11:  Trigger Point Dry Needling, Manual Therapy Treatment:  Initial or subsequent education regarding Trigger Point Dry Needling: Subsequent Did patient give consent to treatment with Trigger Point Dry Needling: Yes TPDN with skilled palpation and monitoring followed by   STM to the following muscles: Lt upper trap  Manual: Supine first rib depression mob grade III Grade III C3-6 side glide STM L UT  10/5:  Trigger Point Dry Needling, Manual Therapy Treatment:  Initial or subsequent education regarding Trigger Point Dry Needling: Subsequent Did patient give consent to treatment with Trigger Point Dry Needling: Yes TPDN with skilled palpation and monitoring followed by   STM to the following muscles: Lt upper trap  Manual: Supine first rib depression mob grade III STM L UT  Shoulder rolls with 3lb DB 20x Scap elevation and slow eccentric lower UT trap stretch with depression  9/27:  Trigger Point Dry Needling, Manual Therapy Treatment:  Initial or subsequent education regarding  Trigger Point Dry Needling: Subsequent Did patient give consent to treatment with Trigger Point Dry Needling: Yes TPDN with skilled palpation and monitoring followed by STM to the following muscles: Lt upper trap, Lt levator scapula Prone rib and thoracic spine mobility, Lt first rib depression/ER    Seated cervical rotation- in chin tuck/open facets Upper trap stretch, levator stretch 1/2 foam roll with head turns   PATIENT EDUCATION:  Education details: Geophysicist/field seismologist of condition Person educated: Patient Education method: Consulting civil engineer, Demonstration, Tactile cues, Verbal cues, and Handouts Education comprehension: verbalized understanding, returned demonstration, verbal cues required, tactile cues required, and needs further education     HOME EXERCISE PROGRAM:  Access Code: [email or web address] URL: [email or web address]  ASSESSMENT:   CLINICAL IMPRESSION: Pt with improvement in L SB and rotation ROM today following DN and joint mobilizations. PT noted today that R lateral flexion stiffness may be limiting L rotation and SB type motions. Pt to continue with active contraction and relaxation exercise at next session. DN PRN. Several large twitch responses elicited today with decrease in tissue tension. If pain has improved enough to progress exercise, plan to progress postural strength. Pt would benefit from continued skilled therapy in order to reach goals and maximize functional C/S strength and ROM for progression to pain free ADL and goals.    OBJECTIVE IMPAIRMENTS decreased ROM, decreased strength, hypomobility, increased fascial restrictions, increased muscle spasms, impaired flexibility, improper body mechanics, postural dysfunction, and pain.    ACTIVITY LIMITATIONS carrying, lifting, sitting   PARTICIPATION LIMITATIONS: driving, shopping, community activity, occupation, and yard work   PERSONAL FACTORS Age, Time since onset of injury/illness/exacerbation, and 1 comorbidity:     are also affecting patient's functional outcome.    REHAB POTENTIAL: Good   CLINICAL DECISION MAKING: Stable/uncomplicated   EVALUATION COMPLEXITY: Low     GOALS:   SHORT TERM GOALS: Target date: 05/02/2022    Pt will become independent with HEP in order to demonstrate synthesis of PT education.  Goal status: MET  2.  Pt will report at least 2 pt reduction on NPRS scale for pain in order to demonstrate functional improvement with household activity, self care, and ADL.    Goal status: MET   3.  Pt will be able to report abolishment of HA in order to demonstrate functional improvement in C/S function for self-care and house hold duties.    Goal status: MET       LONG TERM GOALS: Target date: 06/19/2022    Pt  will become independent with final HEP in order to demonstrate synthesis of PT education.    Goal status: ongoing   2.  Pt will be able to demonstrate full C/S ROM without pain in order to demonstrate functional improvement in C/s function for return to PLOF.   Goal status: ongoing   3.  Pt will be able to demonstrate/report ability to sit/stand/sleep for extended periods of time without pain in order to demonstrate functional improvement and tolerance to static positioning.  Goal status: ongoing  4.  Pt will score >/= 66 on FOTO to demonstrate improvement in perceived C/S function.  Baseline:  Goal status: ongoing  PLAN: PT FREQUENCY: 1-2x/week   PT DURATION: 12 weeks (likely every other week, likely D/C in 8 wks)   PLANNED INTERVENTIONS: Therapeutic exercises, Therapeutic activity, Neuromuscular re-education, Balance training, Gait training, Patient/Family education, Self Care, Joint mobilization, Joint manipulation, Orthotic/Fit training, Aquatic Therapy, Dry Needling, Electrical stimulation, Spinal manipulation, Spinal mobilization, Cryotherapy, Moist heat, Compression bandaging, scar mobilization, Splintting, Taping, Vasopneumatic device, Traction,  Ultrasound, Ionotophoresis 82m/ml Dexamethasone, Manual therapy, and Re-evaluation   PLAN FOR NEXT SESSION: DN PRN, side glide joint mobs, rotational joint mobs, mob with movement   ADaleen BoPT, DPT 05/14/22 12:42 PM

## 2022-05-16 DIAGNOSIS — M25562 Pain in left knee: Secondary | ICD-10-CM | POA: Diagnosis not present

## 2022-05-16 DIAGNOSIS — M1712 Unilateral primary osteoarthritis, left knee: Secondary | ICD-10-CM | POA: Diagnosis not present

## 2022-05-21 ENCOUNTER — Encounter (HOSPITAL_BASED_OUTPATIENT_CLINIC_OR_DEPARTMENT_OTHER): Payer: Self-pay | Admitting: Physical Therapy

## 2022-05-21 ENCOUNTER — Ambulatory Visit (HOSPITAL_BASED_OUTPATIENT_CLINIC_OR_DEPARTMENT_OTHER): Payer: PPO | Admitting: Physical Therapy

## 2022-05-21 DIAGNOSIS — M542 Cervicalgia: Secondary | ICD-10-CM

## 2022-05-21 NOTE — Therapy (Signed)
OUTPATIENT PHYSICAL THERAPY CERVICAL TREATMENT   Patient Name: Ashley Blair MRN: [id number] DOB:[birth year], 66 y.o., female Today's Date: 05/21/2022   PT End of Session - 05/21/22 1239     Visit Number 9    Number of Visits 13    Date for PT Re-Evaluation 06/02/22    Authorization Type HTA    PT Start Time 1150    PT Stop Time 1230    PT Time Calculation (min) 40 min    Activity Tolerance Patient tolerated treatment well;Patient limited by pain    Behavior During Therapy [facility] for tasks assessed/performed                  Past Medical History:  Diagnosis Date   Elevated hemoglobin A1c 2019   5.7   Family history of DES exposure    Fibrocystic breast    GERD (gastroesophageal reflux disease)    HPV (human papilloma virus) infection    Hypercholesterolemia    OCD (obsessive compulsive disorder)    S/P excision of lipoma    Past Surgical History:  Procedure Laterality Date   ANKLE SURGERY     broken as a child   DILATION AND CURETTAGE OF UTERUS     LIPOMA EXCISION     rib cage   Patient Active Problem List   Diagnosis Date Noted   Family history of breast cancer in mother 12/12/2013   H/O diethylstilbestrol (DES) exposure in utero 12/12/2013    PCP:  Aretta Nip, MD     REFERRING PROVIDER: Simona Huh, NP  REFERRING DIAG: M54.2 (ICD-10-CM) - Cervicalgia  THERAPY DIAG:  Cervicalgia  Rationale for Evaluation and Treatment Rehabilitation  ONSET DATE: 01/2022  SUBJECTIVE:                                                                                                                                                                                                         SUBJECTIVE STATEMENT: Pt states the L side is still tight and painful.Pt is better than last time. She feels the R side is extremely tight.   PERTINENT HISTORY:  N/A  PAIN:  Are you having pain? Yes: NPRS scale: 2-3/10 Pain location: L lateral side of C2-3  region Pain description: aching, cramping, dull, sharp, spasm Aggravating factors: movement Relieving factors: resting PRECAUTIONS: None  WEIGHT BEARING RESTRICTIONS No  FALLS:  Has patient fallen in last 6 months? No  LIVING ENVIRONMENT: Lives with: lives with their family  OCCUPATION: former fitness instructor  PLOF: Independent  PATIENT GOALS : reduce pain  OBJECTIVE:   DIAGNOSTIC FINDINGS:  MRI Impression: from [facility] radiology Degenerative changes of the cervical spine, more pronounced C5-6 where there is moderate spinal canal stenosis with mass effect on the cord without cord signal abnormality. Mild spinal canal stenosis at C3-4, C4-5, and C6-7. Multilevel neural foraminal narrowing, as described above.    PATIENT SURVEYS:  FOTO 63 66 @ DC   TODAY'S TREATMENT:   10/18:  Trigger Point Dry Needling, Manual Therapy Treatment:  Initial or subsequent education regarding Trigger Point Dry Needling: Subsequent Did patient give consent to treatment with Trigger Point Dry Needling: Yes TPDN with skilled palpation and monitoring followed by  STM to the following muscles: L upper trap, bilat C3-5 multifidi  Manual: Prone UPA Grade III C3-6  STM L UT and bilat C/S paraspinals  Exercises - Quadruped Cervical Retraction  - 1 x daily - 7 x weekly - 2 sets - 10 reps - Gentle Levator Scapulae Stretch  - 2 x daily - 7 x weekly - 1 sets - 3 reps - 30 hold - Shoulder External Rotation and Scapular Retraction with Resistance  - 1 x daily - 7 x weekly - 2 sets - 10 reps - Supine Cervical Rotation PROM  - 3 x daily - 7 x weekly - 1 sets - 5 reps - 1 breath hold - Standing Shoulder Row with Anchored Resistance  - 1 x daily - 7 x weekly - 3 sets - 10 reps - Wall Angels  - 1-2 x daily - 7 x weekly - 1 sets - 10 reps  10/11:  Trigger Point Dry Needling, Manual Therapy Treatment:  Initial or subsequent education regarding Trigger Point Dry Needling: Subsequent Did patient give  consent to treatment with Trigger Point Dry Needling: Yes TPDN with skilled palpation and monitoring followed by   STM to the following muscles: Lt upper trap  Manual: Supine first rib depression mob grade III Grade III C3-6 side glide STM L UT   PATIENT EDUCATION:  Education details:  anatomy, exercise progression, DOMS expectations, muscle firing, HEP, POC Person educated: Patient Education method: Explanation, Demonstration, Tactile cues, Verbal cues, and Handouts Education comprehension: verbalized understanding, returned demonstration, verbal cues required, tactile cues required, and needs further education     HOME EXERCISE PROGRAM:  Access Code: [email or web address] URL: [email or web address]  ASSESSMENT:   CLINICAL IMPRESSION: Pt able to progress functional postural mobility exercise and deep cervical neck flexor activation today following TPDN and manual therapy. Pt reported improvement in C/S ROM following manual therapy and was able to follow up with T/S mobility, pec flexibility, and scapular retraction strengthening. Plan for PN at next session to assess POC and continuation of therapy. Pt has improved with skilled therapy intervention but is likely beginning to reach functional plateau, if no significant changes with exercise modifications. Pt would benefit from continued skilled therapy in order to reach goals and maximize functional C/S strength and ROM for progression to pain free ADL and goals.    OBJECTIVE IMPAIRMENTS decreased ROM, decreased strength, hypomobility, increased fascial restrictions, increased muscle spasms, impaired flexibility, improper body mechanics, postural dysfunction, and pain.    ACTIVITY LIMITATIONS carrying, lifting, sitting   PARTICIPATION LIMITATIONS: driving, shopping, community activity, occupation, and yard work   PERSONAL FACTORS Age, Time since onset of injury/illness/exacerbation, and 1 comorbidity:    are also affecting patient's  functional outcome.    REHAB POTENTIAL: Good   CLINICAL DECISION MAKING: Stable/uncomplicated   EVALUATION COMPLEXITY: Low     GOALS:  SHORT TERM GOALS: Target date: 05/02/2022    Pt will become independent with HEP in order to demonstrate synthesis of PT education.  Goal status: MET   2.  Pt will report at least 2 pt reduction on NPRS scale for pain in order to demonstrate functional improvement with household activity, self care, and ADL.    Goal status: MET   3.  Pt will be able to report abolishment of HA in order to demonstrate functional improvement in C/S function for self-care and house hold duties.    Goal status: MET       LONG TERM GOALS: Target date: 06/19/2022    Pt  will become independent with final HEP in order to demonstrate synthesis of PT education.    Goal status: ongoing   2.  Pt will be able to demonstrate full C/S ROM without pain in order to demonstrate functional improvement in C/s function for return to PLOF.   Goal status: ongoing   3.  Pt will be able to demonstrate/report ability to sit/stand/sleep for extended periods of time without pain in order to demonstrate functional improvement and tolerance to static positioning.  Goal status: ongoing  4.  Pt will score >/= 66 on FOTO to demonstrate improvement in perceived C/S function.  Baseline:  Goal status: ongoing  PLAN: PT FREQUENCY: 1-2x/week   PT DURATION: 12 weeks (likely every other week, likely D/C in 8 wks)   PLANNED INTERVENTIONS: Therapeutic exercises, Therapeutic activity, Neuromuscular re-education, Balance training, Gait training, Patient/Family education, Self Care, Joint mobilization, Joint manipulation, Orthotic/Fit training, Aquatic Therapy, Dry Needling, Electrical stimulation, Spinal manipulation, Spinal mobilization, Cryotherapy, Moist heat, Compression bandaging, scar mobilization, Splintting, Taping, Vasopneumatic device, Traction, Ultrasound, Ionotophoresis 51m/ml  Dexamethasone, Manual therapy, and Re-evaluation   PLAN FOR NEXT SESSION: DN PRN, side glide joint mobs, rotational joint mobs, mob with movement, FOTO and PN   ADaleen BoPT, DPT 05/21/22 12:46 PM

## 2022-05-28 ENCOUNTER — Ambulatory Visit (HOSPITAL_BASED_OUTPATIENT_CLINIC_OR_DEPARTMENT_OTHER): Payer: PPO | Admitting: Physical Therapy

## 2022-05-28 ENCOUNTER — Encounter (HOSPITAL_BASED_OUTPATIENT_CLINIC_OR_DEPARTMENT_OTHER): Payer: Self-pay | Admitting: Physical Therapy

## 2022-05-28 DIAGNOSIS — M542 Cervicalgia: Secondary | ICD-10-CM | POA: Diagnosis not present

## 2022-05-28 DIAGNOSIS — G8929 Other chronic pain: Secondary | ICD-10-CM

## 2022-05-28 NOTE — Therapy (Signed)
OUTPATIENT PHYSICAL THERAPY CERVICAL TREATMENT   Patient Name: Ashley Blair MRN: [id number] DOB:02-02-1956, 66 y.o., female Today's Date: 05/28/2022   PT End of Session - 05/28/22 1226     Visit Number 10    Number of Visits 18    Date for PT Re-Evaluation 07/23/22    Authorization Type HTA    PT Start Time 1145    PT Stop Time 1227    PT Time Calculation (min) 42 min    Activity Tolerance Patient tolerated treatment well;Patient limited by pain    Behavior During Therapy [facility] for tasks assessed/performed                   Past Medical History:  Diagnosis Date   Elevated hemoglobin A1c 2019   5.7   Family history of DES exposure    Fibrocystic breast    GERD (gastroesophageal reflux disease)    HPV (human papilloma virus) infection    Hypercholesterolemia    OCD (obsessive compulsive disorder)    S/P excision of lipoma    Past Surgical History:  Procedure Laterality Date   ANKLE SURGERY     broken as a child   DILATION AND CURETTAGE OF UTERUS     LIPOMA EXCISION     rib cage   Patient Active Problem List   Diagnosis Date Noted   Family history of breast cancer in mother 12/12/2013   H/O diethylstilbestrol (DES) exposure in utero 12/12/2013    PCP:  Aretta Nip, MD     REFERRING PROVIDER: Simona Huh, NP Gentry Fitz, MD  REFERRING DIAG: M54.2 (ICD-10-CM) - Cervicalgia    Bilateral knee osteoarthritis  THERAPY DIAG:  Cervicalgia  Chronic pain of left knee  Chronic pain of right knee  Rationale for Evaluation and Treatment Rehabilitation  ONSET DATE: 01/2022  SUBJECTIVE:                                                                                                                                                                                                        Progress Note Reporting Period 03/04/22 to 05/28/2022   See note below for Objective Data and Assessment of Progress/Goals.     SUBJECTIVE  STATEMENT: Neck feels 95% better. ROM is so much better. Still have a really tight/tender spot here (pointing in left upper trap)  4 years since left knee, 2 yr since right knee. Pain is behind knee. Had gel injections in right knee which helped. Steroid injections in left knee did not work. Over this last weekend knee got  really bad- anything that bends my knee to the extreme hurts. Walked 3 miles instead of 4 or 5.  Taking naprosyn daily, meloxicam did not help.   PERTINENT HISTORY:  N/A  PAIN:  Are you having pain? Yes: NPRS scale: 2-3/10 Pain location: L upper trap tightness Pain description: aching, cramping, dull, sharp, spasm Aggravating factors: constant Relieving factors: resting head  PAIN:  Are you having pain? Yes: NPRS scale: severe/10 Pain location: post left knee Pain description: pinching, sharp Aggravating factors: knee flexion Relieving factors: keeping it straighter  PRECAUTIONS: None  WEIGHT BEARING RESTRICTIONS No  FALLS:  Has patient fallen in last 6 months? 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 Mild spinal canal stenosis at C3-4, C4-5, and C6-7. Multilevel neural foraminal narrowing, as described above.    PATIENT SURVEYS:  FOTO 63 66 @ DC  MMT lb Right 10/25 Left 10/25  Hip abduction 39.2 41.4  Knee flexion 32.6 17.9  Knee extension 40.1 34.4   (Blank rows = not tested)   TODAY'S TREATMENT:   Treatment                            05/28/22:  Prone passive quad stretch Trigger Point Dry Needling, Manual Therapy Treatment:  Initial or subsequent education regarding Trigger Point Dry  Needling: Subsequent Did patient give consent to treatment with Trigger Point Dry Needling: Yes TPDN with skilled palpation and monitoring followed by STM to the following muscles: Lt upper trap  Glut use in sit<>stand Stair training- gluts for half way down and then fall forward    10/18:  Trigger Point Dry Needling, Manual Therapy Treatment:  Initial or subsequent education regarding Trigger Point Dry Needling: Subsequent Did patient give consent to treatment with Trigger Point Dry Needling: Yes TPDN with skilled palpation and monitoring followed by  STM to the following muscles: L upper trap, bilat C3-5 multifidi  Manual: Prone UPA Grade III C3-6  STM L UT and bilat C/S paraspinals  Exercises - Quadruped Cervical Retraction  - 1 x daily - 7 x weekly - 2 sets - 10 reps - Gentle Levator Scapulae Stretch  - 2 x daily - 7 x weekly - 1 sets - 3 reps - 30 hold - Shoulder External Rotation and Scapular Retraction with Resistance  - 1 x daily - 7 x weekly - 2 sets - 10 reps - Supine Cervical Rotation PROM  - 3 x daily - 7 x weekly - 1 sets - 5 reps - 1 breath hold - Standing Shoulder Row with Anchored Resistance  - 1 x daily - 7 x weekly - 3 sets - 10 reps - Wall Angels  - 1-2 x daily - 7 x weekly - 1 sets - 10 reps  10/11:  Trigger Point Dry Needling, Manual Therapy Treatment:  Initial or subsequent education regarding Trigger Point Dry Needling: Subsequent Did patient give consent to treatment with Trigger Point Dry Needling: Yes TPDN with skilled palpation and monitoring followed by   STM to the following muscles: Lt upper trap  Manual: Supine first rib depression mob grade III Grade III C3-6 side glide STM  L UT   PATIENT EDUCATION:  Education details:  anatomy, exercise progression, DOMS expectations, muscle firing, HEP, POC Person educated: Patient Education method: Explanation, Demonstration, Tactile cues, Verbal cues, and Handouts Education comprehension: verbalized  understanding, returned demonstration, verbal cues required, tactile cues required, and needs further education     HOME EXERCISE PROGRAM:  Access Code: [email or web address] URL: [email or web address]  ASSESSMENT:   CLINICAL IMPRESSION: Pt reports significant improvement in cervical pain and function since beginning PT. As this time there is a new referral to add treatment of bil knees as well. Noted that Lt quads have significantly less passive flexibility in prone vs Rt and strength is limited as outlined in objective section. Pt is very active and I asked her to continue what she is doing to tolerance but change thinking a little- I.e using gluts for standing/walking, altered stepping pattern in descending stairs. Pt will benefit from extension of POC to treat knee pain.    OBJECTIVE IMPAIRMENTS decreased ROM, decreased strength, hypomobility, increased fascial restrictions, increased muscle spasms, impaired flexibility, improper body mechanics, postural dysfunction, and pain.    ACTIVITY LIMITATIONS carrying, lifting, sitting, stairs   PARTICIPATION LIMITATIONS: driving, shopping, community activity, occupation, and yard work   PERSONAL FACTORS Age, Time since onset of injury/illness/exacerbation, and 1 comorbidity:    are also affecting patient's functional outcome.        GOALS:   SHORT TERM GOALS: Target date: 05/02/2022    Pt will become independent with HEP in order to demonstrate synthesis of PT education.  Goal status: MET   2.  Pt will report at least 2 pt reduction on NPRS scale for pain in order to demonstrate functional improvement with household activity, self care, and ADL.    Goal status: MET   3.  Pt will be able to report abolishment of HA in order to demonstrate functional improvement in C/S function for self-care and house hold duties.    Goal status: MET       LONG TERM GOALS: Target date: 06/19/2022    Pt  will become independent with final HEP in  order to demonstrate synthesis of PT education.    Goal status: ongoing   2.  Pt will be able to demonstrate full C/S ROM without pain in order to demonstrate functional improvement in C/s function for return to PLOF.   Goal status: ongoing   3.  Pt will be able to demonstrate/report ability to sit/stand/sleep for extended periods of time without pain in order to demonstrate functional improvement and tolerance to static positioning.  Goal status: achieved regarding cervical pain  4.  Pt will score >/= 66 on FOTO to demonstrate improvement in perceived C/S function.  Baseline:  Goal status: ongoing  5.  Able to descend stairs without limitation by knee pain Baseline:  Goal status: INITIAL  6.  Demo LE strength 95% of opposite side Baseline:  Goal status: INITIAL    PLAN: PT FREQUENCY: 1-2x/week   PT DURATION: 12 weeks (likely every other week, likely D/C in 8 wks)   PLANNED INTERVENTIONS: Therapeutic exercises, Therapeutic activity, Neuromuscular re-education, Balance training, Gait training, Patient/Family education, Self Care, Joint mobilization, Joint manipulation, Orthotic/Fit training, Aquatic Therapy, Dry Needling, Electrical stimulation, Spinal manipulation, Spinal mobilization, Cryotherapy, Moist heat, Compression bandaging, scar mobilization, Splintting, Taping, Vasopneumatic device, Traction, Ultrasound, Ionotophoresis 3m/ml Dexamethasone, Manual therapy, and Re-evaluation   PLAN FOR NEXT SESSION: DN Left VL   Hillary Struss C. Simrin Vegh PT, DPT 05/28/22 2:05 PM

## 2022-06-04 ENCOUNTER — Encounter (HOSPITAL_BASED_OUTPATIENT_CLINIC_OR_DEPARTMENT_OTHER): Payer: Self-pay | Admitting: Physical Therapy

## 2022-06-04 ENCOUNTER — Ambulatory Visit (HOSPITAL_BASED_OUTPATIENT_CLINIC_OR_DEPARTMENT_OTHER): Payer: PPO | Attending: Family Medicine | Admitting: Physical Therapy

## 2022-06-04 DIAGNOSIS — G8929 Other chronic pain: Secondary | ICD-10-CM | POA: Diagnosis not present

## 2022-06-04 DIAGNOSIS — M25561 Pain in right knee: Secondary | ICD-10-CM | POA: Diagnosis not present

## 2022-06-04 DIAGNOSIS — M25562 Pain in left knee: Secondary | ICD-10-CM | POA: Insufficient documentation

## 2022-06-04 DIAGNOSIS — M542 Cervicalgia: Secondary | ICD-10-CM | POA: Insufficient documentation

## 2022-06-04 NOTE — Therapy (Signed)
OUTPATIENT PHYSICAL THERAPY CERVICAL TREATMENT   Patient Name: Ashley Blair MRN: [id number] DOB:[birth year], 66 y.o., female Today's Date: 06/04/2022   PT End of Session - 06/04/22 1052     Visit Number 11    Number of Visits 18    Date for PT Re-Evaluation 07/23/22    Authorization Type HTA    PT Start Time 1055    PT Stop Time 1135    PT Time Calculation (min) 40 min    Activity Tolerance Patient tolerated treatment well;Patient limited by pain    Behavior During Therapy [facility] for tasks assessed/performed                   Past Medical History:  Diagnosis Date   Elevated hemoglobin A1c 2019   5.7   Family history of DES exposure    Fibrocystic breast    GERD (gastroesophageal reflux disease)    HPV (human papilloma virus) infection    Hypercholesterolemia    OCD (obsessive compulsive disorder)    S/P excision of lipoma    Past Surgical History:  Procedure Laterality Date   ANKLE SURGERY     broken as a child   DILATION AND CURETTAGE OF UTERUS     LIPOMA EXCISION     rib cage   Patient Active Problem List   Diagnosis Date Noted   Family history of breast cancer in mother 12/12/2013   H/O diethylstilbestrol (DES) exposure in utero 12/12/2013    PCP:  Rankins, Bill Salinas, MD     REFERRING PROVIDER: Simona Huh, NP Gentry Fitz, MD  REFERRING DIAG: M54.2 (ICD-10-CM) - Cervicalgia    Bilateral knee osteoarthritis  THERAPY DIAG:  Cervicalgia  Chronic pain of left knee  Chronic pain of right knee  Rationale for Evaluation and Treatment Rehabilitation  ONSET DATE: 01/2022  SUBJECTIVE:                                                 SUBJECTIVE STATEMENT:  Pt states the knee is swollen. She is planning to have the gel injections. Pt states she has history of MRI showing progressive arthritis and "wearing away of meniscus."   Eval:  Neck feels 95% better. ROM is so much better. Still have a really tight/tender spot here (pointing  in left upper trap)  4 years since left knee, 2 yr since right knee. Pain is behind knee. Had gel injections in right knee which helped. Steroid injections in left knee did not work. Over this last weekend knee got really bad- anything that bends my knee to the extreme hurts. Walked 3 miles instead of 4 or 5.  Taking naprosyn daily, meloxicam did not help.   PERTINENT HISTORY:  N/A  PAIN:  Are you having pain? Yes: NPRS scale: 1/10; 5/10 at knee Pain location: L upper trap tightness Pain description: aching, cramping, dull, sharp, spasm Aggravating factors: constant Relieving factors: resting head  PAIN:  Are you having pain? Yes: NPRS scale: severe/10 Pain location: post left knee Pain description: pinching, sharp Aggravating factors: knee flexion Relieving factors: keeping it straighter  PRECAUTIONS: None  WEIGHT BEARING RESTRICTIONS No  FALLS:  Has patient fallen in last 6 months? No  LIVING ENVIRONMENT: Lives with: lives with their family  OCCUPATION: former fitness instructor  PLOF: Independent  PATIENT GOALS : reduce pain  OBJECTIVE:  DIAGNOSTIC FINDINGS:  MRI Impression: from [facility] radiology Degenerative changes of the cervical spine, more pronounced C5-6 where there is moderate spinal canal stenosis with mass effect on the cord without cord signal abnormality. Mild spinal canal stenosis at C3-4, C4-5, and C6-7. Multilevel neural foraminal narrowing, as described above.    PATIENT SURVEYS:  FOTO 63 66 @ DC  MMT lb Right 10/25 Left 10/25  Hip abduction 39.2 41.4  Knee flexion 32.6 17.9  Knee extension 40.1 34.4   (Blank rows = not tested)   TODAY'S TREATMENT:   Treatment    11/1  STM L rec fem and VL L knee flexion grade IV mob, tibial posterior glide  Stair management for pain avoidance: hip strategy does not change pain, advised to do step to until L quad eccentric strength improves  Exercises - Single Leg Squat with Chair Touch  - 1 x  daily - 3 x weekly - 4 sets - 6 reps - Wall Sit  - 1 x daily - 3 x weekly - 1 sets - 3 reps - 20 hold - Sitting Knee Extension with Resistance  - 1 x daily - 3 x weekly - 3 sets - 10 reps - Quadriceps Mobilization with Foam Roll  - 1 x daily - 7 x weekly - 1 sets - 1 reps - 2-5 hold  05/28/22:  Prone passive quad stretch Trigger Point Dry Needling, Manual Therapy Treatment:  Initial or subsequent education regarding Trigger Point Dry Needling: Subsequent Did patient give consent to treatment with Trigger Point Dry Needling: Yes TPDN with skilled palpation and monitoring followed by STM to the following muscles: Lt upper trap  Glut use in sit<>stand Stair training- gluts for half way down and then fall forward    10/18:  Trigger Point Dry Needling, Manual Therapy Treatment:  Initial or subsequent education regarding Trigger Point Dry Needling: Subsequent Did patient give consent to treatment with Trigger Point Dry Needling: Yes TPDN with skilled palpation and monitoring followed by  STM to the following muscles: L upper trap, bilat C3-5 multifidi  Manual: Prone UPA Grade III C3-6  STM L UT and bilat C/S paraspinals  Exercises - Quadruped Cervical Retraction  - 1 x daily - 7 x weekly - 2 sets - 10 reps - Gentle Levator Scapulae Stretch  - 2 x daily - 7 x weekly - 1 sets - 3 reps - 30 hold - Shoulder External Rotation and Scapular Retraction with Resistance  - 1 x daily - 7 x weekly - 2 sets - 10 reps - Supine Cervical Rotation PROM  - 3 x daily - 7 x weekly - 1 sets - 5 reps - 1 breath hold - Standing Shoulder Row with Anchored Resistance  - 1 x daily - 7 x weekly - 3 sets - 10 reps - Wall Angels  - 1-2 x daily - 7 x weekly - 1 sets - 10 reps  10/11:  Trigger Point Dry Needling, Manual Therapy Treatment:  Initial or subsequent education regarding Trigger Point Dry Needling: Subsequent Did patient give consent to treatment with Trigger Point Dry Needling: Yes TPDN with skilled  palpation and monitoring followed by   STM to the following muscles: Lt upper trap  Manual: Supine first rib depression mob grade III Grade III C3-6 side glide STM L UT   PATIENT EDUCATION:  Education details:  anatomy, inflammation/edema management, exercise progression, exercise modification, acceptable levels of pain, muscle firing, HEP, POC Person educated: Patient Education method: Explanation,  Demonstration, Tactile cues, Verbal cues, and Handouts Education comprehension: verbalized understanding, returned demonstration, verbal cues required, tactile cues required, and needs further education     HOME EXERCISE PROGRAM:  Access Code: [email or web address] URL: [email or web address]  ASSESSMENT:   CLINICAL IMPRESSION: Pt knee pain appears related to history of OA. Pt 's with localized swelling into the popliteal space and med and lat patellar pouches. Pt did have improved flexion with joint mobilizations and with extensive trigger points through L quad. Pt advised to use a knee sleeve with exercise, continue quad stretching, and foam rolling to improve flexion. Pt HEP provided today start working on L quad concentric and eccentric strength. Pt advised to avoid painful things such downhill walking and reciprocal descending of steps at this time given L knee deficits. Plan to continue with L knee ROM and knee and hip strength as tolerated. TPDN PRN for knee and neck. Pt would benefit from continued skilled therapy in order to reach goals and maximize functional LE strength and ROM for prolonging of future surgical intervention.   OBJECTIVE IMPAIRMENTS decreased ROM, decreased strength, hypomobility, increased fascial restrictions, increased muscle spasms, impaired flexibility, improper body mechanics, postural dysfunction, and pain.    ACTIVITY LIMITATIONS carrying, lifting, sitting, stairs   PARTICIPATION LIMITATIONS: driving, shopping, community activity, occupation, and yard  work   PERSONAL FACTORS Age, Time since onset of injury/illness/exacerbation, and 1 comorbidity:    are also affecting patient's functional outcome.        GOALS:   SHORT TERM GOALS: Target date: 05/02/2022    Pt will become independent with HEP in order to demonstrate synthesis of PT education.  Goal status: MET   2.  Pt will report at least 2 pt reduction on NPRS scale for pain in order to demonstrate functional improvement with household activity, self care, and ADL.    Goal status: MET   3.  Pt will be able to report abolishment of HA in order to demonstrate functional improvement in C/S function for self-care and house hold duties.    Goal status: MET       LONG TERM GOALS: Target date: 06/19/2022    Pt  will become independent with final HEP in order to demonstrate synthesis of PT education.    Goal status: ongoing   2.  Pt will be able to demonstrate full C/S ROM without pain in order to demonstrate functional improvement in C/s function for return to PLOF.   Goal status: ongoing   3.  Pt will be able to demonstrate/report ability to sit/stand/sleep for extended periods of time without pain in order to demonstrate functional improvement and tolerance to static positioning.  Goal status: achieved regarding cervical pain  4.  Pt will score >/= 66 on FOTO to demonstrate improvement in perceived C/S function.  Baseline:  Goal status: ongoing  5.  Able to descend stairs without limitation by knee pain Baseline:  Goal status: INITIAL  6.  Demo LE strength 95% of opposite side Baseline:  Goal status: INITIAL    PLAN: PT FREQUENCY: 1-2x/week   PT DURATION: 12 weeks (likely every other week, likely D/C in 8 wks)   PLANNED INTERVENTIONS: Therapeutic exercises, Therapeutic activity, Neuromuscular re-education, Balance training, Gait training, Patient/Family education, Self Care, Joint mobilization, Joint manipulation, Orthotic/Fit training, Aquatic Therapy, Dry  Needling, Electrical stimulation, Spinal manipulation, Spinal mobilization, Cryotherapy, Moist heat, Compression bandaging, scar mobilization, Splintting, Taping, Vasopneumatic device, Traction, Ultrasound, Ionotophoresis 25m/ml Dexamethasone, Manual therapy, and Re-evaluation   PLAN FOR NEXT SESSION:  DN Left VL   Daleen Bo PT, DPT 06/04/22 12:38 PM

## 2022-06-05 ENCOUNTER — Ambulatory Visit: Payer: PPO | Admitting: Obstetrics and Gynecology

## 2022-06-11 ENCOUNTER — Encounter (HOSPITAL_BASED_OUTPATIENT_CLINIC_OR_DEPARTMENT_OTHER): Payer: Self-pay | Admitting: Physical Therapy

## 2022-06-11 ENCOUNTER — Ambulatory Visit (HOSPITAL_BASED_OUTPATIENT_CLINIC_OR_DEPARTMENT_OTHER): Payer: PPO | Admitting: Physical Therapy

## 2022-06-11 DIAGNOSIS — M542 Cervicalgia: Secondary | ICD-10-CM

## 2022-06-11 DIAGNOSIS — G8929 Other chronic pain: Secondary | ICD-10-CM

## 2022-06-11 NOTE — Therapy (Signed)
OUTPATIENT PHYSICAL THERAPY CERVICAL TREATMENT   Patient Name: Ashley Blair MRN: [id number] DOB:[birth year], 66 y.o., female Today's Date: 06/11/2022   PT End of Session - 06/11/22 1104     Visit Number 12    Number of Visits 18    Date for PT Re-Evaluation 07/23/22    Authorization Type HTA    PT Start Time 1100    PT Stop Time 1140    PT Time Calculation (min) 40 min    Activity Tolerance Patient tolerated treatment well;Patient limited by pain    Behavior During Therapy [facility] for tasks assessed/performed                    Past Medical History:  Diagnosis Date   Elevated hemoglobin A1c 2019   5.7   Family history of DES exposure    Fibrocystic breast    GERD (gastroesophageal reflux disease)    HPV (human papilloma virus) infection    Hypercholesterolemia    OCD (obsessive compulsive disorder)    S/P excision of lipoma    Past Surgical History:  Procedure Laterality Date   ANKLE SURGERY     broken as a child   DILATION AND CURETTAGE OF UTERUS     LIPOMA EXCISION     rib cage   Patient Active Problem List   Diagnosis Date Noted   Family history of breast cancer in mother 12/12/2013   H/O diethylstilbestrol (DES) exposure in utero 12/12/2013    PCP:  Rankins, Bill Salinas, MD     REFERRING PROVIDER: Simona Huh, NP Gentry Fitz, MD  REFERRING DIAG: M54.2 (ICD-10-CM) - Cervicalgia    Bilateral knee osteoarthritis  THERAPY DIAG:  Cervicalgia  Chronic pain of left knee  Chronic pain of right knee  Rationale for Evaluation and Treatment Rehabilitation  ONSET DATE: 01/2022  SUBJECTIVE:                                                 SUBJECTIVE STATEMENT:  Pt states the L knee is painful with SL squatting when too low. The other HEP feels good. She feels that it is slightly better, especially with the foam rolling. Pt states the knee sleeve has helped swelling.   Gel shots are not scheduled as of yet.   Pt states the L side of  the neck is bothering her today as well. Along the L upper trap.   Eval:  Neck feels 95% better. ROM is so much better. Still have a really tight/tender spot here (pointing in left upper trap)  4 years since left knee, 2 yr since right knee. Pain is behind knee. Had gel injections in right knee which helped. Steroid injections in left knee did not work. Over this last weekend knee got really bad- anything that bends my knee to the extreme hurts. Walked 3 miles instead of 4 or 5.  Taking naprosyn daily, meloxicam did not help.   PERTINENT HISTORY:  N/A  PAIN:  Are you having pain? Yes: NPRS scale: 1/10; 5/10 at knee Pain location: L upper trap tightness Pain description: aching, cramping, dull, sharp, spasm Aggravating factors: constant Relieving factors: resting head  PAIN:  Are you having pain? Yes: NPRS scale: severe/10 Pain location: post left knee Pain description: pinching, sharp Aggravating factors: knee flexion Relieving factors: keeping it straighter  PRECAUTIONS: None  WEIGHT BEARING RESTRICTIONS No  FALLS:  Has patient fallen in last 6 months? 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 Mild spinal canal stenosis at C3-4, C4-5, and C6-7. Multilevel neural foraminal narrowing, as described above.    PATIENT SURVEYS:  FOTO 63 66 @ DC  MMT lb Right 10/25 Left 10/25  Hip abduction 39.2 41.4  Knee flexion 32.6 17.9  Knee extension 40.1 34.4   (Blank rows = not tested)   TODAY'S TREATMENT:   Treatment    11/8  STM L rec fem and VL; L UT L knee flexion grade IV mob, tibial posterior glide  - Sitting  Knee Extension with Resistance black TB -marching bridge 2x10 -L heel tap 4" box 10x (pain medially)  Addition of HS and glute exercise    Trigger Point Dry Needling, Manual Therapy Treatment:  Initial or subsequent education regarding Trigger Point Dry Needling: Subsequent Did patient give consent to treatment with Trigger Point Dry Needling: Yes TPDN with skilled palpation and monitoring followed by STM to the following muscles: Lt upper trap, L rec fem/vastus intermedius  11/1  STM L rec fem and VL L knee flexion grade IV mob, tibial posterior glide  Stair management for pain avoidance: hip strategy does not change pain, advised to do step to until L quad eccentric strength improves  Exercises - Single Leg Squat with Chair Touch  - 1 x daily - 3 x weekly - 4 sets - 6 reps - Wall Sit  - 1 x daily - 3 x weekly - 1 sets - 3 reps - 20 hold - Sitting Knee Extension with Resistance  - 1 x daily - 3 x weekly - 3 sets - 10 reps - Quadriceps Mobilization with Foam Roll  - 1 x daily - 7 x weekly - 1 sets - 1 reps - 2-5 hold  05/28/22:  Prone passive quad stretch Trigger Point Dry Needling, Manual Therapy Treatment:  Initial or subsequent education regarding Trigger Point Dry Needling: Subsequent Did patient give consent to treatment with Trigger Point Dry Needling: Yes TPDN with skilled palpation and monitoring followed by STM to the following muscles: Lt upper trap  Glut use in sit<>stand Stair training- gluts for half way down and then fall forward    10/18:  Trigger Point Dry Needling, Manual Therapy Treatment:  Initial or subsequent education regarding Trigger Point Dry Needling: Subsequent Did patient give consent to treatment with Trigger Point Dry Needling: Yes TPDN with skilled palpation and monitoring followed by  STM to the following muscles: L upper trap, bilat C3-5 multifidi  Manual: Prone UPA Grade III C3-6  STM L UT and bilat C/S paraspinals  Exercises -  Quadruped Cervical Retraction  - 1 x daily - 7 x weekly - 2 sets - 10 reps - Gentle Levator Scapulae Stretch  - 2 x daily - 7 x weekly - 1 sets - 3 reps - 30 hold - Shoulder External Rotation and Scapular Retraction with Resistance  - 1 x daily - 7 x weekly - 2 sets - 10 reps - Supine Cervical Rotation PROM  - 3 x daily - 7 x weekly - 1 sets - 5  reps - 1 breath hold - Standing Shoulder Row with Anchored Resistance  - 1 x daily - 7 x weekly - 3 sets - 10 reps - Wall Angels  - 1-2 x daily - 7 x weekly - 1 sets - 10 reps  10/11:  Trigger Point Dry Needling, Manual Therapy Treatment:  Initial or subsequent education regarding Trigger Point Dry Needling: Subsequent Did patient give consent to treatment with Trigger Point Dry Needling: Yes TPDN with skilled palpation and monitoring followed by   STM to the following muscles: Lt upper trap  Manual: Supine first rib depression mob grade III Grade III C3-6 side glide STM L UT   PATIENT EDUCATION:  Education details:  anatomy, inflammation/edema management, exercise progression, exercise modification, acceptable levels of pain, muscle firing, HEP, POC Person educated: Patient Education method: Explanation, Demonstration, Tactile cues, Verbal cues, and Handouts Education comprehension: verbalized understanding, returned demonstration, verbal cues required, tactile cues required, and needs further education     HOME EXERCISE PROGRAM:  Access Code: [email or web address] URL: [email or web address]  ASSESSMENT:   CLINICAL IMPRESSION: Pt with good response with knee ROM and neck pain following manual and TPDN. Pt session mainly focused on pain mangement today and improvement knee mobility. Pt did have improved knee flexion at end of session with ability to flex knee in standing. Plan to continue with pain management as tolerated and progress eccentric L quad strength as able. Pt does appear to have improved quad strength today with SL CKC  exercise. Pt would benefit from continued skilled therapy in order to reach goals and maximize functional LE strength and ROM for prolonging of future surgical intervention.   OBJECTIVE IMPAIRMENTS decreased ROM, decreased strength, hypomobility, increased fascial restrictions, increased muscle spasms, impaired flexibility, improper body mechanics, postural dysfunction, and pain.    ACTIVITY LIMITATIONS carrying, lifting, sitting, stairs   PARTICIPATION LIMITATIONS: driving, shopping, community activity, occupation, and yard work   PERSONAL FACTORS Age, Time since onset of injury/illness/exacerbation, and 1 comorbidity:    are also affecting patient's functional outcome.        GOALS:   SHORT TERM GOALS: Target date: 05/02/2022    Pt will become independent with HEP in order to demonstrate synthesis of PT education.  Goal status: MET   2.  Pt will report at least 2 pt reduction on NPRS scale for pain in order to demonstrate functional improvement with household activity, self care, and ADL.    Goal status: MET   3.  Pt will be able to report abolishment of HA in order to demonstrate functional improvement in C/S function for self-care and house hold duties.    Goal status: MET       LONG TERM GOALS: Target date: 06/19/2022    Pt  will become independent with final HEP in order to demonstrate synthesis of PT education.    Goal status: ongoing   2.  Pt will be able to demonstrate full C/S ROM without pain in order to demonstrate functional improvement in C/s function for return to PLOF.   Goal status: ongoing   3.  Pt will be able to demonstrate/report ability to sit/stand/sleep for extended periods of time without pain in order to demonstrate functional improvement and tolerance to static positioning.  Goal status: achieved regarding cervical pain  4.  Pt will score >/= 66 on FOTO to demonstrate improvement in perceived C/S function.  Baseline:  Goal status: ongoing  5.   Able to descend stairs without limitation by knee pain  Baseline:  Goal status: INITIAL  6.  Demo LE strength 95% of opposite side Baseline:  Goal status: INITIAL    PLAN: PT FREQUENCY: 1-2x/week   PT DURATION: 12 weeks (likely every other week, likely D/C in 8 wks)   PLANNED INTERVENTIONS: Therapeutic exercises, Therapeutic activity, Neuromuscular re-education, Balance training, Gait training, Patient/Family education, Self Care, Joint mobilization, Joint manipulation, Orthotic/Fit training, Aquatic Therapy, Dry Needling, Electrical stimulation, Spinal manipulation, Spinal mobilization, Cryotherapy, Moist heat, Compression bandaging, scar mobilization, Splintting, Taping, Vasopneumatic device, Traction, Ultrasound, Ionotophoresis 87m/ml Dexamethasone, Manual therapy, and Re-evaluation   PLAN FOR NEXT SESSION: DN L quad PRN, eccentric quad strength, vertical shin squat    ADaleen BoPT, DPT 06/11/22 11:57 AM

## 2022-06-12 DIAGNOSIS — M1712 Unilateral primary osteoarthritis, left knee: Secondary | ICD-10-CM | POA: Diagnosis not present

## 2022-06-12 DIAGNOSIS — M25462 Effusion, left knee: Secondary | ICD-10-CM | POA: Diagnosis not present

## 2022-06-18 ENCOUNTER — Encounter: Payer: Self-pay | Admitting: Obstetrics and Gynecology

## 2022-06-18 ENCOUNTER — Encounter (HOSPITAL_BASED_OUTPATIENT_CLINIC_OR_DEPARTMENT_OTHER): Payer: Self-pay | Admitting: Physical Therapy

## 2022-06-18 ENCOUNTER — Other Ambulatory Visit (HOSPITAL_COMMUNITY)
Admission: RE | Admit: 2022-06-18 | Discharge: 2022-06-18 | Disposition: A | Discharge status: Home / Self Care | Payer: PPO | Source: Ambulatory Visit | Attending: Obstetrics and Gynecology | Admitting: Obstetrics and Gynecology

## 2022-06-18 ENCOUNTER — Ambulatory Visit (INDEPENDENT_AMBULATORY_CARE_PROVIDER_SITE_OTHER): Payer: PPO | Admitting: Obstetrics and Gynecology

## 2022-06-18 ENCOUNTER — Ambulatory Visit (HOSPITAL_BASED_OUTPATIENT_CLINIC_OR_DEPARTMENT_OTHER): Payer: PPO | Admitting: Physical Therapy

## 2022-06-18 VITALS — BP 122/80 | HR 70 | Ht 62.5 in | Wt 143.0 lb

## 2022-06-18 DIAGNOSIS — Z124 Encounter for screening for malignant neoplasm of cervix: Secondary | ICD-10-CM

## 2022-06-18 DIAGNOSIS — Z9189 Other specified personal risk factors, not elsewhere classified: Secondary | ICD-10-CM

## 2022-06-18 DIAGNOSIS — E785 Hyperlipidemia, unspecified: Secondary | ICD-10-CM

## 2022-06-18 DIAGNOSIS — G8929 Other chronic pain: Secondary | ICD-10-CM

## 2022-06-18 DIAGNOSIS — Z87898 Personal history of other specified conditions: Secondary | ICD-10-CM

## 2022-06-18 DIAGNOSIS — Z01419 Encounter for gynecological examination (general) (routine) without abnormal findings: Secondary | ICD-10-CM

## 2022-06-18 DIAGNOSIS — M542 Cervicalgia: Secondary | ICD-10-CM | POA: Diagnosis not present

## 2022-06-18 DIAGNOSIS — N952 Postmenopausal atrophic vaginitis: Secondary | ICD-10-CM

## 2022-06-18 DIAGNOSIS — R7303 Prediabetes: Secondary | ICD-10-CM | POA: Diagnosis not present

## 2022-06-18 NOTE — Patient Instructions (Signed)

## 2022-06-18 NOTE — Therapy (Signed)
OUTPATIENT PHYSICAL THERAPY CERVICAL TREATMENT   Patient Name: Ashley Blair MRN: [id number] DOB:1955-12-21, 66 y.o., female Today's Date: 06/18/2022   PT End of Session - 06/18/22 1134     Visit Number 13    Number of Visits 18    Date for PT Re-Evaluation 07/23/22    Authorization Type HTA    PT Start Time 1140    PT Stop Time 1220    PT Time Calculation (min) 40 min    Activity Tolerance Patient tolerated treatment well;Patient limited by pain    Behavior During Therapy [facility] for tasks assessed/performed                    Past Medical History:  Diagnosis Date   Elevated hemoglobin A1c 2019   5.7   Family history of DES exposure    Fibrocystic breast    GERD (gastroesophageal reflux disease)    HPV (human papilloma virus) infection    Hypercholesterolemia    OCD (obsessive compulsive disorder)    S/P excision of lipoma    Past Surgical History:  Procedure Laterality Date   ANKLE SURGERY     broken as a child   DILATION AND CURETTAGE OF UTERUS     LIPOMA EXCISION     rib cage   Patient Active Problem List   Diagnosis Date Noted   Family history of breast cancer in mother 12/12/2013   H/O diethylstilbestrol (DES) exposure in utero 12/12/2013    PCP:  Aretta Nip, MD     REFERRING PROVIDER: Simona Huh, NP Gentry Fitz, MD  REFERRING DIAG: M54.2 (ICD-10-CM) - Cervicalgia    Bilateral knee osteoarthritis  THERAPY DIAG:  Cervicalgia  Chronic pain of right knee  Chronic pain of left knee  Rationale for Evaluation and Treatment Rehabilitation  ONSET DATE: 01/2022  SUBJECTIVE:                                                 SUBJECTIVE STATEMENT:  Pt states the L knee ifelt better after last session. She did get a gel shot last week and has another one planned for this week. She feels there is a "burning" in the back of the knee. Pain that goes into the calf. The knee sleeve is really bothersome and painful.   Eval:   Neck feels 95% better. ROM is so much better. Still have a really tight/tender spot here (pointing in left upper trap)  4 years since left knee, 2 yr since right knee. Pain is behind knee. Had gel injections in right knee which helped. Steroid injections in left knee did not work. Over this last weekend knee got really bad- anything that bends my knee to the extreme hurts. Walked 3 miles instead of 4 or 5.  Taking naprosyn daily, meloxicam did not help.   PERTINENT HISTORY:  N/A  PAIN:  Are you having pain? Yes: NPRS scale: 1/10; 5/10 at knee Pain location: L upper trap tightness Pain description: aching, cramping, dull, sharp, spasm Aggravating factors: constant Relieving factors: resting head  PAIN:  Are you having pain? Yes: NPRS scale: severe/10 Pain location: post left knee Pain description: pinching, sharp Aggravating factors: knee flexion Relieving factors: keeping it straighter  PRECAUTIONS: None  WEIGHT BEARING RESTRICTIONS No  FALLS:  Has patient fallen in last 6 months? No  LIVING ENVIRONMENT: Lives with: lives with their family  OCCUPATION: former fitness Art therapist  PLOF: Independent  PATIENT GOALS : reduce pain  OBJECTIVE:   DIAGNOSTIC FINDINGS:  MRI Impression: from [facility] radiology Degenerative changes of the cervical spine, more pronounced C5-6 where there is moderate spinal canal stenosis with mass effect on the cord without cord signal abnormality. Mild spinal canal stenosis at C3-4, C4-5, and C6-7. Multilevel neural foraminal narrowing, as described above.    PATIENT SURVEYS:  FOTO 63 66 @ DC  MMT lb Right 10/25 Left 10/25  Hip abduction 39.2 41.4  Knee flexion 32.6 17.9  Knee extension 40.1 34.4   (Blank rows = not tested)   TODAY'S TREATMENT:   11/15  STM L rec fem and VL; L gastroc soleus  L knee flexion grade IV mob, tibial posterior glide  -leg press calf raise with stretch at end range 3s 20x 25lbs (life fitness  machine) -Staggered leg press 40lbs (life fitness machine) 3x5 -LE alignment with squatting/pilates exercise  Trigger Point Dry Needling, Manual Therapy Treatment:  Initial or subsequent education regarding Trigger Point Dry Needling: Subsequent Did patient give consent to treatment with Trigger Point Dry Needling: Yes TPDN with skilled palpation and monitoring followed by STM to the following muscles:  L rec fem/vastus intermedius, L gastroc/soleus  Treatment    11/8  STM L rec fem and VL; L UT L knee flexion grade IV mob, tibial posterior glide  - Sitting Knee Extension with Resistance black TB -marching bridge 2x10 -L heel tap 4" box 10x (pain medially)  Addition of HS and glute exercise    Trigger Point Dry Needling, Manual Therapy Treatment:  Initial or subsequent education regarding Trigger Point Dry Needling: Subsequent Did patient give consent to treatment with Trigger Point Dry Needling: Yes TPDN with skilled palpation and monitoring followed by STM to the following muscles: Lt upper trap, L rec fem/vastus intermedius  11/1  STM L rec fem and VL L knee flexion grade IV mob, tibial posterior glide  Stair management for pain avoidance: hip strategy does not change pain, advised to do step to until L quad eccentric strength improves  Exercises - Single Leg Squat with Chair Touch  - 1 x daily - 3 x weekly - 4 sets - 6 reps - Wall Sit  - 1 x daily - 3 x weekly - 1 sets - 3 reps - 20 hold - Sitting Knee Extension with Resistance  - 1 x daily - 3 x weekly - 3 sets - 10 reps - Quadriceps Mobilization with Foam Roll  - 1 x daily - 7 x weekly - 1 sets - 1 reps - 2-5 hold  05/28/22:  Prone passive quad stretch Trigger Point Dry Needling, Manual Therapy Treatment:  Initial or subsequent education regarding Trigger Point Dry Needling: Subsequent Did patient give consent to treatment with Trigger Point Dry Needling: Yes TPDN with skilled palpation and monitoring followed by  STM to the following muscles: Lt upper trap  Glut use in sit<>stand Stair training- gluts for half way down and then fall forward    10/18:  Trigger Point Dry Needling, Manual Therapy Treatment:  Initial or subsequent education regarding Trigger Point Dry Needling: Subsequent Did patient give consent to treatment with Trigger Point Dry Needling: Yes TPDN with skilled palpation and monitoring followed by  STM to the following muscles: L upper trap, bilat C3-5 multifidi  Manual: Prone UPA Grade III C3-6  STM L UT and bilat C/S paraspinals  Exercises - Quadruped Cervical Retraction  - 1 x daily - 7 x weekly - 2 sets - 10 reps - Gentle Levator Scapulae Stretch  - 2 x daily - 7 x weekly - 1 sets - 3 reps - 30 hold - Shoulder External Rotation and Scapular Retraction with Resistance  - 1 x daily - 7 x weekly - 2 sets - 10 reps - Supine Cervical Rotation PROM  - 3 x daily - 7 x weekly - 1 sets - 5 reps - 1 breath hold - Standing Shoulder Row with Anchored Resistance  - 1 x daily - 7 x weekly - 3 sets - 10 reps - Wall Angels  - 1-2 x daily - 7 x weekly - 1 sets - 10 reps  10/11:  Trigger Point Dry Needling, Manual Therapy Treatment:  Initial or subsequent education regarding Trigger Point Dry Needling: Subsequent Did patient give consent to treatment with Trigger Point Dry Needling: Yes TPDN with skilled palpation and monitoring followed by   STM to the following muscles: Lt upper trap  Manual: Supine first rib depression mob grade III Grade III C3-6 side glide STM L UT   PATIENT EDUCATION:  Education details:  anatomy, inflammation/edema management, exercise progression, exercise modification, acceptable levels of pain, muscle firing, HEP, POC Person educated: Patient Education method: Explanation, Demonstration, Tactile cues, Verbal cues, and Handouts Education comprehension: verbalized understanding, returned demonstration, verbal cues required, tactile cues required, and  needs further education     HOME EXERCISE PROGRAM:  Access Code: [email or web address] URL: [email or web address]  ASSESSMENT:   CLINICAL IMPRESSION: Patient with improvement in posterior knee pain and anterior knee pain following joint mobilizations and soft tissue mobilization.  Patient had localized twitch response in both gastroc and rectus femoris with TPDN.  Patient was able to perform full standing knee flexion via quad stretch post treatment session.  Patient reports reduction in pain and posterior knee and was able to progress with functional loading of the left calf as well as quadricep.  Plan to introduce single-leg RDL type motion at next session in order to improve left lower extremity motor control.  Continue with quad eccentric's as tolerated.  Pt would benefit from continued skilled therapy in order to reach goals and maximize functional LE strength and ROM for prolonging of future surgical intervention.   OBJECTIVE IMPAIRMENTS decreased ROM, decreased strength, hypomobility, increased fascial restrictions, increased muscle spasms, impaired flexibility, improper body mechanics, postural dysfunction, and pain.    ACTIVITY LIMITATIONS carrying, lifting, sitting, stairs   PARTICIPATION LIMITATIONS: driving, shopping, community activity, occupation, and yard work   PERSONAL FACTORS Age, Time since onset of injury/illness/exacerbation, and 1 comorbidity:    are also affecting patient's functional outcome.        GOALS:   SHORT TERM GOALS: Target date: 05/02/2022    Pt will become independent with HEP in order to demonstrate synthesis of PT education.  Goal status: MET   2.  Pt will report at least 2 pt reduction on NPRS scale for pain in order to demonstrate functional improvement with household activity, self care, and ADL.    Goal status: MET   3.  Pt will be able to report abolishment of HA in order to demonstrate functional improvement in C/S function for self-care  and house hold duties.    Goal status: MET       LONG TERM GOALS: Target date: 06/19/2022    Pt  will become independent with final HEP in order to demonstrate synthesis  of PT education.    Goal status: ongoing   2.  Pt will be able to demonstrate full C/S ROM without pain in order to demonstrate functional improvement in C/s function for return to PLOF.   Goal status: ongoing   3.  Pt will be able to demonstrate/report ability to sit/stand/sleep for extended periods of time without pain in order to demonstrate functional improvement and tolerance to static positioning.  Goal status: achieved regarding cervical pain  4.  Pt will score >/= 66 on FOTO to demonstrate improvement in perceived C/S function.  Baseline:  Goal status: ongoing  5.  Able to descend stairs without limitation by knee pain Baseline:  Goal status: INITIAL  6.  Demo LE strength 95% of opposite side Baseline:  Goal status: INITIAL    PLAN: PT FREQUENCY: 1-2x/week   PT DURATION: 12 weeks (likely every other week, likely D/C in 8 wks)   PLANNED INTERVENTIONS: Therapeutic exercises, Therapeutic activity, Neuromuscular re-education, Balance training, Gait training, Patient/Family education, Self Care, Joint mobilization, Joint manipulation, Orthotic/Fit training, Aquatic Therapy, Dry Needling, Electrical stimulation, Spinal manipulation, Spinal mobilization, Cryotherapy, Moist heat, Compression bandaging, scar mobilization, Splintting, Taping, Vasopneumatic device, Traction, Ultrasound, Ionotophoresis 1m/ml Dexamethasone, Manual therapy, and Re-evaluation   PLAN FOR NEXT SESSION: DN L quad PRN, eccentric quad strength, vertical shin squat, SL RDL    ADaleen BoPT, DPT 06/18/22 12:44 PM

## 2022-06-18 NOTE — Progress Notes (Signed)
66 y.o. G1P0 Married Caucasian female here for breast and pelvic    No concerns today.  No vaginal bleeding or pain.   Cardiac calcium score is zero.   PCP:   Dr. Louis Matte  Patient's last menstrual period was 08/04/2002 (approximate).           Sexually active: No.  The current method of family planning is post menopausal status.    Exercising: Yes.    Home exercise routine includes walking 4-5 hrs per days. Gym/ health club routine includes cardio. Smoker:  no  Health Maintenance: Pap:  05/30/21 HPV HR neg,  Hx of DES exposure.   02-13-20 Neg, 02-07-19 Neg:Neg HR HPV, 01-18-18 Neg:Neg HR HPV  History of abnormal Pap:  no MMG:  04/08/22 BI RADS CATEGORY 1 Neg, cat B density.  Colonoscopy:  11/2016 normal.  Due in 2028. BMD:   07/09/21  Result  normal TDaP:  n/a Gardasil:   no Screening Labs:  PCP   reports that she quit smoking about 46 years ago. Her smoking use included cigarettes. She has never used smokeless tobacco. She reports current alcohol use of about 1.0 standard drink of alcohol per week. She reports that she does not use drugs.  Past Medical History:  Diagnosis Date   Elevated hemoglobin A1c 2019   5.7   Family history of DES exposure    Fibrocystic breast    GERD (gastroesophageal reflux disease)    HPV (human papilloma virus) infection    Hypercholesterolemia    OCD (obsessive compulsive disorder)    S/P excision of lipoma     Past Surgical History:  Procedure Laterality Date   ANKLE SURGERY     broken as a child   DILATION AND CURETTAGE OF UTERUS     LIPOMA EXCISION     rib cage    Current Outpatient Medications  Medication Sig Dispense Refill   CALCIUM PO Take 500 mg by mouth 2 (two) times daily.     COVID-19 mRNA bivalent vaccine, Pfizer, (PFIZER COVID-19 VAC BIVALENT) injection Inject into the muscle. 0.3 mL 0   DEXILANT 60 MG capsule Take 1 capsule by mouth daily.     escitalopram (LEXAPRO) 10 MG tablet Take 10 mg by mouth daily.     influenza vaccine  adjuvanted (FLUAD) 0.5 ML injection Inject into the muscle. 0.5 mL 0   naproxen (NAPROSYN) 500 MG tablet Take 500 mg by mouth 2 (two) times daily.     Probiotic Product (ALIGN) 4 MG CAPS Take 1 capsule by mouth daily.     methocarbamol (ROBAXIN) 500 MG tablet SMARTSIG:1-2 Tablet(s) By Mouth 2-3 Times Daily PRN (Patient not taking: Reported on 03/04/2022)     No current facility-administered medications for this visit.    Family History  Problem Relation Age of Onset   Breast cancer Mother    Osteoporosis Maternal Grandmother    Stroke Maternal Grandfather     Review of Systems  All other systems reviewed and are negative.   Exam:   BP 122/80 (BP Location: Left Arm, Patient Position: Sitting, Cuff Size: Normal)   Pulse 70   Ht 5' 2.5" (1.588 m)   Wt 143 lb (64.9 kg)   LMP 08/04/2002 (Approximate)   BMI 25.74 kg/m     General appearance: alert, cooperative and appears stated age Head: normocephalic, without obvious abnormality, atraumatic Neck: no adenopathy, supple, symmetrical, trachea midline and thyroid normal to inspection and palpation Lungs: clear to auscultation bilaterally Breasts: normal appearance, no  masses or tenderness, No nipple retraction or dimpling, No nipple discharge or bleeding, No axillary adenopathy Heart: regular rate and rhythm Abdomen: soft, non-tender; no masses, no organomegaly Extremities: extremities normal, atraumatic, no cyanosis or edema Skin: skin color, texture, turgor normal. No rashes or lesions Lymph nodes: cervical, supraclavicular, and axillary nodes normal. Neurologic: grossly normal  Pelvic: External genitalia:  no lesions              No abnormal inguinal nodes palpated.              Urethra:  normal appearing urethra with no masses, tenderness or lesions              Bartholins and Skenes: normal                 Vagina: normal appearing vagina with normal color and discharge, no lesions.  Vaginal atrophy noted.               Cervix:  no lesions              Pap taken: yes Bimanual Exam:  Uterus:  normal size, contour, position, consistency, mobility, non-tender              Adnexa: no mass, fullness, tenderness              Rectal exam: yes.  Confirms.              Anus:  normal sphincter tone, no lesions  Chaperone was present for exam:  Kimalexis  Assessment:   Well woman visit with gynecologic exam. Hx DES exposure.  Vaginal atrophy.  FH of breast cancer.  Genetic testing negative in her mother.  Hx elevated HgbA1C. Elevated cholesterol.  Plan: Mammogram screening discussed. Self breast awareness reviewed. Pap and reflex Hr HPV collected. Guidelines for Calcium, Vitamin D, regular exercise program including cardiovascular and weight bearing exercise. Routine labs with PCP.   Follow up annually and prn.   After visit summary provided.

## 2022-06-19 DIAGNOSIS — M1712 Unilateral primary osteoarthritis, left knee: Secondary | ICD-10-CM | POA: Diagnosis not present

## 2022-06-20 LAB — CYTOLOGY - PAP: Diagnosis: NEGATIVE

## 2022-06-24 ENCOUNTER — Ambulatory Visit (HOSPITAL_BASED_OUTPATIENT_CLINIC_OR_DEPARTMENT_OTHER): Payer: PPO | Admitting: Physical Therapy

## 2022-06-24 ENCOUNTER — Encounter (HOSPITAL_BASED_OUTPATIENT_CLINIC_OR_DEPARTMENT_OTHER): Payer: Self-pay | Admitting: Physical Therapy

## 2022-06-24 DIAGNOSIS — M542 Cervicalgia: Secondary | ICD-10-CM | POA: Diagnosis not present

## 2022-06-24 DIAGNOSIS — G8929 Other chronic pain: Secondary | ICD-10-CM

## 2022-06-24 NOTE — Therapy (Signed)
OUTPATIENT PHYSICAL THERAPY CERVICAL TREATMENT   Patient Name: Ashley Blair MRN: [id number] DOB:01/06/1956, 66 y.o., female Today's Date: 06/24/2022   PT End of Session - 06/24/22 1014     Visit Number 14    Number of Visits 18    Date for PT Re-Evaluation 07/23/22    Authorization Type HTA    PT Start Time 1015    PT Stop Time 1056    PT Time Calculation (min) 41 min    Activity Tolerance Patient tolerated treatment well;Patient limited by pain    Behavior During Therapy [facility] for tasks assessed/performed                    Past Medical History:  Diagnosis Date   Elevated hemoglobin A1c 2019   5.7   Family history of DES exposure    Fibrocystic breast    GERD (gastroesophageal reflux disease)    HPV (human papilloma virus) infection    Hypercholesterolemia    OCD (obsessive compulsive disorder)    S/P excision of lipoma    Past Surgical History:  Procedure Laterality Date   ANKLE SURGERY     broken as a child   DILATION AND CURETTAGE OF UTERUS     LIPOMA EXCISION     rib cage   Patient Active Problem List   Diagnosis Date Noted   Family history of breast cancer in mother 12/12/2013   H/O diethylstilbestrol (DES) exposure in utero 12/12/2013    PCP:  Rankins, Bill Salinas, MD     REFERRING PROVIDER: Simona Huh, NP Gentry Fitz, MD  REFERRING DIAG: M54.2 (ICD-10-CM) - Cervicalgia    Bilateral knee osteoarthritis  THERAPY DIAG:  Cervicalgia  Chronic pain of right knee  Chronic pain of left knee  Rationale for Evaluation and Treatment Rehabilitation  ONSET DATE: 01/2022  SUBJECTIVE:                                                 SUBJECTIVE STATEMENT:  Pt states that she was doing better then over the weekend, she started having pain. Pt states she got the gel shot on Thursday. A full knee bend/quad stretch is still uncomfortable and painful.   Eval:  Neck feels 95% better. ROM is so much better. Still have a really  tight/tender spot here (pointing in left upper trap)  4 years since left knee, 2 yr since right knee. Pain is behind knee. Had gel injections in right knee which helped. Steroid injections in left knee did not work. Over this last weekend knee got really bad- anything that bends my knee to the extreme hurts. Walked 3 miles instead of 4 or 5.  Taking naprosyn daily, meloxicam did not help.   PERTINENT HISTORY:  N/A  PAIN:  Are you having pain? Yes: NPRS scale: 1/10; 5/10 at knee Pain location: L upper trap tightness Pain description: aching, cramping, dull, sharp, spasm Aggravating factors: constant Relieving factors: resting head  PAIN:  Are you having pain? Yes: NPRS scale: severe/10 Pain location: post left knee Pain description: pinching, sharp Aggravating factors: knee flexion Relieving factors: keeping it straighter  PRECAUTIONS: None  WEIGHT BEARING RESTRICTIONS No  FALLS:  Has patient fallen in last 6 months? 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 Mild spinal canal stenosis at C3-4, C4-5, and C6-7. Multilevel neural foraminal narrowing, as described above.    PATIENT SURVEYS:  FOTO 63 66 @ DC  MMT lb Right 10/25 Left 10/25  Hip abduction 39.2 41.4  Knee flexion 32.6 17.9  Knee extension 40.1 34.4   (Blank rows = not tested)   TODAY'S TREATMENT:   11/21  -kneeling hip hinge/squat 2x10 -SL leg press shuttle 56lbs 2x20 -TKE GTB 20x -SL RDL 2x10 -knee extension machine up2 down 1 20lbs 4x6 -bridge on ball with HS curl 3x10   11/15  STM L rec fem and VL; L gastroc soleus  L knee flexion grade  IV mob, tibial posterior glide  -leg press calf raise with stretch at end range 3s 20x 25lbs (life fitness machine) -Staggered leg press 40lbs (life fitness machine) 3x5 -LE alignment with squatting/pilates exercise  Trigger Point Dry Needling, Manual Therapy Treatment:  Initial or subsequent education regarding Trigger Point Dry Needling: Subsequent Did patient give consent to treatment with Trigger Point Dry Needling: Yes TPDN with skilled palpation and monitoring followed by STM to the following muscles:  L rec fem/vastus intermedius, L gastroc/soleus  Treatment    11/8  STM L rec fem and VL; L UT L knee flexion grade IV mob, tibial posterior glide  - Sitting Knee Extension with Resistance black TB -marching bridge 2x10 -L heel tap 4" box 10x (pain medially)  Addition of HS and glute exercise    Trigger Point Dry Needling, Manual Therapy Treatment:  Initial or subsequent education regarding Trigger Point Dry Needling: Subsequent Did patient give consent to treatment with Trigger Point Dry Needling: Yes TPDN with skilled palpation and monitoring followed by STM to the following muscles: Lt upper trap, L rec fem/vastus intermedius  11/1  STM L rec fem and VL L knee flexion grade IV mob, tibial posterior glide  Stair management for pain avoidance: hip strategy does not change pain, advised to do step to until L quad eccentric strength improves  Exercises - Single Leg Squat with Chair Touch  - 1 x daily - 3 x weekly - 4 sets - 6 reps - Wall Sit  - 1 x daily - 3 x weekly - 1 sets - 3 reps - 20 hold - Sitting Knee Extension with Resistance  - 1 x daily - 3 x weekly - 3 sets - 10 reps - Quadriceps Mobilization with Foam Roll  - 1 x daily - 7 x weekly - 1 sets - 1 reps - 2-5 hold  05/28/22:  Prone passive quad stretch Trigger Point Dry Needling, Manual Therapy Treatment:  Initial or subsequent education regarding Trigger Point Dry Needling: Subsequent Did patient give  consent to treatment with Trigger Point Dry Needling: Yes TPDN with skilled palpation and monitoring followed by STM to the following muscles: Lt upper trap  Glut use in sit<>stand Stair training- gluts for half way down and then fall forward    10/18:  Trigger Point Dry Needling, Manual Therapy Treatment:  Initial or subsequent education regarding Trigger Point Dry Needling: Subsequent Did patient give consent to treatment with Trigger Point Dry Needling: Yes TPDN with skilled palpation and monitoring followed by  STM to the following muscles: L upper trap, bilat C3-5  multifidi  Manual: Prone UPA Grade III C3-6  STM L UT and bilat C/S paraspinals  Exercises - Quadruped Cervical Retraction  - 1 x daily - 7 x weekly - 2 sets - 10 reps - Gentle Levator Scapulae Stretch  - 2 x daily - 7 x weekly - 1 sets - 3 reps - 30 hold - Shoulder External Rotation and Scapular Retraction with Resistance  - 1 x daily - 7 x weekly - 2 sets - 10 reps - Supine Cervical Rotation PROM  - 3 x daily - 7 x weekly - 1 sets - 5 reps - 1 breath hold - Standing Shoulder Row with Anchored Resistance  - 1 x daily - 7 x weekly - 3 sets - 10 reps - Wall Angels  - 1-2 x daily - 7 x weekly - 1 sets - 10 reps  10/11:  Trigger Point Dry Needling, Manual Therapy Treatment:  Initial or subsequent education regarding Trigger Point Dry Needling: Subsequent Did patient give consent to treatment with Trigger Point Dry Needling: Yes TPDN with skilled palpation and monitoring followed by   STM to the following muscles: Lt upper trap  Manual: Supine first rib depression mob grade III Grade III C3-6 side glide STM L UT   PATIENT EDUCATION:  Education details:  anatomy, inflammation/edema management, exercise progression, exercise modification, acceptable levels of pain, muscle firing, HEP, POC Person educated: Patient Education method: Explanation, Demonstration, Tactile cues, Verbal cues, and Handouts Education  comprehension: verbalized understanding, returned demonstration, verbal cues required, tactile cues required, and needs further education     HOME EXERCISE PROGRAM:  Access Code: [email or web address] URL: [email or web address]  ASSESSMENT:   CLINICAL IMPRESSION: Pt able to progress with functional loading of the L quad/knee at today's session with minimal pain/discomfort. Pt advised that there likely will be low level pain but not to exceed 3/10 at home with limited stretching. Pt able to do more slow eccentrics into knee flexion today with improvement in knee flexion by end of session. Pt advised that there will likely be a baseline stiffness that will improvement with ocntinued movement and a dynamic warm up.  Plan to continue with functional left knee loading as tolerated with focus on quad activation and single-leg stability as patient tends to offload left lower extremity onto right with CKC exercise.  Pt would benefit from continued skilled therapy in order to reach goals and maximize functional LE strength and ROM for prolonging of future surgical intervention.   OBJECTIVE IMPAIRMENTS decreased ROM, decreased strength, hypomobility, increased fascial restrictions, increased muscle spasms, impaired flexibility, improper body mechanics, postural dysfunction, and pain.    ACTIVITY LIMITATIONS carrying, lifting, sitting, stairs   PARTICIPATION LIMITATIONS: driving, shopping, community activity, occupation, and yard work   PERSONAL FACTORS Age, Time since onset of injury/illness/exacerbation, and 1 comorbidity:    are also affecting patient's functional outcome.        GOALS:   SHORT TERM GOALS: Target date: 05/02/2022    Pt will become independent with HEP in order to demonstrate synthesis of PT education.  Goal status: MET   2.  Pt will report at least 2 pt reduction on NPRS scale for pain in order to demonstrate functional improvement with household activity, self care, and ADL.     Goal status: MET   3.  Pt will be able to report abolishment of HA in order to demonstrate functional improvement in C/S function for self-care and house hold duties.    Goal status: MET  LONG TERM GOALS: Target date: 06/19/2022    Pt  will become independent with final HEP in order to demonstrate synthesis of PT education.    Goal status: ongoing   2.  Pt will be able to demonstrate full C/S ROM without pain in order to demonstrate functional improvement in C/s function for return to PLOF.   Goal status: ongoing   3.  Pt will be able to demonstrate/report ability to sit/stand/sleep for extended periods of time without pain in order to demonstrate functional improvement and tolerance to static positioning.  Goal status: achieved regarding cervical pain  4.  Pt will score >/= 66 on FOTO to demonstrate improvement in perceived C/S function.  Baseline:  Goal status: ongoing  5.  Able to descend stairs without limitation by knee pain Baseline:  Goal status: INITIAL  6.  Demo LE strength 95% of opposite side Baseline:  Goal status: INITIAL    PLAN: PT FREQUENCY: 1-2x/week   PT DURATION: 12 weeks (likely every other week, likely D/C in 8 wks)   PLANNED INTERVENTIONS: Therapeutic exercises, Therapeutic activity, Neuromuscular re-education, Balance training, Gait training, Patient/Family education, Self Care, Joint mobilization, Joint manipulation, Orthotic/Fit training, Aquatic Therapy, Dry Needling, Electrical stimulation, Spinal manipulation, Spinal mobilization, Cryotherapy, Moist heat, Compression bandaging, scar mobilization, Splintting, Taping, Vasopneumatic device, Traction, Ultrasound, Ionotophoresis 87m/ml Dexamethasone, Manual therapy, and Re-evaluation   PLAN FOR NEXT SESSION: DN L quad PRN, eccentric quad strength, vertical shin squat, SL RDL    ADaleen BoPT, DPT 06/24/22 11:01 AM

## 2022-06-30 ENCOUNTER — Encounter (HOSPITAL_BASED_OUTPATIENT_CLINIC_OR_DEPARTMENT_OTHER): Payer: Self-pay

## 2022-06-30 ENCOUNTER — Ambulatory Visit (HOSPITAL_BASED_OUTPATIENT_CLINIC_OR_DEPARTMENT_OTHER): Payer: PPO | Admitting: Physical Therapy

## 2022-07-10 ENCOUNTER — Encounter (HOSPITAL_BASED_OUTPATIENT_CLINIC_OR_DEPARTMENT_OTHER): Payer: Self-pay

## 2022-07-10 ENCOUNTER — Encounter (HOSPITAL_BASED_OUTPATIENT_CLINIC_OR_DEPARTMENT_OTHER): Payer: PPO | Admitting: Physical Therapy

## 2022-07-11 DIAGNOSIS — M1712 Unilateral primary osteoarthritis, left knee: Secondary | ICD-10-CM | POA: Diagnosis not present

## 2022-07-17 NOTE — Therapy (Signed)
OUTPATIENT PHYSICAL THERAPY CERVICAL TREATMENT   Patient Name: Ashley Blair MRN: [id number] DOB:1956-03-02, 66 y.o., female Today's Date: 07/18/2022   PT End of Session - 07/18/22 1148     Visit Number 15    Number of Visits 23    Date for PT Re-Evaluation 09/26/22    Authorization Type HTA    PT Start Time 1145    PT Stop Time 1225    PT Time Calculation (min) 40 min    Activity Tolerance Patient tolerated treatment well    Behavior During Therapy WFL for tasks assessed/performed                     Past Medical History:  Diagnosis Date   Elevated hemoglobin A1c 2019   5.7   Family history of DES exposure    Fibrocystic breast    GERD (gastroesophageal reflux disease)    HPV (human papilloma virus) infection    Hypercholesterolemia    OCD (obsessive compulsive disorder)    S/P excision of lipoma    Past Surgical History:  Procedure Laterality Date   ANKLE SURGERY     broken as a child   DILATION AND CURETTAGE OF UTERUS     LIPOMA EXCISION     rib cage   Patient Active Problem List   Diagnosis Date Noted   Family history of breast cancer in mother 12/12/2013   H/O diethylstilbestrol (DES) exposure in utero 12/12/2013    PCP:  Aretta Nip, MD     REFERRING PROVIDER: Simona Huh, NP Gentry Fitz, MD  REFERRING DIAG: M54.2 (ICD-10-CM) - Cervicalgia    Bilateral knee osteoarthritis  THERAPY DIAG:  Cervicalgia  Chronic pain of right knee  Chronic pain of left knee  Rationale for Evaluation and Treatment Rehabilitation  ONSET DATE: 01/2022  SUBJECTIVE:                                                 SUBJECTIVE STATEMENT:  My neck has just never gone away completely. It is like a rubber band in there. I have had all 3 gel injections in knee since last Friday- it has definitely helped. Lt lateral hip is aggrivated- it is not as bad when I walk with my lift in my right shoe.   PERTINENT HISTORY:  N/A  PAIN:  Are  you having pain? Yes: NPRS scale: moderate- neck/10; 5/10 at knee Pain location: L upper trap tightness Pain description: aching, cramping, dull, sharp, spasm Aggravating factors: constant Relieving factors: resting head   PRECAUTIONS: None  WEIGHT BEARING RESTRICTIONS No  FALLS:  Has patient fallen in last 6 months? 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 radiology Degenerative changes of the cervical spine, more pronounced C5-6 where there is moderate spinal canal stenosis with mass effect on the cord without cord signal abnormality. Mild spinal canal stenosis at C3-4, C4-5, and C6-7. Multilevel neural foraminal narrowing, as described above.    PATIENT SURVEYS:  FOTO 63 66 @ DC  MMT lb Right 10/25 Left 10/25  Hip abduction 39.2 41.4  Knee flexion 32.6 17.9  Knee extension 40.1 34.4   (Blank rows = not tested)  07/18/22  cervical AROM Flexion  34 SB Rt/Lb 20/28 Rot Rt/Lt 24/20   TODAY'S TREATMENT:   Treatment                            07/18/22:  Trigger Point Dry Needling, Manual Therapy Treatment:  Initial or subsequent education regarding Trigger Point Dry Needling: Subsequent Did patient give consent to treatment with Trigger Point Dry Needling: Yes TPDN with skilled palpation and monitoring followed by STM to the following muscles: Lt glut med/min TFL; left upper trap, splenius capitus, Lt c3-5 paraspinals  Manual prone rib mobs Seated figure 4 stretch Discussed OKC hip abd strengthening  11/21  -kneeling hip hinge/squat 2x10 -SL leg press shuttle 56lbs 2x20 -TKE GTB 20x -SL RDL 2x10 -knee extension machine up2 down 1 20lbs 4x6 -bridge on ball with HS curl 3x10   11/15  STM L rec fem and VL; L gastroc soleus  L knee flexion grade IV mob, tibial posterior glide  -leg  press calf raise with stretch at end range 3s 20x 25lbs (life fitness machine) -Staggered leg press 40lbs (life fitness machine) 3x5 -LE alignment with squatting/pilates exercise  Trigger Point Dry Needling, Manual Therapy Treatment:  Initial or subsequent education regarding Trigger Point Dry Needling: Subsequent Did patient give consent to treatment with Trigger Point Dry Needling: Yes TPDN with skilled palpation and monitoring followed by STM to the following muscles:  L rec fem/vastus intermedius, L gastroc/soleus    PATIENT EDUCATION:  Education details:  anatomy, inflammation/edema management, exercise progression, exercise modification, acceptable levels of pain, muscle firing, HEP, POC Person educated: Patient Education method: Explanation, Demonstration, Tactile cues, Verbal cues, and Handouts Education comprehension: verbalized understanding, returned demonstration, verbal cues required, tactile cues required, and needs further education     HOME EXERCISE PROGRAM:  Access Code: [email or web address] URL: [email or web address]  ASSESSMENT:   CLINICAL IMPRESSION: Patient has been away from therapy for extended period of time due to contracting COVID-19.  This has resulted in increased tension through the cervical thoracic region and increased pain and cervical motion.  Subsequently has increased pain in the left hip abductor group.  We discussed increasing use of heel lift in right shoe which she is currently at 2 layers and will consider moving back up to 3 layers as needed depending on symptoms following dry needling today.  Plan of care extended in order to return to progress towards long-term functional goals.  OBJECTIVE IMPAIRMENTS decreased ROM, decreased strength, hypomobility, increased fascial restrictions, increased muscle spasms, impaired flexibility, improper body mechanics, postural dysfunction, and pain.    ACTIVITY LIMITATIONS carrying, lifting, sitting, stairs    PARTICIPATION LIMITATIONS: driving, shopping, community activity, occupation, and yard work   PERSONAL FACTORS Age, Time since onset of injury/illness/exacerbation, and 1 comorbidity:    are also affecting patient's functional outcome.        GOALS:   SHORT TERM GOALS: Target date: 05/02/2022    Pt will become independent with HEP in order to demonstrate synthesis of PT education.  Goal status: MET   2.  Pt will report at least 2 pt reduction on NPRS scale for pain in order to demonstrate functional improvement with household activity, self care, and ADL.    Goal status: MET   3.  Pt will be able to report abolishment of HA in order to demonstrate functional improvement in C/S function for self-care and house hold duties.    Goal status: MET  LONG TERM GOALS: Target date: POC DATE    Pt  will become independent with final HEP in order to demonstrate synthesis of PT education.    Goal status: ongoing   2.  Pt will be able to demonstrate full C/S ROM without pain in order to demonstrate functional improvement in C/s function for return to PLOF.   Goal status: ongoing   3.  Pt will be able to demonstrate/report ability to sit/stand/sleep for extended periods of time without pain in order to demonstrate functional improvement and tolerance to static positioning.  Goal status: achieved regarding cervical pain  4.  Pt will score >/= 66 on FOTO to demonstrate improvement in perceived C/S function.  Baseline:  Goal status: ongoing  5.  Able to descend stairs without limitation by knee pain Baseline:  Goal status: INITIAL  6.  Demo LE strength 95% of opposite side Baseline:  Goal status: INITIAL    PLAN: PT FREQUENCY: 1-2x/week   PT DURATION: 12 weeks (likely every other week, likely D/C in 8 wks)   PLANNED INTERVENTIONS: Therapeutic exercises, Therapeutic activity, Neuromuscular re-education, Balance training, Gait training, Patient/Family education, Self Care,  Joint mobilization, Joint manipulation, Orthotic/Fit training, Aquatic Therapy, Dry Needling, Electrical stimulation, Spinal manipulation, Spinal mobilization, Cryotherapy, Moist heat, Compression bandaging, scar mobilization, Splintting, Taping, Vasopneumatic device, Traction, Ultrasound, Ionotophoresis 38m/ml Dexamethasone, Manual therapy, and Re-evaluation   PLAN FOR NEXT SESSION: DN PRN to cervical & hip region, hip abd strength testing & review OKC exercises.    Svara Twyman C. Collin Hendley PT, DPT 07/18/22 12:31 PM

## 2022-07-18 ENCOUNTER — Encounter (HOSPITAL_BASED_OUTPATIENT_CLINIC_OR_DEPARTMENT_OTHER): Payer: Self-pay | Admitting: Physical Therapy

## 2022-07-18 ENCOUNTER — Ambulatory Visit (HOSPITAL_BASED_OUTPATIENT_CLINIC_OR_DEPARTMENT_OTHER): Payer: PPO | Attending: Family Medicine | Admitting: Physical Therapy

## 2022-07-18 DIAGNOSIS — M542 Cervicalgia: Secondary | ICD-10-CM | POA: Insufficient documentation

## 2022-07-18 DIAGNOSIS — G8929 Other chronic pain: Secondary | ICD-10-CM | POA: Diagnosis not present

## 2022-07-18 DIAGNOSIS — M25561 Pain in right knee: Secondary | ICD-10-CM | POA: Diagnosis not present

## 2022-07-18 DIAGNOSIS — M25562 Pain in left knee: Secondary | ICD-10-CM | POA: Diagnosis not present

## 2022-07-23 ENCOUNTER — Encounter (HOSPITAL_BASED_OUTPATIENT_CLINIC_OR_DEPARTMENT_OTHER): Payer: Self-pay | Admitting: Physical Therapy

## 2022-07-23 ENCOUNTER — Ambulatory Visit (HOSPITAL_BASED_OUTPATIENT_CLINIC_OR_DEPARTMENT_OTHER): Payer: PPO | Admitting: Physical Therapy

## 2022-07-23 DIAGNOSIS — G8929 Other chronic pain: Secondary | ICD-10-CM

## 2022-07-23 DIAGNOSIS — M542 Cervicalgia: Secondary | ICD-10-CM | POA: Diagnosis not present

## 2022-07-23 NOTE — Therapy (Signed)
OUTPATIENT PHYSICAL THERAPY CERVICAL TREATMENT   Patient Name: Ashley Blair MRN: [id number] DOB:17-May-1956, 66 y.o., female Today's Date: 07/23/2022   PT End of Session - 07/23/22 1139     Visit Number 16    Number of Visits 23    Date for PT Re-Evaluation 09/26/22    Authorization Type HTA    PT Start Time 1139    PT Stop Time 1222    PT Time Calculation (min) 43 min    Activity Tolerance Patient tolerated treatment well    Behavior During Therapy WFL for tasks assessed/performed                     Past Medical History:  Diagnosis Date   Elevated hemoglobin A1c 2019   5.7   Family history of DES exposure    Fibrocystic breast    GERD (gastroesophageal reflux disease)    HPV (human papilloma virus) infection    Hypercholesterolemia    OCD (obsessive compulsive disorder)    S/P excision of lipoma    Past Surgical History:  Procedure Laterality Date   ANKLE SURGERY     broken as a child   DILATION AND CURETTAGE OF UTERUS     LIPOMA EXCISION     rib cage   Patient Active Problem List   Diagnosis Date Noted   Family history of breast cancer in mother 12/12/2013   H/O diethylstilbestrol (DES) exposure in utero 12/12/2013    PCP:  Rankins, Bill Salinas, MD     REFERRING PROVIDER: Simona Huh, NP Gentry Fitz, MD  REFERRING DIAG: M54.2 (ICD-10-CM) - Cervicalgia    Bilateral knee osteoarthritis  THERAPY DIAG:  Cervicalgia  Chronic pain of right knee  Chronic pain of left knee  Rationale for Evaluation and Treatment Rehabilitation  ONSET DATE: 01/2022  SUBJECTIVE:                                                 SUBJECTIVE STATEMENT:  My hip felt better, same areas but less intensity. Neck is still tight. Added the higher heel lift Saturday which made my right leg feel achey. Went back to 2 layers yesterday to compare.   PERTINENT HISTORY:  N/A  PAIN:  Are you having pain? Yes: NPRS scale: moderate- neck/10; "it's fine" at  knee Pain location: L upper trap tightness Pain description: aching, cramping, dull, sharp, spasm Aggravating factors: constant Relieving factors: resting head   PRECAUTIONS: None  WEIGHT BEARING RESTRICTIONS No  FALLS:  Has patient fallen in last 6 months? 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 Mild spinal canal stenosis at C3-4, C4-5, and C6-7. Multilevel neural foraminal narrowing, as described above.    PATIENT SURVEYS:  FOTO 63 66 @ DC  MMT lb Right 10/25 Left 10/25 Rt/Lt 12/20  Hip abduction (SL at kee) 39.2 41.4 38.5//36.5  Knee flexion 32.6 17.9 32.8//29.6 Discomfort Lt flx  Knee extension 40.1 34.4 36.6//40.1   (Blank rows = not tested)  07/18/22 cervical AROM Flexion  34 SB  Rt/Lb 20/28 Rot Rt/Lt 24/20   TODAY'S TREATMENT:   Treatment                            07/23/22:  Trigger Point Dry Needling, Manual Therapy Treatment:  Initial or subsequent education regarding Trigger Point Dry Needling: Subsequent Did patient give consent to treatment with Trigger Point Dry Needling: Yes TPDN with skilled palpation and monitoring followed by STM to the following muscles: Lt piriformis, glut med/min , TFL; bil upper trap, Lt levator, subscap  Prone scapular distraction, scapular mobilizations, bil first rib inf mobs.  Review of stretches she is performing for periscapular region & pec stretch      Treatment                            07/18/22:  Trigger Point Dry Needling, Manual Therapy Treatment:  Initial or subsequent education regarding Trigger Point Dry Needling: Subsequent Did patient give  consent to treatment with Trigger Point Dry Needling: Yes TPDN with skilled palpation and monitoring followed by STM to the following muscles: Lt glut med/min TFL; left upper trap, splenius capitus, Lt c3-5 paraspinals  Manual prone rib mobs Seated figure 4 stretch Discussed OKC hip abd strengthening  11/21  -kneeling hip hinge/squat 2x10 -SL leg press shuttle 56lbs 2x20 -TKE GTB 20x -SL RDL 2x10 -knee extension machine up2 down 1 20lbs 4x6 -bridge on ball with HS curl 3x10   11/15  STM L rec fem and VL; L gastroc soleus  L knee flexion grade IV mob, tibial posterior glide  -leg press calf raise with stretch at end range 3s 20x 25lbs (life fitness machine) -Staggered leg press 40lbs (life fitness machine) 3x5 -LE alignment with squatting/pilates exercise  Trigger Point Dry Needling, Manual Therapy Treatment:  Initial or subsequent education regarding Trigger Point Dry Needling: Subsequent Did patient give consent to treatment with Trigger Point Dry Needling: Yes TPDN with skilled palpation and monitoring followed by STM to the following muscles:  L rec fem/vastus intermedius, L gastroc/soleus    PATIENT EDUCATION:  Education details:  anatomy, inflammation/edema management, exercise progression, exercise modification, acceptable levels of pain, muscle firing, HEP, POC Person educated: Patient Education method: Explanation, Demonstration, Tactile cues, Verbal cues, and Handouts Education comprehension: verbalized understanding, returned demonstration, verbal cues required, tactile cues required, and needs further education     HOME EXERCISE PROGRAM:  Access Code: [email or web address] URL: [email or web address]  ASSESSMENT:   CLINICAL IMPRESSION: Significant twitch response from left levator and subscap resulted in decrease in shoulder hike. Reported feeling fatigue down the UE.    OBJECTIVE IMPAIRMENTS decreased ROM, decreased strength, hypomobility, increased  fascial restrictions, increased muscle spasms, impaired flexibility, improper body mechanics, postural dysfunction, and pain.    ACTIVITY LIMITATIONS carrying, lifting, sitting, stairs   PARTICIPATION LIMITATIONS: driving, shopping, community activity, occupation, and yard work   PERSONAL FACTORS Age, Time since onset of injury/illness/exacerbation, and 1 comorbidity:    are also affecting patient's functional outcome.        GOALS:   SHORT TERM GOALS: Target date: 05/02/2022    Pt will become independent with HEP in order to demonstrate synthesis of PT education.  Goal status: MET   2.  Pt will report at least 2 pt reduction on NPRS scale for pain in order to demonstrate functional improvement with household activity, self care, and ADL.    Goal status: MET   3.  Pt will be able to report abolishment of HA in order to demonstrate functional improvement in C/S function for self-care and house hold duties.    Goal status: MET       LONG TERM GOALS: Target date: POC DATE    Pt  will become independent with final HEP in order to demonstrate synthesis of PT education.    Goal status: ongoing   2.  Pt will be able to demonstrate full C/S ROM without pain in order to demonstrate functional improvement in C/s function for return to PLOF.   Goal status: ongoing   3.  Pt will be able to demonstrate/report ability to sit/stand/sleep for extended periods of time without pain in order to demonstrate functional improvement and tolerance to static positioning.  Goal status: achieved regarding cervical pain  4.  Pt will score >/= 66 on FOTO to demonstrate improvement in perceived C/S function.  Baseline:  Goal status: ongoing  5.  Able to descend stairs without limitation by knee pain Baseline:  Goal status: INITIAL  6.  Demo LE strength 95% of opposite side Baseline:  Goal status: INITIAL    PLAN: PT FREQUENCY: 1-2x/week   PT DURATION: 12 weeks (likely every other week,  likely D/C in 8 wks)   PLANNED INTERVENTIONS: Therapeutic exercises, Therapeutic activity, Neuromuscular re-education, Balance training, Gait training, Patient/Family education, Self Care, Joint mobilization, Joint manipulation, Orthotic/Fit training, Aquatic Therapy, Dry Needling, Electrical stimulation, Spinal manipulation, Spinal mobilization, Cryotherapy, Moist heat, Compression bandaging, scar mobilization, Splintting, Taping, Vasopneumatic device, Traction, Ultrasound, Ionotophoresis 19m/ml Dexamethasone, Manual therapy, and Re-evaluation   PLAN FOR NEXT SESSION: DN PRN to cervical & hip region, hip abd strength testing & review OKC exercises.    Jessica C. Hightower PT, DPT 07/23/22 12:30 PM

## 2022-08-04 DIAGNOSIS — M8580 Other specified disorders of bone density and structure, unspecified site: Secondary | ICD-10-CM

## 2022-08-04 HISTORY — DX: Other specified disorders of bone density and structure, unspecified site: M85.80

## 2022-08-05 ENCOUNTER — Ambulatory Visit (HOSPITAL_BASED_OUTPATIENT_CLINIC_OR_DEPARTMENT_OTHER): Payer: PPO | Attending: Family Medicine | Admitting: Physical Therapy

## 2022-08-05 ENCOUNTER — Encounter (HOSPITAL_BASED_OUTPATIENT_CLINIC_OR_DEPARTMENT_OTHER): Payer: Self-pay | Admitting: Physical Therapy

## 2022-08-05 DIAGNOSIS — M25561 Pain in right knee: Secondary | ICD-10-CM | POA: Diagnosis not present

## 2022-08-05 DIAGNOSIS — M25562 Pain in left knee: Secondary | ICD-10-CM | POA: Insufficient documentation

## 2022-08-05 DIAGNOSIS — M542 Cervicalgia: Secondary | ICD-10-CM | POA: Diagnosis not present

## 2022-08-05 DIAGNOSIS — G8929 Other chronic pain: Secondary | ICD-10-CM | POA: Diagnosis not present

## 2022-08-05 NOTE — Therapy (Signed)
OUTPATIENT PHYSICAL THERAPY CERVICAL TREATMENT   Patient Name: Ashley Blair MRN: [id number] DOB:August 10, 1955, 67 y.o., female Today's Date: 08/05/2022   PT End of Session - 08/05/22 1431     Visit Number 17    Number of Visits 23    Date for PT Re-Evaluation 09/26/22    Authorization Type HTA    PT Start Time 1430    PT Stop Time 1510    PT Time Calculation (min) 40 min    Activity Tolerance Patient tolerated treatment well    Behavior During Therapy WFL for tasks assessed/performed                      Past Medical History:  Diagnosis Date   Elevated hemoglobin A1c 2019   5.7   Family history of DES exposure    Fibrocystic breast    GERD (gastroesophageal reflux disease)    HPV (human papilloma virus) infection    Hypercholesterolemia    OCD (obsessive compulsive disorder)    S/P excision of lipoma    Past Surgical History:  Procedure Laterality Date   ANKLE SURGERY     broken as a child   DILATION AND CURETTAGE OF UTERUS     LIPOMA EXCISION     rib cage   Patient Active Problem List   Diagnosis Date Noted   Family history of breast cancer in mother 12/12/2013   H/O diethylstilbestrol (DES) exposure in utero 12/12/2013    PCP:  Rankins, Bill Salinas, MD     REFERRING PROVIDER: Simona Huh, NP Gentry Fitz, MD  REFERRING DIAG: M54.2 (ICD-10-CM) - Cervicalgia    Bilateral knee osteoarthritis  THERAPY DIAG:  Cervicalgia  Chronic pain of right knee  Rationale for Evaluation and Treatment Rehabilitation  ONSET DATE: 01/2022  SUBJECTIVE:                                                 SUBJECTIVE STATEMENT:  Pt states that the L knee feels much better. She feels she continues to have neck pain on the L side. She states the L hip posteriorly is uncomfortable with pain that wraps around the posterior aspect of the hip and near SIJ.  R heel lift feels better.  PERTINENT HISTORY:  N/A  PAIN:  Are you having pain? Yes: NPRS scale:  moderate- neck/10; "it's fine" at knee Pain location: L upper trap tightness Pain description: aching, cramping, dull, sharp, spasm Aggravating factors: constant Relieving factors: resting head   PRECAUTIONS: None  WEIGHT BEARING RESTRICTIONS No  FALLS:  Has patient fallen in last 6 months? 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 Mild spinal canal stenosis at C3-4, C4-5, and C6-7. Multilevel neural foraminal narrowing, as described above.    PATIENT SURVEYS:  FOTO 63 66 @ DC  MMT lb Right 10/25 Left 10/25 Rt/Lt 12/20  Hip abduction (SL at kee) 39.2 41.4 38.5//36.5  Knee flexion 32.6 17.9 32.8//29.6 Discomfort Lt flx  Knee extension 40.1 34.4 36.6//40.1   (Blank rows = not  tested)  07/18/22 cervical AROM Flexion  34 SB Rt/Lb 20/28 Rot Rt/Lt 24/20   TODAY'S TREATMENT:   Treatment                            08/05/21:  Trigger Point Dry Needling, Manual Therapy Treatment:  Initial or subsequent education regarding Trigger Point Dry Needling: Subsequent Did patient give consent to treatment with Trigger Point Dry Needling: Yes TPDN with skilled palpation and monitoring followed by STM to the following muscles: L UT  Supine UPA and CPA grade III C2-6 Supine C2-6 side glide grade III STM L UT and L glute/hip rotators Active release L hip rotators Sidestepping and standing fire hydrant GTB at ankle     Treatment                            07/23/22:  Trigger Point Dry Needling, Manual Therapy Treatment:  Initial or subsequent education regarding Trigger Point Dry Needling: Subsequent Did patient give consent to  treatment with Trigger Point Dry Needling: Yes TPDN with skilled palpation and monitoring followed by STM to the following muscles: Lt piriformis, glut med/min , TFL; bil upper trap, Lt levator, subscap  Prone scapular distraction, scapular mobilizations, bil first rib inf mobs.  Review of stretches she is performing for periscapular region & pec stretch      Treatment                            07/18/22:  Trigger Point Dry Needling, Manual Therapy Treatment:  Initial or subsequent education regarding Trigger Point Dry Needling: Subsequent Did patient give consent to treatment with Trigger Point Dry Needling: Yes TPDN with skilled palpation and monitoring followed by STM to the following muscles: Lt glut med/min TFL; left upper trap, splenius capitus, Lt c3-5 paraspinals  Manual prone rib mobs Seated figure 4 stretch Discussed OKC hip abd strengthening  11/21  -kneeling hip hinge/squat 2x10 -SL leg press shuttle 56lbs 2x20 -TKE GTB 20x -SL RDL 2x10 -knee extension machine up2 down 1 20lbs 4x6 -bridge on ball with HS curl 3x10   11/15  STM L rec fem and VL; L gastroc soleus  L knee flexion grade IV mob, tibial posterior glide  -leg press calf raise with stretch at end range 3s 20x 25lbs (life fitness machine) -Staggered leg press 40lbs (life fitness machine) 3x5 -LE alignment with squatting/pilates exercise  Trigger Point Dry Needling, Manual Therapy Treatment:  Initial or subsequent education regarding Trigger Point Dry Needling: Subsequent Did patient give consent to treatment with Trigger Point Dry Needling: Yes TPDN with skilled palpation and monitoring followed by STM to the following muscles:  L rec fem/vastus intermedius, L gastroc/soleus    PATIENT EDUCATION:  Education details:  anatomy, inflammation/edema management, exercise progression, exercise modification, acceptable levels of pain, muscle firing, HEP, POC Person educated: Patient Education method:  Explanation, Demonstration, Tactile cues, Verbal cues, and Handouts Education comprehension: verbalized understanding, returned demonstration, verbal cues required, tactile cues required, and needs further education     HOME EXERCISE PROGRAM:  Access Code: [email or web address] URL: [email or web address]  ASSESSMENT:   CLINICAL IMPRESSION: Patient with improvement soft tissue extensibility especially into the left upper trap following manual therapy and dry needling.  Patient with improvement in pain at left hip and neck following session.  Patient able to incorporate left glutes strengthening in CK[facility] without increase  in pain.  Home exercise program verbally updated with new hip strengthening exercise.  Plan to continue with pain management as tolerated.  Plan to decrease frequency and transition to independent management as able.  Patient would benefit from continued skilled therapy in order to address pain that limits functional capabilities and as well as decreasing quality of life.  OBJECTIVE IMPAIRMENTS decreased ROM, decreased strength, hypomobility, increased fascial restrictions, increased muscle spasms, impaired flexibility, improper body mechanics, postural dysfunction, and pain.    ACTIVITY LIMITATIONS carrying, lifting, sitting, stairs   PARTICIPATION LIMITATIONS: driving, shopping, community activity, occupation, and yard work   PERSONAL FACTORS Age, Time since onset of injury/illness/exacerbation, and 1 comorbidity:    are also affecting patient's functional outcome.        GOALS:   SHORT TERM GOALS: Target date: 05/02/2022    Pt will become independent with HEP in order to demonstrate synthesis of PT education.  Goal status: MET   2.  Pt will report at least 2 pt reduction on NPRS scale for pain in order to demonstrate functional improvement with household activity, self care, and ADL.    Goal status: MET   3.  Pt will be able to report abolishment of HA in order to  demonstrate functional improvement in C/S function for self-care and house hold duties.    Goal status: MET       LONG TERM GOALS: Target date: POC DATE    Pt  will become independent with final HEP in order to demonstrate synthesis of PT education.    Goal status: ongoing   2.  Pt will be able to demonstrate full C/S ROM without pain in order to demonstrate functional improvement in C/s function for return to PLOF.   Goal status: ongoing   3.  Pt will be able to demonstrate/report ability to sit/stand/sleep for extended periods of time without pain in order to demonstrate functional improvement and tolerance to static positioning.  Goal status: achieved regarding cervical pain  4.  Pt will score >/= 66 on FOTO to demonstrate improvement in perceived C/S function.  Baseline:  Goal status: ongoing  5.  Able to descend stairs without limitation by knee pain Baseline:  Goal status: INITIAL  6.  Demo LE strength 95% of opposite side Baseline:  Goal status: INITIAL    PLAN: PT FREQUENCY: 1-2x/week   PT DURATION: 12 weeks (likely every other week, likely D/C in 8 wks)   PLANNED INTERVENTIONS: Therapeutic exercises, Therapeutic activity, Neuromuscular re-education, Balance training, Gait training, Patient/Family education, Self Care, Joint mobilization, Joint manipulation, Orthotic/Fit training, Aquatic Therapy, Dry Needling, Electrical stimulation, Spinal manipulation, Spinal mobilization, Cryotherapy, Moist heat, Compression bandaging, scar mobilization, Splintting, Taping, Vasopneumatic device, Traction, Ultrasound, Ionotophoresis 25m/ml Dexamethasone, Manual therapy, and Re-evaluation   PLAN FOR NEXT SESSION: DN PRN to cervical & hip region, hip abd strength testing & review OKC exercises.    ADaleen BoPT, DPT 08/05/22 3:17 PM

## 2022-08-13 ENCOUNTER — Encounter (HOSPITAL_BASED_OUTPATIENT_CLINIC_OR_DEPARTMENT_OTHER): Payer: PPO | Admitting: Physical Therapy

## 2022-08-20 ENCOUNTER — Encounter (HOSPITAL_BASED_OUTPATIENT_CLINIC_OR_DEPARTMENT_OTHER): Payer: PPO | Admitting: Physical Therapy

## 2022-08-27 ENCOUNTER — Ambulatory Visit (HOSPITAL_BASED_OUTPATIENT_CLINIC_OR_DEPARTMENT_OTHER): Payer: PPO | Admitting: Physical Therapy

## 2022-08-27 ENCOUNTER — Encounter (HOSPITAL_BASED_OUTPATIENT_CLINIC_OR_DEPARTMENT_OTHER): Payer: Self-pay | Admitting: Physical Therapy

## 2022-08-27 DIAGNOSIS — G8929 Other chronic pain: Secondary | ICD-10-CM

## 2022-08-27 DIAGNOSIS — M542 Cervicalgia: Secondary | ICD-10-CM | POA: Diagnosis not present

## 2022-08-27 NOTE — Therapy (Signed)
OUTPATIENT PHYSICAL THERAPY CERVICAL TREATMENT   Patient Name: Ashley Blair MRN: [id number] DOB:10-May-1956, 67 y.o., female Today's Date: 08/27/2022   PT End of Session - 08/27/22 1139     Visit Number 18    Number of Visits 23    Date for PT Re-Evaluation 09/26/22    Authorization Type HTA    PT Start Time 1140    PT Stop Time 1225    PT Time Calculation (min) 45 min    Activity Tolerance Patient tolerated treatment well    Behavior During Therapy WFL for tasks assessed/performed                      Past Medical History:  Diagnosis Date   Elevated hemoglobin A1c 2019   5.7   Family history of DES exposure    Fibrocystic breast    GERD (gastroesophageal reflux disease)    HPV (human papilloma virus) infection    Hypercholesterolemia    OCD (obsessive compulsive disorder)    S/P excision of lipoma    Past Surgical History:  Procedure Laterality Date   ANKLE SURGERY     broken as a child   DILATION AND CURETTAGE OF UTERUS     LIPOMA EXCISION     rib cage   Patient Active Problem List   Diagnosis Date Noted   Family history of breast cancer in mother 12/12/2013   H/O diethylstilbestrol (DES) exposure in utero 12/12/2013    PCP:  Rankins, Bill Salinas, MD     REFERRING PROVIDER: Simona Huh, NP Gentry Fitz, MD  REFERRING DIAG: M54.2 (ICD-10-CM) - Cervicalgia    Bilateral knee osteoarthritis  THERAPY DIAG:  Cervicalgia  Chronic pain of right knee  Chronic pain of left knee  Rationale for Evaluation and Treatment Rehabilitation  ONSET DATE: 01/2022  SUBJECTIVE:                                                 SUBJECTIVE STATEMENT:  Rt buttock pain when I bend forward. My hips feel so much better that it almost seems like this is leftover- like it is over stretched or something.   PERTINENT HISTORY:  N/A  PAIN:  Are you having pain? Yes: NPRS scale: moderate- neck/10; "it's fine" at knee Pain location: L upper trap  tightness Pain description: aching, cramping, dull, sharp, spasm Aggravating factors: constant Relieving factors: resting head   PRECAUTIONS: None  WEIGHT BEARING RESTRICTIONS No  FALLS:  Has patient fallen in last 6 months? 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 Mild spinal canal stenosis at C3-4, C4-5, and C6-7. Multilevel neural foraminal narrowing, as described above.    PATIENT SURVEYS:  FOTO 63 66 @ DC  1/24: 63  MMT lb Right 10/25 Left 10/25 Rt/Lt 12/20 Rt/Lt 1/24  Hip abduction (SL at kee) 39.2 41.4 38.5//36.5 37.6/35.0  Knee flexion 32.6 17.9 32.8//29.6 Discomfort Lt flx 30.5/24.7 cont Lt knee discomfort  Knee extension 40.1 34.4 36.6//40.1 31.9/33.8   (Blank rows = not tested)  07/18/22 cervical AROM Flexion  34 SB Rt/Lb 20/28 Rot Rt/Lt 24/20   TODAY'S TREATMENT:   Treatment                            08/27/22:  TA: discussed functional goals, FOTO, hinging vs squatting to avoid snapping irritation TE: sidelying hip clams & abd Standing sumo squat, standing hip abd with posture through standing leg as well Discussed use of community resources for continued strength and stability, DN PRN   Treatment                            08/05/21:  Trigger Point Dry Needling, Manual Therapy Treatment:  Initial or subsequent education regarding Trigger Point Dry Needling: Subsequent Did patient give consent to treatment with Trigger Point Dry Needling: Yes TPDN with skilled palpation and monitoring followed by STM to the following muscles: L UT  Supine UPA and CPA grade III C2-6 Supine C2-6 side glide grade  III STM L UT and L glute/hip rotators Active release L hip rotators Sidestepping and standing fire hydrant GTB at ankle     Treatment                            07/23/22:  Trigger Point Dry Needling, Manual Therapy Treatment:  Initial or subsequent education regarding Trigger Point Dry Needling: Subsequent Did patient give consent to treatment with Trigger Point Dry Needling: Yes TPDN with skilled palpation and monitoring followed by STM to the following muscles: Lt piriformis, glut med/min , TFL; bil upper trap, Lt levator, subscap  Prone scapular distraction, scapular mobilizations, bil first rib inf mobs.  Review of stretches she is performing for periscapular region & pec stretch      PATIENT EDUCATION:  Education details:  anatomy, inflammation/edema management, exercise progression, exercise modification, acceptable levels of pain, muscle firing, HEP, POC Person educated: Patient Education method: Explanation, Demonstration, Tactile cues, Verbal cues, and Handouts Education comprehension: verbalized understanding, returned demonstration, verbal cues required, tactile cues required, and needs further education     HOME EXERCISE PROGRAM:  Access Code: [email or web address] URL: [email or web address]  ASSESSMENT:   CLINICAL IMPRESSION: Pt has made significant improvement in neck reporting "it feels better than it has in a long time." Note she does have extensive arthritis in her cervical spine and will likely require on and off episodic care in order to avoid limitations to functional ADLs or further injury. Improved hip pain with addition of heel lift for alignment and will continue to utilize. She did start doing barre classes through [employer] but thinks she would benefit from hands-on cuing so I recommended she try an intro class at a barre studio. Snapping of what seems to be piriformis on the right side in hip hinge. Form is appropriate and I expect this to decrease as  lumbopelvic stability continues to improve.   OBJECTIVE IMPAIRMENTS decreased ROM, decreased strength, hypomobility, increased fascial restrictions, increased muscle spasms, impaired flexibility, improper body mechanics, postural dysfunction, and pain.    ACTIVITY LIMITATIONS carrying, lifting, sitting, stairs   PARTICIPATION LIMITATIONS: driving, shopping, community activity, occupation, and yard work   PERSONAL FACTORS Age, Time since onset of injury/illness/exacerbation, and 1 comorbidity:    are also affecting patient's functional outcome.        GOALS:   SHORT TERM GOALS: Target date: 05/02/2022    Pt will become  independent with HEP in order to demonstrate synthesis of PT education.  Goal status: MET   2.  Pt will report at least 2 pt reduction on NPRS scale for pain in order to demonstrate functional improvement with household activity, self care, and ADL.    Goal status: MET   3.  Pt will be able to report abolishment of HA in order to demonstrate functional improvement in C/S function for self-care and house hold duties.    Goal status: MET       LONG TERM GOALS: Target date: POC DATE    Pt  will become independent with final HEP in order to demonstrate synthesis of PT education.    Goal status: achieved   2.  Pt will be able to demonstrate full C/S ROM without pain in order to demonstrate functional improvement in C/s function for return to PLOF.   Goal status: achieved for necessary function   3.  Pt will be able to demonstrate/report ability to sit/stand/sleep for extended periods of time without pain in order to demonstrate functional improvement and tolerance to static positioning.  Goal status: achieved regarding cervical pain  4.  Pt will score >/= 66 on FOTO to demonstrate improvement in perceived C/S function.  Baseline:  Goal status: not met  5.  Able to descend stairs without limitation by knee pain Baseline:  Goal status: it's not like I'm dying  but it hurts  6.  Demo LE strength 95% of opposite side Baseline:  Goal status: partially  myet    PLAN: PT FREQUENCY: 1-2x/week   PT DURATION: 12 weeks (likely every other week, likely D/C in 8 wks)   PLANNED INTERVENTIONS: Therapeutic exercises, Therapeutic activity, Neuromuscular re-education, Balance training, Gait training, Patient/Family education, Self Care, Joint mobilization, Joint manipulation, Orthotic/Fit training, Aquatic Therapy, Dry Needling, Electrical stimulation, Spinal manipulation, Spinal mobilization, Cryotherapy, Moist heat, Compression bandaging, scar mobilization, Splintting, Taping, Vasopneumatic device, Traction, Ultrasound, Ionotophoresis '4mg'$ /ml Dexamethasone, Manual therapy, and Re-evaluation   PLAN FOR NEXT SESSION: DN PRN to cervical & hip region, hip abd strength testing & review [place] exercises.    Pamala Hayman C. Crisol Muecke PT, DPT 08/27/22 1:21 PM

## 2022-09-03 ENCOUNTER — Encounter (HOSPITAL_BASED_OUTPATIENT_CLINIC_OR_DEPARTMENT_OTHER): Payer: PPO | Admitting: Physical Therapy

## 2022-09-05 ENCOUNTER — Ambulatory Visit (HOSPITAL_BASED_OUTPATIENT_CLINIC_OR_DEPARTMENT_OTHER): Payer: PPO | Admitting: Physical Therapy

## 2022-09-10 ENCOUNTER — Encounter (HOSPITAL_BASED_OUTPATIENT_CLINIC_OR_DEPARTMENT_OTHER): Payer: PPO | Admitting: Physical Therapy

## 2022-09-17 ENCOUNTER — Encounter (HOSPITAL_BASED_OUTPATIENT_CLINIC_OR_DEPARTMENT_OTHER): Payer: PPO | Admitting: Physical Therapy

## 2022-09-18 DIAGNOSIS — F419 Anxiety disorder, unspecified: Secondary | ICD-10-CM | POA: Diagnosis not present

## 2022-09-18 DIAGNOSIS — M542 Cervicalgia: Secondary | ICD-10-CM | POA: Diagnosis not present

## 2022-09-18 DIAGNOSIS — E7800 Pure hypercholesterolemia, unspecified: Secondary | ICD-10-CM | POA: Diagnosis not present

## 2022-09-18 DIAGNOSIS — R7303 Prediabetes: Secondary | ICD-10-CM | POA: Diagnosis not present

## 2022-09-18 DIAGNOSIS — Z79899 Other long term (current) drug therapy: Secondary | ICD-10-CM | POA: Diagnosis not present

## 2022-09-18 DIAGNOSIS — K219 Gastro-esophageal reflux disease without esophagitis: Secondary | ICD-10-CM | POA: Diagnosis not present

## 2022-09-24 ENCOUNTER — Encounter (HOSPITAL_BASED_OUTPATIENT_CLINIC_OR_DEPARTMENT_OTHER): Payer: PPO | Admitting: Physical Therapy

## 2022-10-01 ENCOUNTER — Encounter (HOSPITAL_BASED_OUTPATIENT_CLINIC_OR_DEPARTMENT_OTHER): Payer: PPO | Admitting: Physical Therapy

## 2022-10-20 DIAGNOSIS — D2262 Melanocytic nevi of left upper limb, including shoulder: Secondary | ICD-10-CM | POA: Diagnosis not present

## 2022-10-20 DIAGNOSIS — D2271 Melanocytic nevi of right lower limb, including hip: Secondary | ICD-10-CM | POA: Diagnosis not present

## 2022-10-20 DIAGNOSIS — L2084 Intrinsic (allergic) eczema: Secondary | ICD-10-CM | POA: Diagnosis not present

## 2022-10-20 DIAGNOSIS — L821 Other seborrheic keratosis: Secondary | ICD-10-CM | POA: Diagnosis not present

## 2022-10-20 DIAGNOSIS — D2272 Melanocytic nevi of left lower limb, including hip: Secondary | ICD-10-CM | POA: Diagnosis not present

## 2022-10-20 DIAGNOSIS — L578 Other skin changes due to chronic exposure to nonionizing radiation: Secondary | ICD-10-CM | POA: Diagnosis not present

## 2022-10-20 DIAGNOSIS — D225 Melanocytic nevi of trunk: Secondary | ICD-10-CM | POA: Diagnosis not present

## 2022-10-20 DIAGNOSIS — L814 Other melanin hyperpigmentation: Secondary | ICD-10-CM | POA: Diagnosis not present

## 2022-10-29 DIAGNOSIS — M542 Cervicalgia: Secondary | ICD-10-CM | POA: Diagnosis not present

## 2022-10-29 DIAGNOSIS — M47816 Spondylosis without myelopathy or radiculopathy, lumbar region: Secondary | ICD-10-CM | POA: Diagnosis not present

## 2022-10-29 DIAGNOSIS — M4802 Spinal stenosis, cervical region: Secondary | ICD-10-CM | POA: Diagnosis not present

## 2022-12-24 ENCOUNTER — Ambulatory Visit (INDEPENDENT_AMBULATORY_CARE_PROVIDER_SITE_OTHER): Payer: HMO | Admitting: Orthopaedic Surgery

## 2022-12-24 ENCOUNTER — Other Ambulatory Visit (INDEPENDENT_AMBULATORY_CARE_PROVIDER_SITE_OTHER): Payer: HMO

## 2022-12-24 DIAGNOSIS — M25562 Pain in left knee: Secondary | ICD-10-CM

## 2022-12-24 DIAGNOSIS — M1712 Unilateral primary osteoarthritis, left knee: Secondary | ICD-10-CM | POA: Diagnosis not present

## 2022-12-24 NOTE — Progress Notes (Signed)
Office Visit Note   Patient: Ashley Blair           Date of Birth: [birth year]           MRN: [id number] Visit Date: 12/24/2022              Requested by: Thana Ates, MD [address] [place],  [place] [address] PCP: Thana Ates, MD   Assessment & Plan: Visit Diagnoses:  1. Primary osteoarthritis of left knee     Plan: Impression is 67 year old female with end-stage left knee DJD.  Impression is severe left knee degenerative joint disease secondary to Osteoarthritis.  Bone on bone joint space narrowing is seen on radiographs with neutral alignment.  At this point, conservative treatments fail to provide any significant relief and the pain is severely affecting ADLs and quality of life.  Based on treatment options, the patient has elected to move forward with a knee replacement.  We have discussed the surgical risks that include but are not limited to infection, DVT, leg length discrepancy, stiffness, numbness, tingling, incomplete relief of pain.  Recovery and prognosis were also reviewed.    At this point I will have Ashley Blair call the patient to confirm surgery time.  Will also get necessary presurgical clearance from her primary care doctor.  Anticoagulants: No antithrombotic Postop anticoagulation: Aspirin 81 mg Diabetic: No  Nickel allergy: No Prior DVT/PE: No Tobacco use: No Clearances needed for surgery: Hillard Danker - PCP Anticipated discharge dispo: Home   Follow-Up Instructions: No follow-ups on file.   Orders:  Orders Placed This Encounter  Procedures   XR KNEE 3 VIEW LEFT   No orders of the defined types were placed in this encounter.     Procedures: No procedures performed   Clinical Data: No additional findings.   Subjective: Chief Complaint  Patient presents with   Left Knee - Pain    HPI Ashley Blair is very pleasant 67 year old female who is actually my neighbor who comes in for evaluation of chronic left knee pain that has  progressively gotten worse since 2017.  She has severe pain with using steps and with sit to stand transition.  She has had multiple rounds of cortisone and Visco injections.  Cortisone injections provided no relief and Visco injections provide temporary relief.  She feels very unsteady on her feet and has constant sensation of giving way.  Denies any recent injuries.  She did have an MRI back in 2017 that showed moderately advanced chondromalacia of the patella and mild chondromalacia of the femoral-tibial compartments. Review of Systems  Constitutional: Negative.   HENT: Negative.    Eyes: Negative.   Respiratory: Negative.    Cardiovascular: Negative.   Endocrine: Negative.   Musculoskeletal: Negative.   Neurological: Negative.   Hematological: Negative.   Psychiatric/Behavioral: Negative.    All other systems reviewed and are negative.    Objective: Vital Signs: LMP 08/04/2002 (Approximate)   Physical Exam Vitals and nursing note reviewed.  Constitutional:      Appearance: She is well-developed.  HENT:     Head: Atraumatic.     Nose: Nose normal.  Eyes:     Extraocular Movements: Extraocular movements intact.  Cardiovascular:     Pulses: Normal pulses.  Pulmonary:     Effort: Pulmonary effort is normal.  Abdominal:     Palpations: Abdomen is soft.  Musculoskeletal:     Cervical back: Neck supple.  Skin:    General: Skin is warm.  Capillary Refill: Capillary refill takes less than 2 seconds.  Neurological:     Mental Status: She is alert. Mental status is at baseline.  Psychiatric:        Behavior: Behavior normal.        Thought Content: Thought content normal.        Judgment: Judgment normal.     Ortho Exam Examination of the left knee shows no joint effusion.  She has a very slight flexion contracture of a few degrees.  1+ patellofemoral crepitus with range of motion.  Collaterals and cruciates are stable.  Slight joint line tenderness. Specialty Comments:   No specialty comments available.  Imaging: XR KNEE 3 VIEW LEFT  Result Date: 12/24/2022 3 view xrays show advanced bone on bone tricompartmental osteoarthritis without significant deformity.    PMFS History: Patient Active Problem List   Diagnosis Date Noted   Family history of breast cancer in mother 12/12/2013   H/O diethylstilbestrol (DES) exposure in utero 12/12/2013   Past Medical History:  Diagnosis Date   Elevated hemoglobin A1c 2019   5.7   Family history of DES exposure    Fibrocystic breast    GERD (gastroesophageal reflux disease)    HPV (human papilloma virus) infection    Hypercholesterolemia    OCD (obsessive compulsive disorder)    S/P excision of lipoma     Family History  Problem Relation Age of Onset   Breast cancer Mother    Osteoporosis Maternal Grandmother    Stroke Maternal Grandfather     Past Surgical History:  Procedure Laterality Date   ANKLE SURGERY     broken as a child   DILATION AND CURETTAGE OF UTERUS     LIPOMA EXCISION     rib cage   Social History   Occupational History   Not on file  Tobacco Use   Smoking status: Former    Types: Cigarettes    Quit date: 12/07/1975    Years since quitting: 47.0   Smokeless tobacco: Never  Vaping Use   Vaping Use: Never used  Substance and Sexual Activity   Alcohol use: Yes    Alcohol/week: 1.0 standard drink of alcohol    Types: 1 Standard drinks or equivalent per week    Comment: occ glass of wine--probably once/month   Drug use: No   Sexual activity: Not Currently    Partners: Male    Birth control/protection: Post-menopausal

## 2022-12-30 ENCOUNTER — Other Ambulatory Visit: Payer: Self-pay | Admitting: Physician Assistant

## 2023-01-21 ENCOUNTER — Encounter (HOSPITAL_COMMUNITY): Payer: Self-pay

## 2023-01-21 NOTE — Pre-Procedure Instructions (Signed)
Surgical Instructions    Your procedure is scheduled on February 02, 2023.  Report to [facility] Main Entrance "A" at 5:30 A.M., then check in with the Admitting office.  Call this number if you have problems the morning of surgery:  [phone]  If you have any questions prior to your surgery date call [phone]: Open Monday-Friday 8am-4pm If you experience any cold or flu symptoms such as cough, fever, chills, shortness of breath, etc. between now and your scheduled surgery, please notify us at the above number.     Remember:  Do not eat after midnight the night before your surgery  You may drink clear liquids until 4:30 AM the morning of your surgery.   Clear liquids allowed are: Water, Non-Citrus Juices (without pulp), Carbonated Beverages, Clear Tea, Black Coffee Only (NO MILK, CREAM OR POWDERED CREAMER of any kind), and Gatorade.  Patient Instructions  The night before surgery:  No food after midnight. ONLY clear liquids after midnight  The day of surgery (if you do NOT have diabetes):  Drink ONE (1) Pre-Surgery Clear Ensure by 4:30 AM the morning of surgery. Drink in one sitting. Do not sip.  This drink was given to you during your hospital  pre-op appointment visit.  Nothing else to drink after completing the  Pre-Surgery Clear Ensure.         If you have questions, please contact your surgeon's office.      Take these medicines the morning of surgery with A SIP OF WATER:  escitalopram (LEXAPRO)   DEXILANT     As of today, STOP taking any Aspirin (unless otherwise instructed by your surgeon) Aleve, Naproxen, Ibuprofen, Motrin, Advil, Goody's, BC's, all herbal medications, fish oil, and all vitamins.                     Do NOT Smoke (Tobacco/Vaping) for 24 hours prior to your procedure.  If you use a CPAP at night, you may bring your mask/headgear for your overnight stay.   Contacts, glasses, piercing's, hearing aid's, dentures or partials may not be worn into  surgery, please bring cases for these belongings.    For patients admitted to the hospital, discharge time will be determined by your treatment team.   Patients discharged the day of surgery will not be allowed to drive home, and someone needs to stay with them for 24 hours.  SURGICAL WAITING ROOM VISITATION Patients having surgery or a procedure may have no more than 2 support people in the waiting area - these visitors may rotate.   Children under the age of 27 must have an adult with them who is not the patient. If the patient needs to stay at the hospital during part of their recovery, the visitor guidelines for inpatient rooms apply. Pre-op nurse will coordinate an appropriate time for 1 support person to accompany patient in pre-op.  This support person may not rotate.   Please refer to the [facility] website for the visitor guidelines for Inpatients (after your surgery is over and you are in a regular room).   If you received a COVID test during your pre-op visit  it is requested that you wear a mask when out in public, stay away from anyone that may not be feeling well and notify your surgeon if you develop symptoms. If you have been in contact with anyone that has tested positive in the last 10 days please notify you surgeon.     Pre-operative 5 CHG  Bath Instructions   You can play a key role in reducing the risk of infection after surgery. Your skin needs to be as free of germs as possible. You can reduce the number of germs on your skin by washing with CHG (chlorhexidine gluconate) soap before surgery. CHG is an antiseptic soap that kills germs and continues to kill germs even after washing.   DO NOT use if you have an allergy to chlorhexidine/CHG or antibacterial soaps. If your skin becomes reddened or irritated, stop using the CHG and notify one of our RNs at [phone].   Please shower with the CHG soap starting 4 days before surgery using the following schedule:      Please keep in mind the following:  DO NOT shave, including legs and underarms, starting the day of your first shower.   You may shave your face at any point before/day of surgery.  Place clean sheets on your bed the day you start using CHG soap. Use a clean washcloth (not used since being washed) for each shower. DO NOT sleep with pets once you start using the CHG.   CHG Shower Instructions:  If you choose to wash your hair and private area, wash first with your normal shampoo/soap.  After you use shampoo/soap, rinse your hair and body thoroughly to remove shampoo/soap residue.  Turn the water OFF and apply about 3 tablespoons (45 ml) of CHG soap to a CLEAN washcloth.  Apply CHG soap ONLY FROM YOUR NECK DOWN TO YOUR TOES (washing for 3-5 minutes)  DO NOT use CHG soap on face, private areas, open wounds, or sores.  Pay special attention to the area where your surgery is being performed.  If you are having back surgery, having someone wash your back for you may be helpful. Wait 2 minutes after CHG soap is applied, then you may rinse off the CHG soap.  Pat dry with a clean towel  Put on clean clothes/pajamas   If you choose to wear lotion, please use ONLY the CHG-compatible lotions on the back of this paper.     Additional instructions for the day of surgery: DO NOT APPLY any lotions, deodorants, cologne, or perfumes.   Do not wear jewelry or makeup Do not wear nail polish, gel polish, artificial nails, or any other type of covering on natural nails (fingers and toes) Do not bring valuables to the hospital. [facility] is not responsible for any belongings or valuables. Put on clean/comfortable clothes.  Brush your teeth.  Ask your nurse before applying any prescription medications to the skin.      CHG Compatible Lotions   Aveeno Moisturizing lotion  Cetaphil Moisturizing Cream  Cetaphil Moisturizing Lotion  Clairol Herbal Essence Moisturizing Lotion, Dry Skin  Clairol  Herbal Essence Moisturizing Lotion, Extra Dry Skin  Clairol Herbal Essence Moisturizing Lotion, Normal Skin  Curel Age Defying Therapeutic Moisturizing Lotion with Alpha Hydroxy  Curel Extreme Care Body Lotion  Curel Soothing Hands Moisturizing Hand Lotion  Curel Therapeutic Moisturizing Cream, Fragrance-Free  Curel Therapeutic Moisturizing Lotion, Fragrance-Free  Curel Therapeutic Moisturizing Lotion, Original Formula  Eucerin Daily Replenishing Lotion  Eucerin Dry Skin Therapy Plus Alpha Hydroxy Crme  Eucerin Dry Skin Therapy Plus Alpha Hydroxy Lotion  Eucerin Original Crme  Eucerin Original Lotion  Eucerin Plus Crme Eucerin Plus Lotion  Eucerin TriLipid Replenishing Lotion  Keri Anti-Bacterial Hand Lotion  Keri Deep Conditioning Original Lotion Dry Skin Formula Softly Scented  Keri Deep Conditioning Original Lotion, Fragrance Free Sensitive Skin Formula  Keri Lotion Fast [employer] Fragrance Free Sensitive Skin Formula  Keri Lotion Fast Absorbing Softly Scented Dry Skin Formula  Keri Original Lotion  Keri Skin Renewal Lotion Keri Silky Smooth Lotion  Keri Silky Smooth Sensitive Skin Lotion  Nivea Body Creamy Conditioning Oil  Nivea Body Extra Enriched Lotion  Nivea Body Original Lotion  Nivea Body Sheer Moisturizing Lotion Nivea Crme  Nivea Skin Firming Lotion  NutraDerm 30 Skin Lotion  NutraDerm Skin Lotion  NutraDerm Therapeutic Skin Cream  NutraDerm Therapeutic Skin Lotion  ProShield Protective Hand Cream  Provon moisturizing lotion   Please read over the following fact sheets that you were given.

## 2023-01-22 ENCOUNTER — Encounter (HOSPITAL_COMMUNITY): Payer: Self-pay

## 2023-01-22 ENCOUNTER — Encounter (HOSPITAL_COMMUNITY)
Admission: RE | Admit: 2023-01-22 | Discharge: 2023-01-22 | Disposition: A | Discharge status: Home / Self Care | Payer: HMO | Source: Ambulatory Visit | Attending: Orthopaedic Surgery | Admitting: Orthopaedic Surgery

## 2023-01-22 ENCOUNTER — Other Ambulatory Visit: Payer: Self-pay

## 2023-01-22 VITALS — BP 131/77 | HR 55 | Temp 98.1°F | Resp 18 | Ht 63.0 in | Wt 144.9 lb

## 2023-01-22 DIAGNOSIS — M1712 Unilateral primary osteoarthritis, left knee: Secondary | ICD-10-CM | POA: Diagnosis not present

## 2023-01-22 DIAGNOSIS — Z01818 Encounter for other preprocedural examination: Secondary | ICD-10-CM | POA: Insufficient documentation

## 2023-01-22 HISTORY — DX: Anxiety disorder, unspecified: F41.9

## 2023-01-22 HISTORY — DX: Prediabetes: R73.03

## 2023-01-22 HISTORY — DX: Unspecified osteoarthritis, unspecified site: M19.90

## 2023-01-22 LAB — CBC
HCT: 43.3 % (ref 36.0–46.0)
Hemoglobin: 14.3 g/dL (ref 12.0–15.0)
MCH: 32.3 pg (ref 26.0–34.0)
MCHC: 33 g/dL (ref 30.0–36.0)
MCV: 97.7 fL (ref 80.0–100.0)
Platelets: 302 10*3/uL (ref 150–400)
RBC: 4.43 MIL/uL (ref 3.87–5.11)
RDW: 12.2 % (ref 11.5–15.5)
WBC: 7.3 10*3/uL (ref 4.0–10.5)
nRBC: 0 % (ref 0.0–0.2)

## 2023-01-22 LAB — BASIC METABOLIC PANEL
Anion gap: 10 (ref 5–15)
BUN: 14 mg/dL (ref 8–23)
CO2: 25 mmol/L (ref 22–32)
Calcium: 9.1 mg/dL (ref 8.9–10.3)
Chloride: 102 mmol/L (ref 98–111)
Creatinine, Ser: 0.79 mg/dL (ref 0.44–1.00)
GFR, Estimated: >60 mL/min (ref >60)
Glucose, Bld: 90 mg/dL (ref 70–99)
Potassium: 3.8 mmol/L (ref 3.5–5.1)
Sodium: 137 mmol/L (ref 135–145)

## 2023-01-22 LAB — SURGICAL PCR SCREEN
MRSA, PCR: NEGATIVE
Staphylococcus aureus: POSITIVE — AB

## 2023-01-22 NOTE — Progress Notes (Signed)
PCP - Dr. Dorris Singh. Raju Cardiologist - Denies  PPM/ICD - Denies Device Orders - n/a Rep Notified - n/a  Chest x-ray - Denies EKG - Many years ago - Normal  Stress Test - Denies ECHO - Denies Cardiac Cath - Denies CT Coronary - 10/02/2021  Sleep Study - Denies CPAP - n/a  Pt is Pre-DM  Last dose of GLP1 agonist- n/a GLP1 instructions: n/a  Blood Thinner Instructions: n/a Aspirin Instructions: n/a  ERAS Protcol - Clear liquids until 0430 morning of surgery PRE-SURGERY Ensure or G2- Ensure given to pt with instructions  COVID TEST- n/a   Anesthesia review: No.   Patient denies shortness of breath, fever, cough and chest pain at PAT appointment. Pt denies any respiratory illness/infection in the last two months.   All instructions explained to the patient, with a verbal understanding of the material. Patient agrees to go over the instructions while at home for a better understanding. Patient also instructed to self quarantine after being tested for COVID-19. The opportunity to ask questions was provided.

## 2023-01-26 ENCOUNTER — Other Ambulatory Visit: Payer: Self-pay | Admitting: Physician Assistant

## 2023-01-26 MED ORDER — METHOCARBAMOL 750 MG PO TABS: 750.0000 mg | ORAL_TABLET | Freq: Two times a day (BID) | ORAL | 2 refills | Status: DC | PRN

## 2023-01-26 MED ORDER — DOCUSATE SODIUM 100 MG PO CAPS: 100.0000 mg | ORAL_CAPSULE | Freq: Every day | ORAL | 2 refills | Status: DC | PRN

## 2023-01-26 MED ORDER — ONDANSETRON HCL 4 MG PO TABS: 4.0000 mg | ORAL_TABLET | Freq: Three times a day (TID) | ORAL | 0 refills | Status: DC | PRN

## 2023-01-26 MED ORDER — RIVAROXABAN 10 MG PO TABS
10.0000 mg | ORAL_TABLET | Freq: Every day | ORAL | 0 refills | Status: DC
Start: 2023-01-26 — End: 2024-05-26

## 2023-01-26 MED ORDER — OXYCODONE-ACETAMINOPHEN 5-325 MG PO TABS: 1.0000 | ORAL_TABLET | Freq: Four times a day (QID) | ORAL | 0 refills | Status: DC | PRN

## 2023-01-29 ENCOUNTER — Other Ambulatory Visit: Payer: Self-pay | Admitting: *Deleted

## 2023-01-29 DIAGNOSIS — M1712 Unilateral primary osteoarthritis, left knee: Secondary | ICD-10-CM

## 2023-01-30 MED ORDER — TRANEXAMIC ACID 1000 MG/10ML IV SOLN
2000.0000 mg | INTRAVENOUS | Status: DC
  Filled 2023-01-30: qty 20

## 2023-02-01 DIAGNOSIS — M1712 Unilateral primary osteoarthritis, left knee: Secondary | ICD-10-CM | POA: Insufficient documentation

## 2023-02-01 NOTE — Anesthesia Preprocedure Evaluation (Addendum)
Anesthesia Evaluation  Patient identified by MRN, date of birth, ID band Patient awake    Reviewed: Allergy & Precautions, H&P , NPO status , Patient's Chart, lab work & pertinent test results  Airway Mallampati: II  TM Distance: >3 FB Neck ROM: Full    Dental no notable dental hx. (+) Teeth Intact, Caps, Dental Advisory Given   Pulmonary neg pulmonary ROS, former smoker   Pulmonary exam normal breath sounds clear to auscultation       Cardiovascular Exercise Tolerance: Good negative cardio ROS Normal cardiovascular exam Rhythm:Regular Rate:Normal     Neuro/Psych  PSYCHIATRIC DISORDERS Anxiety     negative neurological ROS  negative psych ROS   GI/Hepatic negative GI ROS, Neg liver ROS,GERD  Medicated,,  Endo/Other  negative endocrine ROS    Renal/GU negative Renal ROS  negative genitourinary   Musculoskeletal negative musculoskeletal ROS (+) Arthritis , Osteoarthritis,    Abdominal   Peds negative pediatric ROS (+)  Hematology negative hematology ROS (+)   Anesthesia Other Findings   Reproductive/Obstetrics negative OB ROS                             Anesthesia Physical Anesthesia Plan  ASA: 2  Anesthesia Plan: MAC, Regional and Spinal   Post-op Pain Management: Regional block* and Minimal or no pain anticipated   Induction: Intravenous  PONV Risk Score and Plan: Propofol infusion and Treatment may vary due to age or medical condition  Airway Management Planned: Natural Airway and Simple Face Mask  Additional Equipment: None  Intra-op Plan:   Post-operative Plan:   Informed Consent: I have reviewed the patients History and Physical, chart, labs and discussed the procedure including the risks, benefits and alternatives for the proposed anesthesia with the patient or authorized representative who has indicated his/her understanding and acceptance.       Plan Discussed  with: Anesthesiologist and CRNA  Anesthesia Plan Comments:        Anesthesia Quick Evaluation

## 2023-02-02 ENCOUNTER — Encounter (HOSPITAL_COMMUNITY)
Admission: RE | Disposition: A | Discharge status: Home / Self Care | Payer: Self-pay | Source: Home / Self Care | Attending: Orthopaedic Surgery

## 2023-02-02 ENCOUNTER — Ambulatory Visit (HOSPITAL_COMMUNITY): Payer: HMO | Admitting: Anesthesiology

## 2023-02-02 ENCOUNTER — Other Ambulatory Visit: Payer: Self-pay

## 2023-02-02 ENCOUNTER — Ambulatory Visit (HOSPITAL_BASED_OUTPATIENT_CLINIC_OR_DEPARTMENT_OTHER): Payer: HMO | Admitting: Anesthesiology

## 2023-02-02 ENCOUNTER — Observation Stay (HOSPITAL_COMMUNITY)
Admission: RE | Admit: 2023-02-02 | Discharge: 2023-02-03 | Disposition: A | Discharge status: Home / Self Care | Payer: HMO | Attending: Orthopaedic Surgery | Admitting: Orthopaedic Surgery

## 2023-02-02 ENCOUNTER — Observation Stay (HOSPITAL_COMMUNITY): Payer: HMO

## 2023-02-02 ENCOUNTER — Encounter (HOSPITAL_COMMUNITY): Payer: Self-pay | Admitting: Orthopaedic Surgery

## 2023-02-02 DIAGNOSIS — M25462 Effusion, left knee: Secondary | ICD-10-CM | POA: Diagnosis not present

## 2023-02-02 DIAGNOSIS — Z87891 Personal history of nicotine dependence: Secondary | ICD-10-CM | POA: Insufficient documentation

## 2023-02-02 DIAGNOSIS — Z79899 Other long term (current) drug therapy: Secondary | ICD-10-CM | POA: Insufficient documentation

## 2023-02-02 DIAGNOSIS — M1712 Unilateral primary osteoarthritis, left knee: Secondary | ICD-10-CM | POA: Diagnosis not present

## 2023-02-02 DIAGNOSIS — Z96652 Presence of left artificial knee joint: Secondary | ICD-10-CM

## 2023-02-02 DIAGNOSIS — G8918 Other acute postprocedural pain: Secondary | ICD-10-CM | POA: Diagnosis not present

## 2023-02-02 HISTORY — PX: TOTAL KNEE ARTHROPLASTY: SHX125

## 2023-02-02 SURGERY — ARTHROPLASTY, KNEE, TOTAL
Anesthesia: Monitor Anesthesia Care | Site: Knee | Laterality: Left

## 2023-02-02 MED ORDER — OXYCODONE HCL 5 MG PO TABS
5.0000 mg | ORAL_TABLET | ORAL | Status: DC | PRN
  Administered 2023-02-02: 5 mg via ORAL
  Filled 2023-02-02 (×2): qty 2
  Filled 2023-02-02: qty 1

## 2023-02-02 MED ORDER — PROPOFOL 1000 MG/100ML IV EMUL
INTRAVENOUS | Status: AC
  Filled 2023-02-02: qty 100

## 2023-02-02 MED ORDER — RIVAROXABAN 10 MG PO TABS
10.0000 mg | ORAL_TABLET | Freq: Every day | ORAL | Status: DC
  Administered 2023-02-03: 10 mg via ORAL
  Filled 2023-02-02: qty 1

## 2023-02-02 MED ORDER — METHOCARBAMOL 500 MG PO TABS
500.0000 mg | ORAL_TABLET | Freq: Four times a day (QID) | ORAL | Status: DC | PRN
  Administered 2023-02-02 (×2): 500 mg via ORAL
  Filled 2023-02-02 (×3): qty 1

## 2023-02-02 MED ORDER — FENTANYL CITRATE (PF) 250 MCG/5ML IJ SOLN
INTRAMUSCULAR | Status: DC | PRN
  Administered 2023-02-02: 50 ug via INTRAVENOUS

## 2023-02-02 MED ORDER — ACETAMINOPHEN 500 MG PO TABS
1000.0000 mg | ORAL_TABLET | Freq: Four times a day (QID) | ORAL | Status: AC
  Administered 2023-02-02 – 2023-02-03 (×4): 1000 mg via ORAL
  Filled 2023-02-02 (×4): qty 2

## 2023-02-02 MED ORDER — CEFAZOLIN SODIUM-DEXTROSE 2-4 GM/100ML-% IV SOLN
2.0000 g | INTRAVENOUS | Status: AC
  Administered 2023-02-02: 2 g via INTRAVENOUS
  Filled 2023-02-02: qty 100

## 2023-02-02 MED ORDER — PROPOFOL 10 MG/ML IV BOLUS
INTRAVENOUS | Status: AC
  Filled 2023-02-02: qty 20

## 2023-02-02 MED ORDER — TRANEXAMIC ACID-NACL 1000-0.7 MG/100ML-% IV SOLN
1000.0000 mg | Freq: Once | INTRAVENOUS | Status: AC
  Administered 2023-02-02: 1000 mg via INTRAVENOUS
  Filled 2023-02-02: qty 100

## 2023-02-02 MED ORDER — MIDAZOLAM HCL 2 MG/2ML IJ SOLN
INTRAMUSCULAR | Status: AC
  Filled 2023-02-02: qty 2

## 2023-02-02 MED ORDER — ESCITALOPRAM OXALATE 10 MG PO TABS
10.0000 mg | ORAL_TABLET | Freq: Every day | ORAL | Status: DC
  Administered 2023-02-02: 10 mg via ORAL
  Filled 2023-02-02 (×2): qty 1

## 2023-02-02 MED ORDER — SODIUM CHLORIDE 0.9 % IR SOLN
Status: DC | PRN
  Administered 2023-02-02: 1000 mL

## 2023-02-02 MED ORDER — METHOCARBAMOL 1000 MG/10ML IJ SOLN: 500.0000 mg | Freq: Four times a day (QID) | INTRAVENOUS | Status: DC | PRN

## 2023-02-02 MED ORDER — ONDANSETRON HCL 4 MG/2ML IJ SOLN: 4.0000 mg | Freq: Once | INTRAMUSCULAR | Status: DC | PRN

## 2023-02-02 MED ORDER — CEFAZOLIN SODIUM-DEXTROSE 2-4 GM/100ML-% IV SOLN
2.0000 g | Freq: Four times a day (QID) | INTRAVENOUS | Status: AC
  Administered 2023-02-02 (×2): 2 g via INTRAVENOUS
  Filled 2023-02-02 (×2): qty 100

## 2023-02-02 MED ORDER — ASPIRIN 81 MG PO CHEW
81.0000 mg | CHEWABLE_TABLET | Freq: Once | ORAL | Status: DC
  Filled 2023-02-02: qty 1

## 2023-02-02 MED ORDER — 0.9 % SODIUM CHLORIDE (POUR BTL) OPTIME
TOPICAL | Status: DC | PRN
  Administered 2023-02-02: 1000 mL

## 2023-02-02 MED ORDER — VANCOMYCIN HCL 1000 MG IV SOLR
INTRAVENOUS | Status: DC | PRN
  Administered 2023-02-02: 1000 mg

## 2023-02-02 MED ORDER — OXYCODONE HCL 5 MG PO TABS
10.0000 mg | ORAL_TABLET | ORAL | Status: DC | PRN
  Administered 2023-02-03: 10 mg via ORAL
  Filled 2023-02-02: qty 2

## 2023-02-02 MED ORDER — TRANEXAMIC ACID 1000 MG/10ML IV SOLN
INTRAVENOUS | Status: DC | PRN
  Administered 2023-02-02: 2000 mg via TOPICAL

## 2023-02-02 MED ORDER — POVIDONE-IODINE 10 % EX SWAB
2.0000 | Freq: Once | CUTANEOUS | Status: AC
  Administered 2023-02-02: 2 via TOPICAL

## 2023-02-02 MED ORDER — PHENOL 1.4 % MT LIQD: 1.0000 | OROMUCOSAL | Status: DC | PRN

## 2023-02-02 MED ORDER — HYDROMORPHONE HCL 1 MG/ML IJ SOLN: 0.5000 mg | INTRAMUSCULAR | Status: DC | PRN

## 2023-02-02 MED ORDER — MEPERIDINE HCL 25 MG/ML IJ SOLN: 6.2500 mg | INTRAMUSCULAR | Status: DC | PRN

## 2023-02-02 MED ORDER — BUPIVACAINE-MELOXICAM ER 400-12 MG/14ML IJ SOLN
INTRAMUSCULAR | Status: AC
  Filled 2023-02-02: qty 1

## 2023-02-02 MED ORDER — PHENYLEPHRINE HCL-NACL 20-0.9 MG/250ML-% IV SOLN
INTRAVENOUS | Status: DC | PRN
  Administered 2023-02-02: 40 ug/min via INTRAVENOUS

## 2023-02-02 MED ORDER — DOCUSATE SODIUM 100 MG PO CAPS
100.0000 mg | ORAL_CAPSULE | Freq: Two times a day (BID) | ORAL | Status: DC
  Administered 2023-02-02 – 2023-02-03 (×3): 100 mg via ORAL
  Filled 2023-02-02 (×3): qty 1

## 2023-02-02 MED ORDER — ORAL CARE MOUTH RINSE: 15.0000 mL | Freq: Once | OROMUCOSAL | Status: AC

## 2023-02-02 MED ORDER — LACTATED RINGERS IV SOLN: INTRAVENOUS | Status: DC

## 2023-02-02 MED ORDER — METOCLOPRAMIDE HCL 5 MG/ML IJ SOLN: 5.0000 mg | Freq: Three times a day (TID) | INTRAMUSCULAR | Status: DC | PRN

## 2023-02-02 MED ORDER — METOCLOPRAMIDE HCL 5 MG PO TABS: 5.0000 mg | ORAL_TABLET | Freq: Three times a day (TID) | ORAL | Status: DC | PRN

## 2023-02-02 MED ORDER — SODIUM CHLORIDE 0.9 % IV SOLN: INTRAVENOUS | Status: DC

## 2023-02-02 MED ORDER — ACETAMINOPHEN 325 MG PO TABS: 325.0000 mg | ORAL_TABLET | ORAL | Status: DC | PRN

## 2023-02-02 MED ORDER — PRONTOSAN WOUND IRRIGATION OPTIME
TOPICAL | Status: DC | PRN
  Administered 2023-02-02: 350 mL via TOPICAL

## 2023-02-02 MED ORDER — TRANEXAMIC ACID-NACL 1000-0.7 MG/100ML-% IV SOLN
1000.0000 mg | INTRAVENOUS | Status: AC
  Administered 2023-02-02: 1000 mg via INTRAVENOUS
  Filled 2023-02-02: qty 100

## 2023-02-02 MED ORDER — PROPOFOL 500 MG/50ML IV EMUL
INTRAVENOUS | Status: DC | PRN
  Administered 2023-02-02: 50 ug/kg/min via INTRAVENOUS

## 2023-02-02 MED ORDER — PROPOFOL 10 MG/ML IV BOLUS
INTRAVENOUS | Status: DC | PRN
  Administered 2023-02-02: 50 mg via INTRAVENOUS

## 2023-02-02 MED ORDER — MENTHOL 3 MG MT LOZG: 1.0000 | LOZENGE | OROMUCOSAL | Status: DC | PRN

## 2023-02-02 MED ORDER — ONDANSETRON HCL 4 MG PO TABS: 4.0000 mg | ORAL_TABLET | Freq: Four times a day (QID) | ORAL | Status: DC | PRN

## 2023-02-02 MED ORDER — ONDANSETRON HCL 4 MG/2ML IJ SOLN
INTRAMUSCULAR | Status: AC
  Filled 2023-02-02: qty 2

## 2023-02-02 MED ORDER — MIDAZOLAM HCL 2 MG/2ML IJ SOLN
INTRAMUSCULAR | Status: DC | PRN
  Administered 2023-02-02: 2 mg via INTRAVENOUS

## 2023-02-02 MED ORDER — FERROUS SULFATE 325 (65 FE) MG PO TABS
325.0000 mg | ORAL_TABLET | Freq: Three times a day (TID) | ORAL | Status: DC
  Administered 2023-02-02 – 2023-02-03 (×3): 325 mg via ORAL
  Filled 2023-02-02 (×3): qty 1

## 2023-02-02 MED ORDER — KETOROLAC TROMETHAMINE 15 MG/ML IJ SOLN
7.5000 mg | Freq: Four times a day (QID) | INTRAMUSCULAR | Status: AC
  Administered 2023-02-02 – 2023-02-03 (×4): 7.5 mg via INTRAVENOUS
  Filled 2023-02-02 (×4): qty 1

## 2023-02-02 MED ORDER — BUPIVACAINE IN DEXTROSE 0.75-8.25 % IT SOLN
INTRATHECAL | Status: DC | PRN
  Administered 2023-02-02: 1.8 mL via INTRATHECAL

## 2023-02-02 MED ORDER — OXYCODONE HCL ER 10 MG PO T12A
10.0000 mg | EXTENDED_RELEASE_TABLET | Freq: Two times a day (BID) | ORAL | Status: DC
  Administered 2023-02-02 – 2023-02-03 (×3): 10 mg via ORAL
  Filled 2023-02-02 (×3): qty 1

## 2023-02-02 MED ORDER — FENTANYL CITRATE (PF) 100 MCG/2ML IJ SOLN: 25.0000 ug | INTRAMUSCULAR | Status: DC | PRN

## 2023-02-02 MED ORDER — ACETAMINOPHEN 160 MG/5ML PO SOLN: 325.0000 mg | ORAL | Status: DC | PRN

## 2023-02-02 MED ORDER — OXYCODONE HCL 5 MG PO TABS: 5.0000 mg | ORAL_TABLET | Freq: Once | ORAL | Status: DC | PRN

## 2023-02-02 MED ORDER — ACETAMINOPHEN 325 MG PO TABS: 325.0000 mg | ORAL_TABLET | Freq: Four times a day (QID) | ORAL | Status: DC | PRN

## 2023-02-02 MED ORDER — ROPIVACAINE HCL 5 MG/ML IJ SOLN
INTRAMUSCULAR | Status: DC | PRN
  Administered 2023-02-02: 20 mL via PERINEURAL

## 2023-02-02 MED ORDER — ONDANSETRON HCL 4 MG/2ML IJ SOLN: 4.0000 mg | Freq: Four times a day (QID) | INTRAMUSCULAR | Status: DC | PRN

## 2023-02-02 MED ORDER — EPHEDRINE SULFATE-NACL 50-0.9 MG/10ML-% IV SOSY
PREFILLED_SYRINGE | INTRAVENOUS | Status: DC | PRN
  Administered 2023-02-02 (×2): 5 mg via INTRAVENOUS

## 2023-02-02 MED ORDER — CHLORHEXIDINE GLUCONATE 0.12 % MT SOLN
15.0000 mL | Freq: Once | OROMUCOSAL | Status: AC
  Administered 2023-02-02: 15 mL via OROMUCOSAL
  Filled 2023-02-02: qty 15

## 2023-02-02 MED ORDER — VANCOMYCIN HCL 1000 MG IV SOLR
INTRAVENOUS | Status: AC
  Filled 2023-02-02: qty 20

## 2023-02-02 MED ORDER — FENTANYL CITRATE (PF) 250 MCG/5ML IJ SOLN
INTRAMUSCULAR | Status: AC
  Filled 2023-02-02: qty 5

## 2023-02-02 MED ORDER — OXYCODONE HCL 5 MG/5ML PO SOLN: 5.0000 mg | Freq: Once | ORAL | Status: DC | PRN

## 2023-02-02 MED ORDER — BUPIVACAINE-MELOXICAM ER 400-12 MG/14ML IJ SOLN
INTRAMUSCULAR | Status: DC | PRN
  Administered 2023-02-02: 400 mg

## 2023-02-02 MED ORDER — ONDANSETRON HCL 4 MG/2ML IJ SOLN
INTRAMUSCULAR | Status: DC | PRN
  Administered 2023-02-02: 4 mg via INTRAVENOUS

## 2023-02-02 MED ORDER — ALBUMIN HUMAN 5 % IV SOLN: INTRAVENOUS | Status: DC | PRN

## 2023-02-02 SURGICAL SUPPLY — 81 items
ADH SKN CLS APL DERMABOND .7 (GAUZE/BANDAGES/DRESSINGS) ×1
ADH SKN CLS LQ APL DERMABOND (GAUZE/BANDAGES/DRESSINGS) ×1
ALCOHOL 70% 16 OZ (MISCELLANEOUS) ×1 IMPLANT
BAG COUNTER SPONGE SURGICOUNT (BAG) IMPLANT
BAG DECANTER FOR FLEXI CONT (MISCELLANEOUS) ×1 IMPLANT
BAG SPNG CNTER NS LX DISP (BAG) ×1
BLADE SAG 18X100X1.27 (BLADE) ×1 IMPLANT
BLADE SAW SGTL 73X25 THK (BLADE) ×1 IMPLANT
BOWL SMART MIX CTS (DISPOSABLE) ×1 IMPLANT
BSPLAT TIB 5D D CMNT STM LT (Knees) ×1 IMPLANT
CEMENT BONE REFOBACIN R1X40 US (Cement) IMPLANT
CLSR STERI-STRIP ANTIMIC 1/2X4 (GAUZE/BANDAGES/DRESSINGS) ×2 IMPLANT
COMP PATELLA 32 STD 8.5 THK (Orthopedic Implant) IMPLANT
COMPONENT KNEE FEM SZ9 LT (Knees) IMPLANT
COOLER ICEMAN CLASSIC (MISCELLANEOUS) ×1 IMPLANT
COVER SURGICAL LIGHT HANDLE (MISCELLANEOUS) ×1 IMPLANT
CUFF TOURN SGL QUICK 34 (TOURNIQUET CUFF) ×1
CUFF TRNQT CYL 34X4.125X (TOURNIQUET CUFF) ×1 IMPLANT
DERMABOND ADVANCED .7 DNX12 (GAUZE/BANDAGES/DRESSINGS) ×1 IMPLANT
DERMABOND ADVANCED .7 DNX6 (GAUZE/BANDAGES/DRESSINGS) IMPLANT
DRAPE EXTREMITY T 121X128X90 (DISPOSABLE) ×1 IMPLANT
DRAPE HALF SHEET 40X57 (DRAPES) ×1 IMPLANT
DRAPE INCISE IOBAN 66X45 STRL (DRAPES) ×1 IMPLANT
DRAPE ORTHO SPLIT 77X108 STRL (DRAPES) ×1
DRAPE POUCH INSTRU U-SHP 10X18 (DRAPES) ×1 IMPLANT
DRAPE SURG ORHT 6 SPLT 77X108 (DRAPES) IMPLANT
DRAPE U-SHAPE 47X51 STRL (DRAPES) ×2 IMPLANT
DRSG AQUACEL AG ADV 3.5X10 (GAUZE/BANDAGES/DRESSINGS) ×1 IMPLANT
DURAPREP 26ML APPLICATOR (WOUND CARE) ×3 IMPLANT
ELECT CAUTERY BLADE 6.4 (BLADE) ×1 IMPLANT
ELECT PENCIL ROCKER SW 15FT (MISCELLANEOUS) ×1 IMPLANT
ELECT REM PT RETURN 9FT ADLT (ELECTROSURGICAL) ×1
ELECTRODE REM PT RTRN 9FT ADLT (ELECTROSURGICAL) ×1 IMPLANT
GLOVE BIOGEL PI IND STRL 7.0 (GLOVE) ×2 IMPLANT
GLOVE BIOGEL PI IND STRL 7.5 (GLOVE) ×5 IMPLANT
GLOVE ECLIPSE 7.0 STRL STRAW (GLOVE) ×3 IMPLANT
GLOVE INDICATOR 7.0 STRL GRN (GLOVE) ×1 IMPLANT
GLOVE INDICATOR 7.5 STRL GRN (GLOVE) ×1 IMPLANT
GLOVE SURG SYN 7.5 E (GLOVE) ×2 IMPLANT
GLOVE SURG SYN 7.5 PF PI (GLOVE) ×2 IMPLANT
GLOVE SURG UNDER LTX SZ7.5 (GLOVE) ×2 IMPLANT
GLOVE SURG UNDER POLY LF SZ7 (GLOVE) ×2 IMPLANT
GOWN STRL REUS W/ TWL LRG LVL3 (GOWN DISPOSABLE) ×1 IMPLANT
GOWN STRL REUS W/TWL LRG LVL3 (GOWN DISPOSABLE) ×1
GOWN STRL SURGICAL XL XLNG (GOWN DISPOSABLE) ×1 IMPLANT
GOWN TOGA ZIPPER T7+ PEEL AWAY (MISCELLANEOUS) ×2 IMPLANT
HANDPIECE INTERPULSE COAX TIP (DISPOSABLE) ×1
HOOD PEEL AWAY T7 (MISCELLANEOUS) ×1 IMPLANT
KIT BASIN OR (CUSTOM PROCEDURE TRAY) ×1 IMPLANT
KIT TURNOVER KIT B (KITS) ×1 IMPLANT
LINER TIB PS CD/8-9 10 LT (Liner) IMPLANT
MANIFOLD NEPTUNE II (INSTRUMENTS) ×1 IMPLANT
MARKER SKIN DUAL TIP RULER LAB (MISCELLANEOUS) ×2 IMPLANT
NDL SPNL 18GX3.5 QUINCKE PK (NEEDLE) ×1 IMPLANT
NEEDLE SPNL 18GX3.5 QUINCKE PK (NEEDLE) ×1 IMPLANT
NS IRRIG 1000ML POUR BTL (IV SOLUTION) ×1 IMPLANT
PACK TOTAL JOINT (CUSTOM PROCEDURE TRAY) ×1 IMPLANT
PAD ARMBOARD 7.5X6 YLW CONV (MISCELLANEOUS) ×2 IMPLANT
PAD COLD SHLDR WRAP-ON (PAD) ×1 IMPLANT
PATELLA ZIMMER 32MM (Orthopedic Implant) ×1 IMPLANT
PIN DRILL HDLS TROCAR 75 4PK (PIN) IMPLANT
SCREW FEMALE HEX FIX 25X2.5 (ORTHOPEDIC DISPOSABLE SUPPLIES) IMPLANT
SET HNDPC FAN SPRY TIP SCT (DISPOSABLE) ×1 IMPLANT
SOLUTION PRONTOSAN WOUND 350ML (IRRIGATION / IRRIGATOR) ×1 IMPLANT
STAPLER VISISTAT 35W (STAPLE) IMPLANT
STEM TIB ST PERS 14+30 (Stem) IMPLANT
STEM TIBIA 5 DEG SZ D L KNEE (Knees) IMPLANT
SUCTION TUBE FRAZIER 10FR DISP (SUCTIONS) ×1 IMPLANT
SUT VIC AB 0 CT1 27 (SUTURE) ×2
SUT VIC AB 0 CT1 27XBRD ANBCTR (SUTURE) ×2 IMPLANT
SUT VIC AB 1 CTX 27 (SUTURE) ×3 IMPLANT
SUT VIC AB 2-0 CT1 27 (SUTURE) ×4
SUT VIC AB 2-0 CT1 TAPERPNT 27 (SUTURE) ×4 IMPLANT
SYR 50ML LL SCALE MARK (SYRINGE) ×2 IMPLANT
TIBIA STEM 5 DEG SZ D L KNEE (Knees) ×1 IMPLANT
TOWEL GREEN STERILE (TOWEL DISPOSABLE) ×1 IMPLANT
TOWEL GREEN STERILE FF (TOWEL DISPOSABLE) ×1 IMPLANT
TRAY CATH INTERMITTENT SS 16FR (CATHETERS) IMPLANT
TUBE SUCT ARGYLE STRL (TUBING) ×1 IMPLANT
UNDERPAD 30X36 HEAVY ABSORB (UNDERPADS AND DIAPERS) ×1 IMPLANT
YANKAUER SUCT BULB TIP NO VENT (SUCTIONS) ×2 IMPLANT

## 2023-02-02 NOTE — Op Note (Signed)
Total Knee Arthroplasty Procedure Note  Preoperative diagnosis: Left knee osteoarthritis  Postoperative diagnosis:same  Operative findings: Complete loss of joint space Mild varus deformity Mild flexion contracture Osteopenic bone  Operative procedure: Left total knee arthroplasty. CPT [phone]  Surgeon: N. Glee Arvin, MD  Assist: Oneal Grout, PA-C; necessary for the timely completion of procedure and due to complexity of procedure.  Anesthesia: Spinal, regional, local  Tourniquet time: see anesthesia record  Implants used: Zimmer persona Femur: CR 9 narrow Tibia: D with 30 mm cemented stem Patella: 32 mm Polyethylene: 10 mm medial congruent  Indication: Ashley Blair is a 67 y.o. year old female with a history of knee pain. Having failed conservative management, the patient elected to proceed with a total knee arthroplasty.  We have reviewed the risk and benefits of the surgery and they elected to proceed after voicing understanding.  Procedure:  After informed consent was obtained and understanding of the risk were voiced including but not limited to bleeding, infection, damage to surrounding structures including nerves and vessels, blood clots, leg length inequality and the failure to achieve desired results, the operative extremity was marked with verbal confirmation of the patient in the holding area.   The patient was then brought to the operating room and transported to the operating room table in the supine position.  A tourniquet was applied to the operative extremity around the upper thigh. The operative limb was then prepped and draped in the usual sterile fashion and preoperative antibiotics were administered.  A time out was performed prior to the start of surgery confirming the correct extremity, preoperative antibiotic administration, as well as team members, implants and instruments available for the case. Correct surgical site was also confirmed with  preoperative radiographs. The limb was then elevated for exsanguination and the tourniquet was inflated. A midline incision was made and a standard medial parapatellar approach was performed.  The infrapatellar fat pad was removed.  Suprapatellar synovium was removed to reveal the anterior distal femoral cortex.  A medial peel was performed to release the capsule of the medial tibial plateau.  The patella was then everted and was prepared and sized to a 32 mm.  A cover was placed on the patella for protection from retractors.  The knee was then brought into full flexion and we then turned our attention to the femur.  The cruciates were sacrificed.  Start site was drilled in the femur and the intramedullary distal femoral cutting guide was placed, set at 5 degrees valgus, taking 11 mm of distal resection. The distal cut was made. Osteophytes were then removed.  Next, the proximal tibial cutting guide was placed with appropriate slope, varus/valgus alignment and depth of resection. The proximal tibial cut was made taking 4 mm off the low side. Gap blocks were then used to assess the extension gap and alignment, and appropriate soft tissue releases were performed. Attention was turned back to the femur, which was sized using the sizing guide to a size 9 narrow. Appropriate rotation of the femoral component was determined using epicondylar axis, Whiteside's line, and assessing the flexion gap under ligament tension. The appropriate size 4-in-1 cutting block was placed and checked with an angel wing and cuts were made. Posterior femoral osteophytes and uncapped bone were then removed with the curved osteotome.  Trial components were placed, and stability was checked in full extension, mid-flexion, and deep flexion. Proper tibial rotation was determined and marked.  The patella tracked well without a lateral release.  The femoral lugs were then drilled. Trial components were then removed and tibial preparation  performed.  The tibia was sized for a size D component.   The bony surfaces were irrigated with a pulse lavage and then dried. Bone cement was vacuum mixed on the back table, and the final components sized above were cemented into place.  Antibiotic irrigation was placed in the knee joint and soft tissues while the cement cured.  After cement had finished curing, excess cement was removed. The stability of the construct was re-evaluated throughout a range of motion and found to be acceptable. The trial liner was removed, the knee was copiously irrigated, and the knee was re-evaluated for any excess bone debris. The real polyethylene liner, 10 mm thick, was inserted and checked to ensure the locking mechanism had engaged appropriately. The tourniquet was deflated and hemostasis was achieved. The wound was irrigated with normal saline.  One gram of vancomycin powder was placed in the surgical bed.  Topical 0.25% bupivacaine and meloxicam was placed in the joint for postoperative pain.  Capsular closure was performed with a #1 vicryl, subcutaneous fat closed with a 0 vicryl suture, then subcutaneous tissue closed with interrupted 2.0 vicryl suture. The skin was then closed with a 2.0 nylon and dermabond. A sterile dressing was applied.  The patient was awakened in the operating room and taken to recovery in stable condition. All sponge, needle, and instrument counts were correct at the end of the case.  Tessa Lerner was necessary for opening, closing, retracting, limb positioning and overall facilitation and completion of the surgery.  Position: supine  Complications: none.  Time Out: performed   Drains/Packing: none  Estimated blood loss: minimal  Returned to Recovery Room: in good condition.   Antibiotics: yes   Mechanical VTE (DVT) Prophylaxis: sequential compression devices, TED thigh-high  Chemical VTE (DVT) Prophylaxis: aspirin POD 0, xarelto POD 1  Fluid Replacement  Crystalloid: see  anesthesia record Blood: none  FFP: none   Specimens Removed: 1 to pathology   Sponge and Instrument Count Correct? yes   PACU: portable radiograph - knee AP and Lateral   Plan/RTC: Return in 2 weeks for wound check.   Weight Bearing/Load Lower Extremity: full   Implant Name Type Inv. Item Serial No. Manufacturer Lot No. LRB No. Used Action  CEMENT BONE REFOBACIN [id number] [place] - [id number] Cement CEMENT BONE REFOBACIN R1X40 [place]  ZIMMER RECON(ORTH,TRAU,BIO,SG) [id number] Left 1 Implanted  CEMENT BONE REFOBACIN R1X40 [place] - [id number] Cement CEMENT BONE REFOBACIN R1X40 [place]  ZIMMER RECON(ORTH,TRAU,BIO,SG) [id number] Left 1 Implanted  BSPLAT TIB 5D D CMNT STM LT - [id number] Knees BSPLAT TIB 5D D CMNT STM LT  ZIMMER RECON(ORTH,TRAU,BIO,SG) [id number] Left 1 Implanted  COMPONENT KNEE FEM SZ9 LT - [id number] Knees COMPONENT KNEE FEM SZ9 LT  ZIMMER RECON(ORTH,TRAU,BIO,SG) [id number] Left 1 Implanted  LINER TIB PS CD/8-9 10 LT - [id number] Liner LINER TIB PS CD/8-9 10 LT  ZIMMER RECON(ORTH,TRAU,BIO,SG) [id number] Left 1 Implanted  STEM TIB ST PERS 14+30 - [id number] Stem STEM TIB ST PERS 14+30  ZIMMER RECON(ORTH,TRAU,BIO,SG) [id number] Left 1 Implanted  All Poly Patella Standard Cemented    ZIMMER [id number] Left 1 Implanted    N. Glee Arvin, MD [facility] 9:00 AM

## 2023-02-02 NOTE — Discharge Instructions (Signed)

## 2023-02-02 NOTE — Anesthesia Procedure Notes (Signed)
Spinal  Patient location during procedure: OR Start time: 02/02/2023 7:22 AM End time: 02/02/2023 7:27 AM Reason for block: surgical anesthesia Staffing Anesthesiologist: Bethena Midget, MD Performed by: Bethena Midget, MD Authorized by: Bethena Midget, MD   Preanesthetic Checklist Completed: patient identified, IV checked, site marked, risks and benefits discussed, surgical consent, monitors and equipment checked, pre-op evaluation and timeout performed Spinal Block Patient position: sitting Prep: DuraPrep Patient monitoring: heart rate, cardiac monitor, continuous pulse ox and blood pressure Approach: midline Location: L3-4 Injection technique: single-shot Needle Needle type: Sprotte  Needle gauge: 24 G Needle length: 9 cm Assessment Sensory level: T4 Events: CSF return

## 2023-02-02 NOTE — Anesthesia Postprocedure Evaluation (Signed)
Anesthesia Post Note  Patient: Ashley Blair  Procedure(s) Performed: LEFT TOTAL KNEE ARTHROPLASTY (Left: Knee)     Patient location during evaluation: PACU Anesthesia Type: Regional, MAC and Spinal Level of consciousness: oriented and awake and alert Pain management: pain level controlled Vital Signs Assessment: post-procedure vital signs reviewed and stable Respiratory status: spontaneous breathing, respiratory function stable and patient connected to nasal cannula oxygen Cardiovascular status: blood pressure returned to baseline and stable Postop Assessment: no headache, no backache and no apparent nausea or vomiting Anesthetic complications: no   No notable events documented.  Last Vitals:  Vitals:   02/02/23 1015 02/02/23 1040  BP: (!) 142/72 (!) 147/81  Pulse: (!) 55 (!) 56  Resp: 18 20  Temp: 36.6 C 36.5 C  SpO2: 96% 99%    Last Pain:  Vitals:   02/02/23 1040  TempSrc: Oral  PainSc:                  [name]

## 2023-02-02 NOTE — Transfer of Care (Signed)
Immediate Anesthesia Transfer of Care Note  Patient: Ashley Blair  Procedure(s) Performed: LEFT TOTAL KNEE ARTHROPLASTY (Left: Knee)  Patient Location: PACU  Anesthesia Type:Spinal  Level of Consciousness: awake, alert , oriented, and patient cooperative  Airway & Oxygen Therapy: Patient Spontanous Breathing  Post-op Assessment: Report given to RN, Post -op Vital signs reviewed and stable, and Patient moving all extremities X 4  Post vital signs: Reviewed and stable  Last Vitals:  Vitals Value Taken Time  BP 120/68 02/02/23 0933  Temp    Pulse 62 02/02/23 0935  Resp 17 02/02/23 0935  SpO2 97 % 02/02/23 0935  Vitals shown include unvalidated device data.  Last Pain:  Vitals:   02/02/23 0607  PainSc: 0-No pain         Complications: No notable events documented.

## 2023-02-02 NOTE — Evaluation (Signed)
Physical Therapy Evaluation Patient Details Name: Ashley Blair MRN: [id number] DOB: 1956/05/08 Today's Date: 02/02/2023  History of Present Illness  Pt is a 67 y.o. F who presents s/p L TKA 02/02/2023. No significant PMH.  Clinical Impression  Pt admitted s/p procedure listed above. Overall pt presents with excellent pain control and is mobilizing well POD #0. Reviewed HEP for left knee strengthening/ROM and provided handout. Pt ambulating 200 ft with a walker, utilizing a step through pattern. Will trial stair training in AM, but anticipate no difficulties.       Assistance Recommended at Discharge PRN  If plan is discharge home, recommend the following:  Can travel by private vehicle  Assist for transportation;Help with stairs or ramp for entrance        Equipment Recommendations None recommended by PT (pt has RW)  Recommendations for Other Services       Functional Status Assessment Patient has had a recent decline in their functional status and demonstrates the ability to make significant improvements in function in a reasonable and predictable amount of time.     Precautions / Restrictions Precautions Precautions: None Restrictions Weight Bearing Restrictions: No      Mobility  Bed Mobility Overal bed mobility: Modified Independent                  Transfers Overall transfer level: Modified independent Equipment used: Rolling walker (2 wheels)                    Ambulation/Gait Ambulation/Gait assistance: Supervision Gait Distance (Feet): 200 Feet Assistive device: Rolling walker (2 wheels) Gait Pattern/deviations: Step-through pattern, Decreased step length - left, Decreased weight shift to left Gait velocity: decreased     General Gait Details: Step through pattern, good L heel strike, steady pace  Stairs            Wheelchair Mobility     Tilt Bed    Modified Rankin (Stroke Patients Only)       Balance Overall balance  assessment: Mild deficits observed, not formally tested                                           Pertinent Vitals/Pain Pain Assessment Pain Assessment: Faces Faces Pain Scale: Hurts a little bit Pain Location: L knee Pain Descriptors / Indicators: Operative site guarding Pain Intervention(s): Monitored during session    Home Living Family/patient expects to be discharged to:: Private residence Living Arrangements: Spouse/significant other Available Help at Discharge: Family Type of Home: House Home Access: Stairs to enter Entrance Stairs-Rails: Left Entrance Stairs-Number of Steps: 4 Alternate Level Stairs-Number of Steps:  (flight) Home Layout: Two level Home Equipment: Agricultural consultant (2 wheels);Shower seat      Prior Function Prior Level of Function : Independent/Modified Independent             Mobility Comments: Enjoys Pilates, walking her dogs       Hand Dominance        Extremity/Trunk Assessment   Upper Extremity Assessment Upper Extremity Assessment: Overall WFL for tasks assessed    Lower Extremity Assessment Lower Extremity Assessment: LLE deficits/detail LLE Deficits / Details: s/p TKA. At least anti gravity strength, able to perform SLR    Cervical / Trunk Assessment Cervical / Trunk Assessment: Normal  Communication   Communication: No difficulties  Cognition Arousal/Alertness: Awake/alert Behavior During Therapy: [facility]  for tasks assessed/performed Overall Cognitive Status: Within Functional Limits for tasks assessed                                          General Comments      Exercises Total Joint Exercises Ankle Circles/Pumps: Left, 10 reps, Supine Quad Sets: Left, 10 reps, Supine Heel Slides: Left, 10 reps, Supine Hip ABduction/ADduction: Left, 10 reps, Supine Straight Leg Raises: Left, 5 reps, Supine Long Arc Quad: Left, 10 reps, Seated   Assessment/Plan    PT Assessment Patient needs  continued PT services  PT Problem List Decreased strength;Decreased range of motion;Pain       PT Treatment Interventions DME instruction;Stair training;Gait training;Functional mobility training;Therapeutic activities;Therapeutic exercise;Balance training;Patient/family education    PT Goals (Current goals can be found in the Care Plan section)  Acute Rehab PT Goals Patient Stated Goal: back to independence PT Goal Formulation: All assessment and education complete, DC therapy    Frequency 7X/week     Co-evaluation               AM-PAC PT "6 Clicks" Mobility  Outcome Measure Help needed turning from your back to your side while in a flat bed without using bedrails?: None Help needed moving from lying on your back to sitting on the side of a flat bed without using bedrails?: None Help needed moving to and from a bed to a chair (including a wheelchair)?: None Help needed standing up from a chair using your arms (e.g., wheelchair or bedside chair)?: None Help needed to walk in hospital room?: A Little Help needed climbing 3-5 steps with a railing? : A Little 6 Click Score: 22    End of Session Equipment Utilized During Treatment: Gait belt Activity Tolerance: Patient tolerated treatment well Patient left: in chair;with call bell/phone within reach;with family/visitor present Nurse Communication: Mobility status PT Visit Diagnosis: Other abnormalities of gait and mobility (R26.89);Pain Pain - Right/Left: Left Pain - part of body: Knee    Time: 9528-4132 PT Time Calculation (min) (ACUTE ONLY): 24 min   Charges:   PT Evaluation $PT Eval Low Complexity: 1 Low PT Treatments $Therapeutic Exercise: 8-22 mins PT General Charges $$ ACUTE PT VISIT: 1 Visit         Lillia Pauls, PT, DPT Acute Rehabilitation Services Office [phone]   Ashley Blair 02/02/2023, 4:29 PM

## 2023-02-02 NOTE — Anesthesia Procedure Notes (Signed)
Anesthesia Regional Block: Adductor canal block   Pre-Anesthetic Checklist: , timeout performed,  Correct Patient, Correct Site, Correct Laterality,  Correct Procedure, Correct Position, site marked,  Risks and benefits discussed,  Surgical consent,  Pre-op evaluation,  At surgeon's request and post-op pain management  Laterality: Left  Prep: chloraprep       Needles:  Injection technique: Single-shot  Needle Type: Echogenic Stimulator Needle     Needle Length: 5cm  Needle Gauge: 22     Additional Needles:   Procedures:,,,, ultrasound used (permanent image in chart),,    Narrative:  Start time: 02/02/2023 7:00 AM End time: 02/02/2023 7:05 AM Injection made incrementally with aspirations every 5 mL.  Performed by: Personally  Anesthesiologist: Bethena Midget, MD  Additional Notes: Functioning IV was confirmed and monitors were applied.  A 50mm 22ga Arrow echogenic stimulator needle was used. Sterile prep and drape,hand hygiene and sterile gloves were used. Ultrasound guidance: relevant anatomy identified, needle position confirmed, local anesthetic spread visualized around nerve(s)., vascular puncture avoided.  Image printed for medical record. Negative aspiration and negative test dose prior to incremental administration of local anesthetic. The patient tolerated the procedure well.

## 2023-02-02 NOTE — Anesthesia Procedure Notes (Signed)
Date/Time: 02/02/2023 7:15 AM  Performed by: Shary Decamp, CRNAPre-anesthesia Checklist: Patient identified, Emergency Drugs available, Suction available, Timeout performed and Patient being monitored Patient Re-evaluated:Patient Re-evaluated prior to induction Oxygen Delivery Method: Simple face mask

## 2023-02-02 NOTE — H&P (Signed)
PREOPERATIVE H&P  Chief Complaint: left knee osteoarthritis  HPI: Ashley Blair is a 67 y.o. female who presents for surgical treatment of left knee osteoarthritis.  She denies any changes in medical history.  Past Medical History:  Diagnosis Date   Anxiety    Arthritis    Elevated hemoglobin A1c 2019   5.7   Family history of DES exposure    Fibrocystic breast    GERD (gastroesophageal reflux disease)    HPV (human papilloma virus) infection    Hypercholesterolemia    OCD (obsessive compulsive disorder)    Pre-diabetes    S/P excision of lipoma    Past Surgical History:  Procedure Laterality Date   ANKLE SURGERY     broken as a child   BREAST BIOPSY Right    COLONOSCOPY     DILATION AND CURETTAGE OF UTERUS     LIPOMA EXCISION     rib cage   TRIGGER FINGER RELEASE Left 03/2013   thumb   Social History   Socioeconomic History   Marital status: Married    Spouse name: Not on file   Number of children: Not on file   Years of education: Not on file   Highest education level: Not on file  Occupational History   Not on file  Tobacco Use   Smoking status: Former    Types: Cigarettes    Quit date: 12/07/1975    Years since quitting: 47.1   Smokeless tobacco: Never  Vaping Use   Vaping Use: Never used  Substance and Sexual Activity   Alcohol use: Not Currently    Comment: rarely   Drug use: No   Sexual activity: Not Currently    Partners: Male    Birth control/protection: Post-menopausal  Other Topics Concern   Not on file  Social History Narrative   Not on file   Social Determinants of Health   Financial Resource Strain: Not on file  Food Insecurity: Not on file  Transportation Needs: Not on file  Physical Activity: Not on file  Stress: Not on file  Social Connections: Not on file   Family History  Problem Relation Age of Onset   Breast cancer Mother    Osteoporosis Maternal Grandmother    Stroke Maternal Grandfather    Allergies   Allergen Reactions   Aspirin Swelling   Cortisone Other (See Comments)    hot/redness   Esomeprazole Other (See Comments)    Ineffective   Lansoprazole Other (See Comments)    Ineffective   Omeprazole Other (See Comments)    Ineffective   Omeprazole-Sodium Bicarbonate Other (See Comments)    Ineffective   Pantoprazole Other (See Comments)    Ineffective   Prior to Admission medications   Medication Sig Start Date End Date Taking? Authorizing Provider  augmented betamethasone dipropionate (DIPROLENE-AF) 0.05 % ointment Apply 1 Application topically as needed (skin rash).   Yes [provider]  CALCIUM PO Take 1,000 mg by mouth daily.   Yes [provider]  DEXILANT 60 MG capsule Take 60 mg by mouth every Monday, Wednesday, and Friday. Additonional of needed 12/19/20  Yes [provider]  docusate sodium (COLACE) 100 MG capsule Take 1 capsule (100 mg total) by mouth daily as needed. 01/26/23 01/26/24  Cristie Hem, PA-C  escitalopram (LEXAPRO) 10 MG tablet Take 10 mg by mouth daily.   Yes [provider]  methocarbamol (ROBAXIN-750) 750 MG tablet Take 1 tablet (750 mg total) by mouth 2 (two)  times daily as needed for muscle spasms. 01/26/23   Cristie Hem, PA-C  naproxen (NAPROSYN) 500 MG tablet Take 500 mg by mouth daily as needed for mild pain or moderate pain. 02/25/21  Yes [provider]  ondansetron (ZOFRAN) 4 MG tablet Take 1 tablet (4 mg total) by mouth every 8 (eight) hours as needed for nausea or vomiting. 01/26/23   Cristie Hem, PA-C  oxyCODONE-acetaminophen (PERCOCET) 5-325 MG tablet Take 1-2 tablets by mouth every 6 (six) hours as needed. To be taken after surgery 01/26/23   Cristie Hem, PA-C  Probiotic Product (ALIGN) 4 MG CAPS Take 1 capsule by mouth daily.   Yes [provider]  rivaroxaban (XARELTO) 10 MG TABS tablet Take 1 tablet (10 mg total) by mouth daily. To be taken after surgery to prevent blood clots  01/26/23   Cristie Hem, PA-C  calcium-vitamin D (OSCAL WITH D) 500-5 MG-MCG tablet Take 1 tablet by mouth.    [provider]  COVID-19 mRNA bivalent vaccine, Pfizer, (PFIZER COVID-19 VAC BIVALENT) injection Inject into the muscle. 12/05/21   Judyann Munson, MD  influenza vaccine adjuvanted (FLUAD) 0.5 ML injection Inject into the muscle. 04/30/22   Judyann Munson, MD     Positive ROS: All other systems have been reviewed and were otherwise negative with the exception of those mentioned in the HPI and as above.  Physical Exam: General: Alert, no acute distress Cardiovascular: No pedal edema Respiratory: No cyanosis, no use of accessory musculature GI: abdomen soft Skin: No lesions in the area of chief complaint Neurologic: Sensation intact distally Psychiatric: Patient is competent for consent with normal mood and affect Lymphatic: no lymphedema  MUSCULOSKELETAL: exam stable  Assessment: left knee osteoarthritis  Plan: Plan for Procedure(s): LEFT TOTAL KNEE ARTHROPLASTY  The risks benefits and alternatives were discussed with the patient including but not limited to the risks of nonoperative treatment, versus surgical intervention including infection, bleeding, nerve injury,  blood clots, cardiopulmonary complications, morbidity, mortality, among others, and they were willing to proceed.   Glee Arvin, MD 02/02/2023 6:05 AM

## 2023-02-03 DIAGNOSIS — Z96652 Presence of left artificial knee joint: Secondary | ICD-10-CM | POA: Diagnosis not present

## 2023-02-03 DIAGNOSIS — M1712 Unilateral primary osteoarthritis, left knee: Secondary | ICD-10-CM | POA: Diagnosis not present

## 2023-02-03 LAB — CBC
HCT: 37.1 % (ref 36.0–46.0)
Hemoglobin: 12.1 g/dL (ref 12.0–15.0)
MCH: 32.4 pg (ref 26.0–34.0)
MCHC: 32.6 g/dL (ref 30.0–36.0)
MCV: 99.5 fL (ref 80.0–100.0)
Platelets: 233 10*3/uL (ref 150–400)
RBC: 3.73 MIL/uL — ABNORMAL LOW (ref 3.87–5.11)
RDW: 12.3 % (ref 11.5–15.5)
WBC: 7.5 10*3/uL (ref 4.0–10.5)
nRBC: 0 % (ref 0.0–0.2)

## 2023-02-03 NOTE — Progress Notes (Signed)
Subjective: 1 Day Post-Op Procedure(s) (LRB): LEFT TOTAL KNEE ARTHROPLASTY (Left) Patient reports pain as mild.    Objective: Vital signs in last 24 hours: Temp:  [97.5 F (36.4 C)-98.7 F (37.1 C)] 98.7 F (37.1 C) (07/02 0507) Pulse Rate:  [54-65] 65 (07/02 0507) Resp:  [13-20] 20 (07/02 0507) BP: (93-148)/(54-81) 104/54 (07/02 0507) SpO2:  [94 %-99 %] 94 % (07/02 0507)  Intake/Output from previous day: 07/01 0701 - 07/02 0700 In: 2310 [P.O.:960; I.V.:1000; IV Piggyback:350] Out: 2625 [Urine:2575; Blood:50] Intake/Output this shift: No intake/output data recorded.  Recent Labs    02/03/23 0606  HGB 12.1   Recent Labs    02/03/23 0606  WBC 7.5  RBC 3.73*  HCT 37.1  PLT 233   No results for input(s): "NA", "K", "CL", "CO2", "BUN", "CREATININE", "GLUCOSE", "CALCIUM" in the last 72 hours. No results for input(s): "LABPT", "INR" in the last 72 hours.  Neurologically intact Neurovascular intact Sensation intact distally Intact pulses distally Dorsiflexion/Plantar flexion intact Incision: dressing C/D/I No cellulitis present Compartment soft   Assessment/Plan: 1 Day Post-Op Procedure(s) (LRB): LEFT TOTAL KNEE ARTHROPLASTY (Left) Advance diet Up with therapy D/C IV fluids Discharge home with home health once cleared by PT (likely after first session). WBAT LLE       Cristie Hem 02/03/2023, 7:53 AM

## 2023-02-03 NOTE — Evaluation (Signed)
Occupational Therapy Evaluation Patient Details Name: Ashley Blair MRN: [id number] DOB: January 11, 1956 Today's Date: 02/03/2023   History of Present Illness Pt is a 67 y.o. F who presents s/p L TKA 02/02/2023. No significant PMH.   Clinical Impression   Patient evaluated by Occupational Therapy with no further acute OT needs identified. All education has been completed and the patient has no further questions. See below for any follow-up Occupational Therapy or equipment needs. OT to sign off. Thank you for referral.        Recommendations for follow up therapy are one component of a multi-disciplinary discharge planning process, led by the attending physician.  Recommendations may be updated based on patient status, additional functional criteria and insurance authorization.   Assistance Recommended at Discharge None  Patient can return home with the following Assist for transportation    Functional Status Assessment  Patient has had a recent decline in their functional status and demonstrates the ability to make significant improvements in function in a reasonable and predictable amount of time.  Equipment Recommendations  None recommended by OT    Recommendations for Other Services       Precautions / Restrictions Precautions Precautions: Knee Precaution Booklet Issued: Yes (comment) Precaution Comments: Handout given and reviewed with pt Restrictions Weight Bearing Restrictions: No      Mobility Bed Mobility Overal bed mobility: Modified Independent                  Transfers Overall transfer level: Modified independent                        Balance                                           ADL either performed or assessed with clinical judgement   ADL Overall ADL's : Independent                                       General ADL Comments: dressed during session. educated on shower transfer  Educated on setting  alarm for pain medications and to notify MD office if noted to have water under bandage for any reason   Vision Baseline Vision/History: 1 Wears glasses Ability to See in Adequate Light: 0 Adequate Vision Assessment?: No apparent visual deficits     Perception     Praxis      Pertinent Vitals/Pain Pain Assessment Pain Assessment: Faces Faces Pain Scale: Hurts a little bit Pain Location: L knee Pain Descriptors / Indicators: Operative site guarding Pain Intervention(s): Monitored during session, Premedicated before session, Repositioned     Hand Dominance Right   Extremity/Trunk Assessment Upper Extremity Assessment Upper Extremity Assessment: Overall WFL for tasks assessed   Lower Extremity Assessment Lower Extremity Assessment: Defer to PT evaluation   Cervical / Trunk Assessment Cervical / Trunk Assessment: Normal   Communication Communication Communication: No difficulties   Cognition Arousal/Alertness: Awake/alert Behavior During Therapy: WFL for tasks assessed/performed, Flat affect Overall Cognitive Status: Within Functional Limits for tasks assessed                                       General Comments  dressing  intact and no drainage noted    Exercises Exercises: Total Joint   Shoulder Instructions      Home Living Family/patient expects to be discharged to:: Private residence Living Arrangements: Spouse/significant other Available Help at Discharge: Family Type of Home: House Home Access: Stairs to enter Secretary/administrator of Steps: 4 Entrance Stairs-Rails: Left Home Layout: Two level   Alternate Level Stairs-Rails: Right Bathroom Shower/Tub: Producer, television/film/video: Standard     Home Equipment: Agricultural consultant (2 wheels);Shower seat   Additional Comments: 3 dogs ( labs)      Prior Functioning/Environment Prior Level of Function : Independent/Modified Independent             Mobility Comments: Enjoys  Pilates, walking her dogs          OT Problem List:        OT Treatment/Interventions:      OT Goals(Current goals can be found in the care plan section) Acute Rehab OT Goals Potential to Achieve Goals: Good  OT Frequency:      Co-evaluation              AM-PAC OT "6 Clicks" Daily Activity     Outcome Measure Help from another person eating meals?: None Help from another person taking care of personal grooming?: None Help from another person toileting, which includes using toliet, bedpan, or urinal?: None Help from another person bathing (including washing, rinsing, drying)?: None Help from another person to put on and taking off regular upper body clothing?: None Help from another person to put on and taking off regular lower body clothing?: None 6 Click Score: 24   End of Session Nurse Communication: Mobility status;Precautions  Activity Tolerance: Patient tolerated treatment well Patient left: in bed;with call bell/phone within reach;with family/visitor present  OT Visit Diagnosis: Unsteadiness on feet (R26.81)                Time: 1610-9604 OT Time Calculation (min): 16 min Charges:  OT General Charges $OT Visit: 1 Visit OT Evaluation $OT Eval Moderate Complexity: 1 Mod   Brynn, OTR/L  Acute Rehabilitation Services Office: [phone] .   Mateo Flow 02/03/2023, 12:12 PM

## 2023-02-03 NOTE — Plan of Care (Signed)
  Problem: Education: Goal: Knowledge of the prescribed therapeutic regimen will improve Outcome: Completed/Met Goal: Individualized Educational Video(s) Outcome: Completed/Met   Problem: Activity: Goal: Ability to avoid complications of mobility impairment will improve Outcome: Completed/Met Goal: Range of joint motion will improve Outcome: Completed/Met   Problem: Clinical Measurements: Goal: Postoperative complications will be avoided or minimized Outcome: Completed/Met   Problem: Pain Management: Goal: Pain level will decrease with appropriate interventions Outcome: Completed/Met   Problem: Skin Integrity: Goal: Will show signs of wound healing Outcome: Completed/Met   

## 2023-02-03 NOTE — Progress Notes (Signed)
Patient alert and oriented, void, ambulate. Surgical site clean and dry. D/c instructions explain and given all questions answered. Patient d/c home per order.

## 2023-02-03 NOTE — Progress Notes (Signed)
Physical Therapy Treatment Patient Details Name: Ashley Blair MRN: [id number] DOB: November 01, 1955 Today's Date: 02/03/2023   History of Present Illness Pt is a 67 y.o. F who presents s/p L TKA 02/02/2023. No significant PMH.    PT Comments  Pt received sitting EOB, agreeable to therapy session and with good participation and tolerance for transfer, gait and stair training. Pt given HEP handout and reviewed with her along with use of cryotherapy for pain/edema and L knee precs post-op (no pillow under knee at rest). Pt making excellent progress toward PT goals, min guard level at most for stairs with spouse able to demo back safe guarding level and Supervision to modI for gait/transfers with RW. Anticipate pt safe to DC home with PRN family assist once medically cleared.     Assistance Recommended at Discharge PRN  If plan is discharge home, recommend the following:  Can travel by private vehicle    Assist for transportation;Help with stairs or ramp for entrance      Equipment Recommendations  None recommended by PT (pt has RW)    Recommendations for Other Services       Precautions / Restrictions Precautions Precautions: Knee Precaution Booklet Issued: Yes (comment) Precaution Comments: Handout given and reviewed with pt Restrictions Weight Bearing Restrictions: No     Mobility  Bed Mobility Overal bed mobility: Modified Independent                  Transfers Overall transfer level: Modified independent Equipment used: Rolling walker (2 wheels)                    Ambulation/Gait Ambulation/Gait assistance: Supervision Gait Distance (Feet): 300 Feet Assistive device: Rolling walker (2 wheels) Gait Pattern/deviations: Step-through pattern, Decreased step length - left, Decreased weight shift to left Gait velocity: decreased     General Gait Details: Step through pattern, good L heel strike, steady pace.   Stairs Stairs: Yes Stairs assistance: Min  guard Stair Management: One rail Right, Step to pattern, Forwards Number of Stairs: 10 General stair comments: spouse providing HHA on opposite site, pt cued for sequencing with good carryover.   Wheelchair Mobility     Tilt Bed    Modified Rankin (Stroke Patients Only)       Balance Overall balance assessment: Mild deficits observed, not formally tested                                          Cognition Arousal/Alertness: Awake/alert Behavior During Therapy: WFL for tasks assessed/performed, Flat affect Overall Cognitive Status: Within Functional Limits for tasks assessed                                          Exercises Total Joint Exercises Ankle Circles/Pumps: Left, 10 reps, Supine, AROM Quad Sets: Left, Supine, 5 reps Heel Slides: Left, Supine, 5 reps, AROM Hip ABduction/ADduction: Left, Supine, 5 reps, AROM Straight Leg Raises: Left, 5 reps, Supine Long Arc Quad: Left, 10 reps, Seated, AROM Knee Flexion: AROM, AAROM, Left, 10 reps, Seated (reviewed RLE overpressure to increased L flexion ROM) Goniometric ROM: grossly 0* to 85*, used towel under foot to increase ROM    General Comments General comments (skin integrity, edema, etc.): No acute s/sx distress, VSS on RA, pt had  pants leg pulled down so did not visualize dressing but pt denies concerns re: dressing      Pertinent Vitals/Pain Pain Assessment Pain Assessment: Faces Faces Pain Scale: Hurts little more Pain Location: L knee Pain Descriptors / Indicators: Operative site guarding Pain Intervention(s): Monitored during session, Premedicated before session, Repositioned, Ice applied (reviewed ice freq)     PT Goals (current goals can now be found in the care plan section) Acute Rehab PT Goals Patient Stated Goal: back to independence PT Goal Formulation: All assessment and education complete, DC therapy Progress towards PT goals: Progressing toward goals     Frequency    7X/week      PT Plan Current plan remains appropriate       AM-PAC PT "6 Clicks" Mobility   Outcome Measure  Help needed turning from your back to your side while in a flat bed without using bedrails?: None Help needed moving from lying on your back to sitting on the side of a flat bed without using bedrails?: None Help needed moving to and from a bed to a chair (including a wheelchair)?: None Help needed standing up from a chair using your arms (e.g., wheelchair or bedside chair)?: None Help needed to walk in hospital room?: A Little Help needed climbing 3-5 steps with a railing? : A Little 6 Click Score: 22    End of Session Equipment Utilized During Treatment: Gait belt Activity Tolerance: Patient tolerated treatment well Patient left: in chair;with call bell/phone within reach;with family/visitor present (pt sitting EOB, awaiting DC) Nurse Communication: Mobility status;Other (comment) (pt eager to DC) PT Visit Diagnosis: Other abnormalities of gait and mobility (R26.89);Pain Pain - Right/Left: Left Pain - part of body: Knee     Time: 1610-9604 PT Time Calculation (min) (ACUTE ONLY): 16 min  Charges:    $Gait Training: 8-22 mins PT General Charges $$ ACUTE PT VISIT: 1 Visit                     Ashley Blair P., PTA Acute Rehabilitation Services Secure Chat Preferred 9a-5:30pm Office: [phone]    Dorathy Kinsman [facility] 02/03/2023, 11:33 AM

## 2023-02-03 NOTE — Discharge Summary (Signed)
Patient ID: Ashley Blair MRN: [id number] DOB/AGE: 69-Apr-1957 69 y.o.  Admit date: 02/02/2023 Discharge date: 02/03/2023  Admission Diagnoses:  Principal Problem:   Primary osteoarthritis of left knee Active Problems:   Status post total left knee replacement   Discharge Diagnoses:  Same  Past Medical History:  Diagnosis Date   Anxiety    Arthritis    Elevated hemoglobin A1c 2019   5.7   Family history of DES exposure    Fibrocystic breast    GERD (gastroesophageal reflux disease)    HPV (human papilloma virus) infection    Hypercholesterolemia    OCD (obsessive compulsive disorder)    Pre-diabetes    S/P excision of lipoma     Surgeries: Procedure(s): LEFT TOTAL KNEE ARTHROPLASTY on 02/02/2023   Consultants:   Discharged Condition: Improved  Hospital Course: Mariela Kuzia is an 67 y.o. female who was admitted 02/02/2023 for operative treatment ofPrimary osteoarthritis of left knee. Patient has severe unremitting pain that affects sleep, daily activities, and work/hobbies. After pre-op clearance the patient was taken to the operating room on 02/02/2023 and underwent  Procedure(s): LEFT TOTAL KNEE ARTHROPLASTY.    Patient was given perioperative antibiotics:  Anti-infectives (From admission, onward)    Start     Dose/Rate Route Frequency Ordered Stop   02/02/23 1030  ceFAZolin (ANCEF) IVPB 2g/100 mL premix        2 g 200 mL/hr over 30 Minutes Intravenous Every 6 hours 02/02/23 1021 02/02/23 1812   02/02/23 0755  vancomycin (VANCOCIN) powder  Status:  Discontinued          As needed 02/02/23 0755 02/02/23 0930   02/02/23 0600  ceFAZolin (ANCEF) IVPB 2g/100 mL premix        2 g 200 mL/hr over 30 Minutes Intravenous On call to O.R. 02/02/23 4098 02/02/23 0745        Patient was given sequential compression devices, early ambulation, and chemoprophylaxis to prevent DVT.  Patient benefited maximally from hospital stay and there were no complications.    Recent  vital signs: Patient Vitals for the past 24 hrs:  BP Temp Temp src Pulse Resp SpO2  02/03/23 0507 (!) 104/54 98.7 F (37.1 C) Oral 65 20 94 %  02/02/23 2259 131/63 (!) 97.5 F (36.4 C) Oral (!) 56 20 96 %  02/02/23 2009 93/78 98.5 F (36.9 C) Oral (!) 57 20 96 %  02/02/23 1623 (!) 148/75 98.7 F (37.1 C) Oral (!) 57 20 97 %  02/02/23 1040 (!) 147/81 97.7 F (36.5 C) Oral (!) 56 20 99 %  02/02/23 1015 (!) 142/72 97.8 F (36.6 C) -- (!) 55 18 96 %  02/02/23 1000 135/75 -- -- (!) 54 15 96 %  02/02/23 0945 123/69 -- -- (!) 56 14 94 %  02/02/23 0932 120/68 (!) 97.5 F (36.4 C) -- 63 13 97 %     Recent laboratory studies:  Recent Labs    02/03/23 0606  WBC 7.5  HGB 12.1  HCT 37.1  PLT 233     Discharge Medications:   Allergies as of 02/03/2023       Reactions   Aspirin Swelling   Cortisone Other (See Comments)   hot/redness   Esomeprazole Other (See Comments)   Ineffective   Lansoprazole Other (See Comments)   Ineffective   Omeprazole Other (See Comments)   Ineffective   Omeprazole-sodium Bicarbonate Other (See Comments)   Ineffective   Pantoprazole Other (See Comments)   Ineffective  Medication List     STOP taking these medications    naproxen 500 MG tablet Commonly known as: NAPROSYN       TAKE these medications    Align 4 MG Caps Take 1 capsule by mouth daily.   augmented betamethasone dipropionate 0.05 % ointment Commonly known as: DIPROLENE-AF Apply 1 Application topically as needed (skin rash).   CALCIUM PO Take 1,000 mg by mouth daily.   calcium-vitamin D 500-5 MG-MCG tablet Commonly known as: OSCAL WITH D Take 1 tablet by mouth.   Dexilant 60 MG capsule Generic drug: dexlansoprazole Take 60 mg by mouth every Monday, Wednesday, and Friday. Additonional of needed   docusate sodium 100 MG capsule Commonly known as: Colace Take 1 capsule (100 mg total) by mouth daily as needed.   escitalopram 10 MG tablet Commonly known as:  LEXAPRO Take 10 mg by mouth daily.   Fluad Quadrivalent 0.5 ML injection Generic drug: influenza vaccine adjuvanted Inject into the muscle.   methocarbamol 750 MG tablet Commonly known as: Robaxin-750 Take 1 tablet (750 mg total) by mouth 2 (two) times daily as needed for muscle spasms.   ondansetron 4 MG tablet Commonly known as: Zofran Take 1 tablet (4 mg total) by mouth every 8 (eight) hours as needed for nausea or vomiting.   oxyCODONE-acetaminophen 5-325 MG tablet Commonly known as: Percocet Take 1-2 tablets by mouth every 6 (six) hours as needed. To be taken after surgery   Pfizer COVID-19 Vac Bivalent injection Generic drug: COVID-19 mRNA bivalent vaccine (Pfizer) Inject into the muscle.   rivaroxaban 10 MG Tabs tablet Commonly known as: XARELTO Take 1 tablet (10 mg total) by mouth daily. To be taken after surgery to prevent blood clots               Durable Medical Equipment  (From admission, onward)           Start     Ordered   02/02/23 1022  DME Walker rolling  Once       Question Answer Comment  Walker: With 5 Inch Wheels   Patient needs a walker to treat with the following condition Status post left partial knee replacement      02/02/23 1022   02/02/23 1022  DME 3 n 1  Once        02/02/23 1022   02/02/23 1022  DME Bedside commode  Once       Question:  Patient needs a bedside commode to treat with the following condition  Answer:  Status post left partial knee replacement   02/02/23 1022            Diagnostic Studies: DG Knee Left Port  Result Date: 02/02/2023 CLINICAL DATA:  Postoperative left knee arthroplasty. EXAM: PORTABLE LEFT KNEE - 1-2 VIEW COMPARISON:  Left knee radiographs 12/24/2022 FINDINGS: Interval total left knee arthroplasty. No perihardware lucency is seen to indicate hardware failure or loosening. Expected postoperative changes including intra-articular and minimal subcutaneous air. Small joint effusion. No acute fracture  or dislocation. IMPRESSION: Interval total left knee arthroplasty without evidence of hardware failure. Electronically Signed   By: Neita Garnet M.D.   On: 02/02/2023 11:17    Disposition: Discharge disposition: 01-Home or Self Care          Follow-up Information     [facility] Follow up.   Why: [facility]--the home health agency will contact you for the first home visit Contact information: [address]  [place] [address] [phone]         Cristie Hem, PA-C. Schedule an appointment as soon as possible for a visit in 1 week(s).   Specialty: Orthopedic Surgery Contact information: [address] [place] [address] [phone]                  Signed: Cristie Hem 02/03/2023, 7:54 AM

## 2023-02-04 ENCOUNTER — Other Ambulatory Visit: Payer: Self-pay | Admitting: Physician Assistant

## 2023-02-04 ENCOUNTER — Telehealth: Payer: Self-pay | Admitting: Physician Assistant

## 2023-02-04 ENCOUNTER — Telehealth: Payer: Self-pay

## 2023-02-04 DIAGNOSIS — Z471 Aftercare following joint replacement surgery: Secondary | ICD-10-CM | POA: Diagnosis not present

## 2023-02-04 DIAGNOSIS — F419 Anxiety disorder, unspecified: Secondary | ICD-10-CM | POA: Diagnosis not present

## 2023-02-04 DIAGNOSIS — Z7901 Long term (current) use of anticoagulants: Secondary | ICD-10-CM | POA: Diagnosis not present

## 2023-02-04 DIAGNOSIS — E7800 Pure hypercholesterolemia, unspecified: Secondary | ICD-10-CM | POA: Diagnosis not present

## 2023-02-04 DIAGNOSIS — Z79891 Long term (current) use of opiate analgesic: Secondary | ICD-10-CM | POA: Diagnosis not present

## 2023-02-04 DIAGNOSIS — F429 Obsessive-compulsive disorder, unspecified: Secondary | ICD-10-CM | POA: Diagnosis not present

## 2023-02-04 DIAGNOSIS — K219 Gastro-esophageal reflux disease without esophagitis: Secondary | ICD-10-CM | POA: Diagnosis not present

## 2023-02-04 DIAGNOSIS — Z96652 Presence of left artificial knee joint: Secondary | ICD-10-CM | POA: Diagnosis not present

## 2023-02-04 DIAGNOSIS — N6019 Diffuse cystic mastopathy of unspecified breast: Secondary | ICD-10-CM | POA: Diagnosis not present

## 2023-02-04 MED ORDER — HYDROCODONE-ACETAMINOPHEN 5-325 MG PO TABS: 1.0000 | ORAL_TABLET | Freq: Four times a day (QID) | ORAL | 0 refills | Status: DC | PRN

## 2023-02-04 MED ORDER — PROMETHAZINE HCL 12.5 MG PO TABS: 12.5000 mg | ORAL_TABLET | Freq: Three times a day (TID) | ORAL | 0 refills | Status: DC | PRN

## 2023-02-04 NOTE — Telephone Encounter (Signed)
Ok to do

## 2023-02-04 NOTE — Telephone Encounter (Signed)
We do not manage those to medications so I would have her reach out to pcp about those two meds

## 2023-02-04 NOTE — Telephone Encounter (Signed)
Spoke with patient's husband and relayed information

## 2023-02-04 NOTE — Telephone Encounter (Signed)
Patient's husband called back. Advised him of message in chart per [place].  Voiced that he understands.

## 2023-02-04 NOTE — Telephone Encounter (Signed)
I will send in norco.  Will you make sure she was able to get the zofran?  If so and still not working, I have sent in phenergan to try but let her know it can be sedating.  Also, have her eat a few crackers or any food really prior to taking pain meds

## 2023-02-04 NOTE — Telephone Encounter (Signed)
Charrisse (PT) from [facility] called with verbal orders for 2wk 1, 3wk1, and 1wk 1. Also reporting pt nauseated today and unable to do exercises, have medication issue with lexpro and dexilant. Pt has no appetite to eat. Charrisse secure number is [phone].

## 2023-02-04 NOTE — Telephone Encounter (Signed)
Called and gave verbal okay. LMOM. Also ask her to reach out to PCP regarding medications since we do not manage Lexpapro or Dexilant.

## 2023-02-06 ENCOUNTER — Encounter (HOSPITAL_COMMUNITY): Payer: Self-pay | Admitting: Orthopaedic Surgery

## 2023-02-11 ENCOUNTER — Emergency Department (HOSPITAL_COMMUNITY): Payer: HMO

## 2023-02-11 ENCOUNTER — Emergency Department (HOSPITAL_COMMUNITY)
Admission: EM | Admit: 2023-02-11 | Discharge: 2023-02-11 | Disposition: A | Discharge status: Home / Self Care | Payer: HMO | Attending: Emergency Medicine | Admitting: Emergency Medicine

## 2023-02-11 ENCOUNTER — Encounter (HOSPITAL_COMMUNITY): Payer: Self-pay

## 2023-02-11 DIAGNOSIS — Z7901 Long term (current) use of anticoagulants: Secondary | ICD-10-CM | POA: Insufficient documentation

## 2023-02-11 DIAGNOSIS — R7989 Other specified abnormal findings of blood chemistry: Secondary | ICD-10-CM | POA: Diagnosis not present

## 2023-02-11 DIAGNOSIS — R55 Syncope and collapse: Secondary | ICD-10-CM | POA: Diagnosis not present

## 2023-02-11 DIAGNOSIS — R001 Bradycardia, unspecified: Secondary | ICD-10-CM | POA: Diagnosis not present

## 2023-02-11 LAB — CBC WITH DIFFERENTIAL/PLATELET
Abs Immature Granulocytes: 0.07 10*3/uL (ref 0.00–0.07)
Basophils Absolute: 0 10*3/uL (ref 0.0–0.1)
Basophils Relative: 0 %
Eosinophils Absolute: 0 10*3/uL (ref 0.0–0.5)
Eosinophils Relative: 0 %
HCT: 43.7 % (ref 36.0–46.0)
Hemoglobin: 14.6 g/dL (ref 12.0–15.0)
Immature Granulocytes: 1 %
Lymphocytes Relative: 16 %
Lymphs Abs: 1.5 10*3/uL (ref 0.7–4.0)
MCH: 31.5 pg (ref 26.0–34.0)
MCHC: 33.4 g/dL (ref 30.0–36.0)
MCV: 94.2 fL (ref 80.0–100.0)
Monocytes Absolute: 0.7 10*3/uL (ref 0.1–1.0)
Monocytes Relative: 7 %
Neutro Abs: 7.1 10*3/uL (ref 1.7–7.7)
Neutrophils Relative %: 76 %
Platelets: 329 10*3/uL (ref 150–400)
RBC: 4.64 MIL/uL (ref 3.87–5.11)
RDW: 15.9 % — ABNORMAL HIGH (ref 11.5–15.5)
WBC: 9.4 10*3/uL (ref 4.0–10.5)
nRBC: 0 % (ref 0.0–0.2)

## 2023-02-11 LAB — COMPREHENSIVE METABOLIC PANEL
ALT: 20 U/L (ref 0–44)
AST: 28 U/L (ref 15–41)
Albumin: 3.3 g/dL — ABNORMAL LOW (ref 3.5–5.0)
Alkaline Phosphatase: 51 U/L (ref 38–126)
Anion gap: 14 (ref 5–15)
BUN: 16 mg/dL (ref 8–23)
CO2: 14 mmol/L — ABNORMAL LOW (ref 22–32)
Calcium: 8.3 mg/dL — ABNORMAL LOW (ref 8.9–10.3)
Chloride: 109 mmol/L (ref 98–111)
Creatinine, Ser: 0.82 mg/dL (ref 0.44–1.00)
GFR, Estimated: >60 mL/min (ref >60)
Glucose, Bld: 98 mg/dL (ref 70–99)
Potassium: 4.7 mmol/L (ref 3.5–5.1)
Sodium: 137 mmol/L (ref 135–145)
Total Bilirubin: 1.7 mg/dL — ABNORMAL HIGH (ref 0.3–1.2)
Total Protein: 5.9 g/dL — ABNORMAL LOW (ref 6.5–8.1)

## 2023-02-11 LAB — URINALYSIS, ROUTINE W REFLEX MICROSCOPIC
Bacteria, UA: NONE SEEN
Bilirubin Urine: NEGATIVE
Glucose, UA: NEGATIVE mg/dL
Hgb urine dipstick: NEGATIVE
Ketones, ur: NEGATIVE mg/dL
Leukocytes,Ua: NEGATIVE
Nitrite: NEGATIVE
Protein, ur: NEGATIVE mg/dL
Specific Gravity, Urine: 1.004 — ABNORMAL LOW (ref 1.005–1.030)
pH: 6 (ref 5.0–8.0)

## 2023-02-11 LAB — D-DIMER, QUANTITATIVE: D-Dimer, Quant: 1.6 ug/mL-FEU — ABNORMAL HIGH (ref 0.00–0.50)

## 2023-02-11 LAB — TROPONIN I (HIGH SENSITIVITY): Troponin I (High Sensitivity): 7 ng/L (ref <18)

## 2023-02-11 LAB — CBG MONITORING, ED: Glucose-Capillary: 91 mg/dL (ref 70–99)

## 2023-02-11 MED ORDER — SODIUM CHLORIDE 0.9 % IV BOLUS
1000.0000 mL | Freq: Once | INTRAVENOUS | Status: AC
  Administered 2023-02-11: 1000 mL via INTRAVENOUS

## 2023-02-11 MED ORDER — SODIUM CHLORIDE 0.9 % IV SOLN: INTRAVENOUS | Status: DC

## 2023-02-11 MED ORDER — IOHEXOL 350 MG/ML SOLN
75.0000 mL | Freq: Once | INTRAVENOUS | Status: AC | PRN
  Administered 2023-02-11: 75 mL via INTRAVENOUS

## 2023-02-11 MED ORDER — IOHEXOL 300 MG/ML  SOLN: 75.0000 mL | Freq: Once | INTRAMUSCULAR | Status: DC | PRN

## 2023-02-11 NOTE — Discharge Instructions (Signed)
Eat and drink as well as you can for the next couple of days.  Please follow-up with your family doctor in the office.  Return for chest pain difficulty breathing headache or neck pain.

## 2023-02-11 NOTE — ED Provider Notes (Signed)
[place] EMERGENCY DEPARTMENT AT [facility] Provider Note   CSN: [id number] Arrival date & time: 02/11/23  4098     History  Chief Complaint  Patient presents with   Loss of Consciousness    Ashley Blair is a 67 y.o. female.  Pt is a 67 yo female with pmhx significant for ocd, gerd, hld, and arthritis.  Pt had a total left knee replacement on 7/1.  She has had a lot of n/v with the pain meds.  She was in the shower this am and felt hot and felt her HR go up.  Her husband was helping her b/c of the recent surgery.  He lowered her to a bench that was in the shower and she passed out.  She did not fall.  BP low (70s-80s) when EMS arrived.  She denies any pain.       Home Medications Prior to Admission medications   Medication Sig Start Date End Date Taking? Authorizing Provider  docusate sodium (COLACE) 100 MG capsule Take 1 capsule (100 mg total) by mouth daily as needed. 01/26/23 01/26/24  Cristie Hem, PA-C  methocarbamol (ROBAXIN-750) 750 MG tablet Take 1 tablet (750 mg total) by mouth 2 (two) times daily as needed for muscle spasms. 01/26/23   Cristie Hem, PA-C  ondansetron (ZOFRAN) 4 MG tablet Take 1 tablet (4 mg total) by mouth every 8 (eight) hours as needed for nausea or vomiting. 01/26/23   Cristie Hem, PA-C  oxyCODONE-acetaminophen (PERCOCET) 5-325 MG tablet Take 1-2 tablets by mouth every 6 (six) hours as needed. To be taken after surgery 01/26/23   Cristie Hem, PA-C  rivaroxaban (XARELTO) 10 MG TABS tablet Take 1 tablet (10 mg total) by mouth daily. To be taken after surgery to prevent blood clots 01/26/23   Cristie Hem, PA-C  augmented betamethasone dipropionate (DIPROLENE-AF) 0.05 % ointment Apply 1 Application topically as needed (skin rash).    [provider]  CALCIUM PO Take 1,000 mg by mouth daily.    [provider]  calcium-vitamin D (OSCAL WITH D) 500-5 MG-MCG tablet Take 1 tablet by mouth.    [provider]  COVID-19 mRNA bivalent vaccine, Pfizer, (PFIZER COVID-19 VAC BIVALENT) injection Inject into the muscle. 12/05/21   Judyann Munson, MD  DEXILANT 60 MG capsule Take 60 mg by mouth every Monday, Wednesday, and Friday. Additonional of needed 12/19/20   [provider]  escitalopram (LEXAPRO) 10 MG tablet Take 10 mg by mouth daily.    [provider]  HYDROcodone-acetaminophen (NORCO) 5-325 MG tablet Take 1-2 tablets by mouth every 6 (six) hours as needed. 02/04/23   Cristie Hem, PA-C  influenza vaccine adjuvanted (FLUAD) 0.5 ML injection Inject into the muscle. 04/30/22   Judyann Munson, MD  Probiotic Product (ALIGN) 4 MG CAPS Take 1 capsule by mouth daily.    [provider]  promethazine (PHENERGAN) 12.5 MG tablet Take 1 tablet (12.5 mg total) by mouth every 8 (eight) hours as needed for nausea or vomiting. 02/04/23   Cristie Hem, PA-C      Allergies    Aspirin, Cortisone, Esomeprazole, Lansoprazole, Omeprazole, Omeprazole-sodium bicarbonate, and Pantoprazole    Review of Systems   Review of Systems  Neurological:  Positive for syncope.  All other systems reviewed and are negative.   Physical Exam Updated Vital Signs BP 135/81   Pulse 63   Temp 97.9 F (36.6 C) (Oral)   Resp 20  Ht 5\' 3"  (1.6 m)   Wt 65.7 kg   LMP 08/04/2002 (Approximate)   SpO2 100%   BMI 25.67 kg/m  Physical Exam Vitals and nursing note reviewed.  Constitutional:      Appearance: Normal appearance.  HENT:     Head: Normocephalic and atraumatic.     Right Ear: External ear normal.     Left Ear: External ear normal.     Nose: Nose normal.     Mouth/Throat:     Mouth: Mucous membranes are moist.     Pharynx: Oropharynx is clear.  Eyes:     Extraocular Movements: Extraocular movements intact.     Conjunctiva/sclera: Conjunctivae normal.     Pupils: Pupils are equal, round, and reactive to light.  Cardiovascular:     Rate and Rhythm: Normal rate and regular  rhythm.     Pulses: Normal pulses.     Heart sounds: Normal heart sounds.  Pulmonary:     Effort: Pulmonary effort is normal.     Breath sounds: Normal breath sounds.  Abdominal:     General: Abdomen is flat. Bowel sounds are normal.     Palpations: Abdomen is soft.  Musculoskeletal:     Cervical back: Normal range of motion and neck supple.     Comments: Left knee mildly swollen c/w recent surgery.  No redness.    Skin:    General: Skin is warm.     Capillary Refill: Capillary refill takes less than 2 seconds.  Neurological:     General: No focal deficit present.     Mental Status: She is alert and oriented to person, place, and time.  Psychiatric:        Mood and Affect: Mood normal.        Behavior: Behavior normal.     ED Results / Procedures / Treatments   Labs (all labs ordered are listed, but only abnormal results are displayed) Labs Reviewed  CBC WITH DIFFERENTIAL/PLATELET - Abnormal; Notable for the following components:      Result Value   RDW 15.9 (*)    All other components within normal limits  URINALYSIS, ROUTINE W REFLEX MICROSCOPIC - Abnormal; Notable for the following components:   Color, Urine STRAW (*)    Specific Gravity, Urine 1.004 (*)    All other components within normal limits  COMPREHENSIVE METABOLIC PANEL - Abnormal; Notable for the following components:   CO2 14 (*)    Calcium 8.3 (*)    Total Protein 5.9 (*)    Albumin 3.3 (*)    Total Bilirubin 1.7 (*)    All other components within normal limits  D-DIMER, QUANTITATIVE (NOT AT [facility]) - Abnormal; Notable for the following components:   D-Dimer, Quant 1.60 (*)    All other components within normal limits  CBG MONITORING, ED  TROPONIN I (HIGH SENSITIVITY)  TROPONIN I (HIGH SENSITIVITY)    EKG EKG Interpretation Date/Time:  Wednesday February 11 2023 09:37:12 EDT Ventricular Rate:  67 PR Interval:  154 QRS Duration:  95 QT Interval:  444 QTC Calculation: 469 R Axis:   28  Text  Interpretation: Sinus rhythm Probable anteroseptal infarct, old No old tracing to compare Confirmed by Jacalyn Lefevre [phone]) on 02/11/2023 9:46:19 AM  Radiology CT Angio Chest PE W and/or Wo Contrast  Result Date: 02/11/2023 CLINICAL DATA:  Pulmonary embolism (PE) suspected, high prob. EXAM: CT ANGIOGRAPHY CHEST WITH CONTRAST TECHNIQUE: Multidetector CT imaging of the chest was performed using the standard protocol during  bolus administration of intravenous contrast. Multiplanar CT image reconstructions and MIPs were obtained to evaluate the vascular anatomy. RADIATION DOSE REDUCTION: This exam was performed according to the departmental dose-optimization program which includes automated exposure control, adjustment of the mA and/or kV according to patient size and/or use of iterative reconstruction technique. CONTRAST:  75mL OMNIPAQUE IOHEXOL 350 MG/ML SOLN COMPARISON:  Cardiac CT 10/02/2021. FINDINGS: Cardiovascular: Satisfactory opacification of the pulmonary arteries to the segmental level. No evidence of pulmonary embolism. Normal heart size. No pericardial effusion. Mediastinum/Nodes: No enlarged mediastinal, hilar, or axillary lymph nodes. Thyroid gland, trachea, and esophagus demonstrate no significant findings. Lungs/Pleura: Lungs are clear. No pleural effusion or pneumothorax. Upper Abdomen: No acute abnormality. Musculoskeletal: No chest wall abnormality. No acute or significant osseous findings. Review of the MIP images confirms the above findings. IMPRESSION: No evidence of pulmonary embolism or other acute findings in the chest. Electronically Signed   By: Orvan Falconer M.D.   On: 02/11/2023 15:59   DG Chest Port 1 View  Result Date: 02/11/2023 CLINICAL DATA:  Syncope EXAM: PORTABLE CHEST 1 VIEW COMPARISON:  CT cardiac coronary artery calcium score October 02, 2021 FINDINGS: The heart size and mediastinal contours are within normal limits. Diffuse interstitial prominence. No focal pulmonary  opacity. No pleural effusion or pneumothorax. The visualized upper abdomen is unremarkable. No acute osseous abnormality. IMPRESSION: No acute cardiopulmonary abnormality. Electronically Signed   By: Jacob Moores M.D.   On: 02/11/2023 10:32    Procedures Procedures    Medications Ordered in ED Medications  sodium chloride 0.9 % bolus 1,000 mL (0 mLs Intravenous Stopped 02/11/23 1115)    And  0.9 %  sodium chloride infusion ( Intravenous New Bag/Given 02/11/23 1115)  iohexol (OMNIPAQUE) 300 MG/ML solution 75 mL (has no administration in time range)  sodium chloride 0.9 % bolus 1,000 mL (0 mLs Intravenous Stopped 02/11/23 1511)  iohexol (OMNIPAQUE) 350 MG/ML injection 75 mL (75 mLs Intravenous Contrast Given 02/11/23 1536)    ED Course/ Medical Decision Making/ A&P                             Medical Decision Making Amount and/or Complexity of Data Reviewed Labs: ordered. Radiology: ordered.  Risk Prescription drug management.   This patient presents to the ED for concern of syncope, this involves an extensive number of treatment options, and is a complaint that carries with it a high risk of complications and morbidity.  The differential diagnosis includes dehydration, vasovagal, cardiogenic, PE   Co morbidities that complicate the patient evaluation  ocd, gerd, hld, and arthritis   Additional history obtained:  Additional history obtained from epic chart review External records from outside source obtained and reviewed including EMS report/husband   Lab Tests:  I Ordered, and personally interpreted labs.  The pertinent results include:  cbc nl, cmp nl, ddimer elevated at 1.6   Imaging Studies ordered:  I ordered imaging studies including cxr and CT angio I independently visualized and interpreted imaging which showed  CXR:  No acute cardiopulmonary abnormality.  CTA: No evidence of pulmonary embolism or other acute findings in the  chest.   I agree with the  radiologist interpretation   Cardiac Monitoring:  The patient was maintained on a cardiac monitor.  I personally viewed and interpreted the cardiac monitored which showed an underlying rhythm of: nsr   Medicines ordered and prescription drug management:  I ordered medication including ivfs  for  syncope  Reevaluation of the patient after these medicines showed that the patient improved I have reviewed the patients home medicines and have made adjustments as needed   Test Considered:  ct   Critical Interventions:  ivfs   Problem List / ED Course:  Syncope:  likely due to dehydration.  Pt looks much better after fluids.  She is stable for d/c.  Return if worse. F/u with pcp.   Reevaluation:  After the interventions noted above, I reevaluated the patient and found that they have :improved   Social Determinants of Health:  Lives at home   Dispostion:  After consideration of the diagnostic results and the patients response to treatment, I feel that the patent would benefit from discharge with outpatient f/u.          Final Clinical Impression(s) / ED Diagnoses Final diagnoses:  Syncope and collapse    Rx / DC Orders ED Discharge Orders     None         Jacalyn Lefevre, MD 02/11/23 1611

## 2023-02-11 NOTE — Progress Notes (Signed)
I went by to see Ashley Blair in the ER today. Her surgical bandages are clean, dry, and intact. She has no calf tenderness and negative homan sign. Her are stable. She states that she is feeling better since getting fluids. Other lab lab work And here analysis pending. She is stable from an orthopedic standpoint. They have my cell phone number if they need anything. They know that I am going out of town this afternoon but available by phone.

## 2023-02-11 NOTE — ED Notes (Signed)
Pt gone to CT 

## 2023-02-11 NOTE — ED Provider Notes (Signed)
Received patient in turnover from Dr. Particia Nearing.  Please see their note for further details of Hx, PE.  Briefly patient is a 67 y.o. female with a Loss of Consciousness .  Patient is recently postop from knee surgery had a syncopal event while in the shower.  Feeling better after IV fluids.  D-dimer was positive awaiting CT angiogram of the chest.  CT scan is negative for pulmonary embolism.  Will discharge home.  PCP follow-up.    Melene Plan, DO 02/11/23 [phone]

## 2023-02-11 NOTE — ED Triage Notes (Signed)
Pt was in the shower this morning and felt like her hr increased and she felt flushed. Pt was lowered to chair by husband . Pt recently had left knee replacement.  Hr 64 Bp 120/70 Cbg 194

## 2023-02-12 ENCOUNTER — Telehealth: Payer: Self-pay | Admitting: *Deleted

## 2023-02-17 ENCOUNTER — Telehealth: Payer: Self-pay | Admitting: *Deleted

## 2023-02-17 ENCOUNTER — Ambulatory Visit: Payer: HMO | Admitting: Physician Assistant

## 2023-02-17 DIAGNOSIS — Z96652 Presence of left artificial knee joint: Secondary | ICD-10-CM

## 2023-02-17 MED ORDER — TRAMADOL HCL 50 MG PO TABS: 50.0000 mg | ORAL_TABLET | Freq: Four times a day (QID) | ORAL | 2 refills | Status: DC | PRN

## 2023-02-17 NOTE — Progress Notes (Signed)
Post-Op Visit Note   Patient: Ashley Blair           Date of Birth: September 22, 1955           MRN: [id number] Visit Date: 02/17/2023 PCP: Thana Ates, MD   Assessment & Plan:  Chief Complaint:  Chief Complaint  Patient presents with   Left Knee - Routine Post Op   Visit Diagnoses:  1. Status post total left knee replacement     Plan: Patient is a pleasant 67 year old female who comes in today 2 weeks status post left total knee replacement 02/02/2023.  She has been relatively fatigued.  She was seen in the ED last week where she was treated for dehydration.  At that time, she had vomited for 2 days and had been drinking plain water.  She is no longer vomiting but does feel some nausea in the morning.  She is now eating which is also helping.  She is only taking Tylenol and Robaxin for pain.  She has been compliant taking Xarelto for DVT prophylaxis.  She is currently getting home health physical therapy and is ambulating with a walker.  Examination of the right knee reveals a healing surgical incision with nylon sutures in place.  No evidence of infection or cellulitis.  Calves are soft nontender.  She is neurovascularly intact distally.  At this point, she will continue taking her Xarelto.  She is scheduled to start outpatient physical therapy this Thursday.  I sent in tramadol as I think this will help more than Tylenol but would not be as strong as the hydrocodone.  She will follow-up with [place] in 4 weeks for repeat evaluation and 2 view x-rays of the left knee.  Call with concerns or questions.  Follow-Up Instructions: Return in about 4 weeks (around 03/17/2023).   Orders:  No orders of the defined types were placed in this encounter.  Meds ordered this encounter  Medications   traMADol (ULTRAM) 50 MG tablet    Sig: Take 1 tablet (50 mg total) by mouth every 6 (six) hours as needed.    Dispense:  30 tablet    Refill:  2    Imaging: No new imaging  PMFS History: Patient Active  Problem List   Diagnosis Date Noted   Status post total left knee replacement 02/02/2023   Primary osteoarthritis of left knee 02/01/2023   Family history of breast cancer in mother 12/12/2013   H/O diethylstilbestrol (DES) exposure in utero 12/12/2013   Past Medical History:  Diagnosis Date   Anxiety    Arthritis    Elevated hemoglobin A1c 2019   5.7   Family history of DES exposure    Fibrocystic breast    GERD (gastroesophageal reflux disease)    HPV (human papilloma virus) infection    Hypercholesterolemia    OCD (obsessive compulsive disorder)    Pre-diabetes    S/P excision of lipoma     Family History  Problem Relation Age of Onset   Breast cancer Mother    Osteoporosis Maternal Grandmother    Stroke Maternal Grandfather     Past Surgical History:  Procedure Laterality Date   ANKLE SURGERY     broken as a child   BREAST BIOPSY Right    COLONOSCOPY     DILATION AND CURETTAGE OF UTERUS     LIPOMA EXCISION     rib cage   TOTAL KNEE ARTHROPLASTY Left 02/02/2023   Procedure: LEFT TOTAL KNEE ARTHROPLASTY;  Surgeon: Roda Shutters,  Edwin Cap, MD;  Location: [facility] OR;  Service: Orthopedics;  Laterality: Left;   TRIGGER FINGER RELEASE Left 03/2013   thumb   Social History   Occupational History   Not on file  Tobacco Use   Smoking status: Former    Current packs/day: 0.00    Types: Cigarettes    Quit date: 12/07/1975    Years since quitting: 47.2   Smokeless tobacco: Never  Vaping Use   Vaping status: Never Used  Substance and Sexual Activity   Alcohol use: Not Currently    Comment: rarely   Drug use: No   Sexual activity: Not Currently    Partners: Male    Birth control/protection: Post-menopausal

## 2023-02-17 NOTE — Telephone Encounter (Signed)
Error in chart

## 2023-02-19 ENCOUNTER — Encounter (HOSPITAL_BASED_OUTPATIENT_CLINIC_OR_DEPARTMENT_OTHER): Payer: Self-pay | Admitting: Physical Therapy

## 2023-02-19 ENCOUNTER — Ambulatory Visit (HOSPITAL_BASED_OUTPATIENT_CLINIC_OR_DEPARTMENT_OTHER): Payer: HMO | Attending: Orthopaedic Surgery | Admitting: Physical Therapy

## 2023-02-19 ENCOUNTER — Other Ambulatory Visit: Payer: Self-pay

## 2023-02-19 DIAGNOSIS — R262 Difficulty in walking, not elsewhere classified: Secondary | ICD-10-CM | POA: Diagnosis not present

## 2023-02-19 DIAGNOSIS — M25562 Pain in left knee: Secondary | ICD-10-CM

## 2023-02-19 DIAGNOSIS — M1712 Unilateral primary osteoarthritis, left knee: Secondary | ICD-10-CM | POA: Diagnosis not present

## 2023-02-19 DIAGNOSIS — M6281 Muscle weakness (generalized): Secondary | ICD-10-CM

## 2023-02-19 NOTE — Therapy (Signed)
OUTPATIENT PHYSICAL THERAPY LOWER EXTREMITY EVALUATION   Patient Name: Ashley Blair MRN: [id number] DOB:[birth year], 67 y.o., female Today's Date: 02/19/2023  END OF SESSION:  PT End of Session - 02/19/23 0858     Visit Number 1    Number of Visits 18    Date for PT Re-Evaluation 05/20/23    Authorization Type HTA    PT Start Time 0845    PT Stop Time 0930    PT Time Calculation (min) 45 min    Activity Tolerance Patient tolerated treatment well;Patient limited by pain    Behavior During Therapy [facility] for tasks assessed/performed;Flat affect             Past Medical History:  Diagnosis Date   Anxiety    Arthritis    Elevated hemoglobin A1c 2019   5.7   Family history of DES exposure    Fibrocystic breast    GERD (gastroesophageal reflux disease)    HPV (human papilloma virus) infection    Hypercholesterolemia    OCD (obsessive compulsive disorder)    Pre-diabetes    S/P excision of lipoma    Past Surgical History:  Procedure Laterality Date   ANKLE SURGERY     broken as a child   BREAST BIOPSY Right    COLONOSCOPY     DILATION AND CURETTAGE OF UTERUS     LIPOMA EXCISION     rib cage   TOTAL KNEE ARTHROPLASTY Left 02/02/2023   Procedure: LEFT TOTAL KNEE ARTHROPLASTY;  Surgeon: Tarry Kos, MD;  Location: [facility] OR;  Service: Orthopedics;  Laterality: Left;   TRIGGER FINGER RELEASE Left 03/2013   thumb   Patient Active Problem List   Diagnosis Date Noted   Status post total left knee replacement 02/02/2023   Primary osteoarthritis of left knee 02/01/2023   Family history of breast cancer in mother 12/12/2013   H/O diethylstilbestrol (DES) exposure in utero 12/12/2013    PCP:  Thana Ates, MD       REFERRING PROVIDER:  Tarry Kos, MD     REFERRING DIAG:  [phone] (ICD-10-CM) - Primary osteoarthritis of left knee      THERAPY DIAG:  Acute pain of left knee  Difficulty walking  Muscle weakness (generalized)  Rationale for Evaluation and  Treatment: Rehabilitation  ONSET DATE: 02/02/23 DOS  Operative procedure: Left total knee arthroplasty.   SUBJECTIVE:   SUBJECTIVE STATEMENT: Pt reports ED visit was related to what she believes was too much pain medication which caused vomiting and nausea. She feels she is more normal now at this point. Pt did have HHPT for 5 sessions over 2 wks. Has weaned off of AD's totally. Has cane but does not use. Ices 3-4x day 20 mins. Pt is able to manage stairs with UE support.   PERTINENT HISTORY: N/A PAIN:  Are you having pain? no: NPRS scale: 0/10 Pain location: posterior knee anterior knee  Pain description: swelling/full Aggravating factors: bending Relieving factors: N/A, ice/rest   PRECAUTIONS: Knee  RED FLAGS: None   WEIGHT BEARING RESTRICTIONS: No  FALLS:  Has patient fallen in last 6 months? No  LIVING ENVIRONMENT: Lives with: lives with their family and lives with their spouse Lives in: House/apartment Stairs: Yes Has following equipment at home: Single point cane  OCCUPATION: fitness Secondary school teacher; Office manager  PLOF: Independent  PATIENT GOALS: return to normal exercise    OBJECTIVE:   DIAGNOSTIC FINDINGS: N/A  PATIENT SURVEYS:  FOTO 39 60 @ DC 12 MCII  COGNITION: Overall cognitive status: Within functional limits for tasks assessed     SENSATION: WFL  EDEMA:  Moderate, palpable edema anteriorly and posteriorly into popliteal fossa  POSTURE: No Significant postural limitations  PALPATION: TTP of medial and lateral knee; popliteal space; calf supple  LOWER EXTREMITY ROM:  Active ROM Right eval Left eval  Hip flexion    Hip extension    Hip abduction    Hip adduction    Hip internal rotation    Hip external rotation    Knee flexion 125 75  Knee extension 3 -12  Ankle dorsiflexion    Ankle plantarflexion    Ankle inversion    Ankle eversion     (Blank rows = not tested)  LOWER EXTREMITY MMT:  MMT Right eval Left eval  Hip  flexion    Hip extension    Hip abduction    Hip adduction    Hip internal rotation    Hip external rotation    Knee flexion 4+/5 4-/5  Knee extension 5/5 4-/5  Ankle dorsiflexion    Ankle plantarflexion    Ankle inversion    Ankle eversion     (Blank rows = not tested)   Gait: flexed knee gait on L without AD on level ground; able to perform step to pattern on stairs with single UE and no AD; supervision    TODAY'S TREATMENT:    Program Notes walking 2-3x a day  Exercises - Active Straight Leg Raise with Quad Set  - 2-3 x daily - 7 x weekly - 3 sets - 10 reps - Long Sitting Quad Set with Towel Roll Under Heel  - 2-3 x daily - 7 x weekly - 3 sets - 10 reps - Seated Knee Flexion Extension AAROM with Overpressure  - 2-3 x daily - 7 x weekly - 1 sets - 10 reps - 10 hold - Gastroc Stretch on Wall  - 2-3 x daily - 7 x weekly - 1 sets - 3 reps - 30 hold   PATIENT EDUCATION:  Education details:  surgical precautions, AD usage, gait mechanics, prognosis, anatomy, exercise progression, DOMS expectations, muscle firing,  envelope of function, HEP, POC  Person educated: Patient Education method: Explanation, Demonstration, Tactile cues, Verbal cues, and Handouts Education comprehension: verbalized understanding, returned demonstration, verbal cues required, tactile cues required, and needs further education     HOME EXERCISE PROGRAM:  Access Code: [email or web address] URL: [email or web address] Date: 02/19/2023 Prepared by: Zebedee Iba  ASSESSMENT:   CLINICAL IMPRESSION: Patient is a 67 y.o. female who was seen today for physical therapy evaluation and treatment for s/p L TKA. Pt with expected ROM and gait limitations. Pt's swelling management is largest priority moving forward due to effects on ROM and pain. Pt is safe with gait without AD at this time and does not show signs of knee buckling. Plan to continue with L knee ROM and quad activiation at future sessions. Pt would  benefit from continued skilled therapy in order to reach goals and maximize functional L LE strength and ROM for full return to PLOF.     OBJECTIVE IMPAIRMENTS decreased ROM, decreased strength, hypomobility, increased fascial restrictions, increased muscle spasms, impaired flexibility, improper body mechanics, postural dysfunction, and pain.    ACTIVITY LIMITATIONS carrying, lifting, sitting, stairs, locomotion level   PARTICIPATION LIMITATIONS: driving, shopping, community activity, occupation, and yard work   PERSONAL FACTORS Age, Time since onset of injury/illness/exacerbation, and 1 comorbidity:    are also affecting patient's functional outcome.  GOALS:   SHORT TERM GOALS: Target date: 04/02/2023     Pt will become independent with HEP in order to demonstrate synthesis of PT education.  Goal status: INITIAL   2.  Pt will be able to demonstrate at least 110 deg knee flexion  in order to demonstrate functional improvement in LE function for self-care and house hold duties.    Goal status: INITIAL   3.  Pt will score at least 12 pt increase on FOTO to demonstrate functional improvement in MCII and pt perceived function.     Goal status: INITIAL       LONG TERM GOALS: Target date: 05/20/2023     Pt  will become independent with final HEP in order to demonstrate synthesis of PT education.    Goal status: INITIAL   2.  Pt will be able to demonstrate/report ability to walk >30 mins without pain in order to demonstrate functional improvement and tolerance to exercise and community mobility.    Goal status: INITIAL   3.  Pt will be able to demonstrate full depth squat in order to demonstrate functional improvement in LE function for self-care and house hold duties.  INITIAL  4.  Pt will score >/= 60 on FOTO to demonstrate improvement in perceived L knee function.  Baseline:  Goal status: INITIAL  5.  Pt will be able to demonstrate full L knee ROM in order to  demonstrate functional improvement in LE function for self-care and house hold duties.  INITIAL      PLAN: PT FREQUENCY: 1-2x/week   PT DURATION: 12 weeks    PLANNED INTERVENTIONS: Therapeutic exercises, Therapeutic activity, Neuromuscular re-education, Balance training, Gait training, Patient/Family education, Self Care, Joint mobilization, Joint manipulation, Orthotic/Fit training, Aquatic Therapy, Dry Needling, Electrical stimulation, Spinal manipulation, Spinal mobilization, Cryotherapy, Moist heat, Compression bandaging, scar mobilization, Splintting, Taping, Vasopneumatic device, Traction, Ultrasound, Ionotophoresis 4mg /ml Dexamethasone, Manual therapy, and Re-evaluation   PLAN FOR NEXT SESSION: TKA rehab protocol   Zebedee Iba PT, DPT 02/19/23 1:13 PM

## 2023-02-20 DIAGNOSIS — Z96652 Presence of left artificial knee joint: Secondary | ICD-10-CM | POA: Diagnosis not present

## 2023-02-20 DIAGNOSIS — Z87898 Personal history of other specified conditions: Secondary | ICD-10-CM | POA: Diagnosis not present

## 2023-02-20 DIAGNOSIS — E860 Dehydration: Secondary | ICD-10-CM | POA: Diagnosis not present

## 2023-02-21 ENCOUNTER — Ambulatory Visit (HOSPITAL_BASED_OUTPATIENT_CLINIC_OR_DEPARTMENT_OTHER): Payer: HMO

## 2023-02-21 ENCOUNTER — Encounter (HOSPITAL_BASED_OUTPATIENT_CLINIC_OR_DEPARTMENT_OTHER): Payer: Self-pay

## 2023-02-21 DIAGNOSIS — M25562 Pain in left knee: Secondary | ICD-10-CM

## 2023-02-21 DIAGNOSIS — R262 Difficulty in walking, not elsewhere classified: Secondary | ICD-10-CM

## 2023-02-21 DIAGNOSIS — M6281 Muscle weakness (generalized): Secondary | ICD-10-CM

## 2023-02-21 NOTE — Therapy (Signed)
OUTPATIENT PHYSICAL THERAPY LOWER EXTREMITY EVALUATION   Patient Name: Ashley Blair MRN: [id number] DOB:07/22/1956, 67 y.o., female Today's Date: 02/21/2023  END OF SESSION:  PT End of Session - 02/21/23 1107     Visit Number 2    Number of Visits 18    Date for PT Re-Evaluation 05/20/23    Authorization Type HTA    PT Start Time 1047    PT Stop Time 1127    PT Time Calculation (min) 40 min    Activity Tolerance Patient tolerated treatment well    Behavior During Therapy WFL for tasks assessed/performed              Past Medical History:  Diagnosis Date   Anxiety    Arthritis    Elevated hemoglobin A1c 2019   5.7   Family history of DES exposure    Fibrocystic breast    GERD (gastroesophageal reflux disease)    HPV (human papilloma virus) infection    Hypercholesterolemia    OCD (obsessive compulsive disorder)    Pre-diabetes    S/P excision of lipoma    Past Surgical History:  Procedure Laterality Date   ANKLE SURGERY     broken as a child   BREAST BIOPSY Right    COLONOSCOPY     DILATION AND CURETTAGE OF UTERUS     LIPOMA EXCISION     rib cage   TOTAL KNEE ARTHROPLASTY Left 02/02/2023   Procedure: LEFT TOTAL KNEE ARTHROPLASTY;  Surgeon: Tarry Kos, MD;  Location: [facility] OR;  Service: Orthopedics;  Laterality: Left;   TRIGGER FINGER RELEASE Left 03/2013   thumb   Patient Active Problem List   Diagnosis Date Noted   Status post total left knee replacement 02/02/2023   Primary osteoarthritis of left knee 02/01/2023   Family history of breast cancer in mother 12/12/2013   H/O diethylstilbestrol (DES) exposure in utero 12/12/2013    PCP:  Thana Ates, MD       REFERRING PROVIDER:  Tarry Kos, MD     REFERRING DIAG:  [phone] (ICD-10-CM) - Primary osteoarthritis of left knee      THERAPY DIAG:  Acute pain of left knee  Difficulty walking  Muscle weakness (generalized)  Rationale for Evaluation and Treatment:  Rehabilitation  ONSET DATE: 02/02/23 DOS  Operative procedure: Left total knee arthroplasty.   SUBJECTIVE:   SUBJECTIVE STATEMENT: Pt reports increased stiffness since she has been increasing her activity. "The straight leg stretch really hurts."  Pt is trying to take Tramadol as recommended by PCP.   PERTINENT HISTORY: N/A PAIN:  Are you having pain? no: NPRS scale: 0/10 Pain location: posterior knee anterior knee  Pain description: swelling/full Aggravating factors: bending Relieving factors: N/A, ice/rest   PRECAUTIONS: Knee  RED FLAGS: None   WEIGHT BEARING RESTRICTIONS: No  FALLS:  Has patient fallen in last 6 months? No  LIVING ENVIRONMENT: Lives with: lives with their family and lives with their spouse Lives in: House/apartment Stairs: Yes Has following equipment at home: Single point cane  OCCUPATION: Marketing executive; Office manager  PLOF: Independent  PATIENT GOALS: return to normal exercise    OBJECTIVE:   DIAGNOSTIC FINDINGS: N/A  PATIENT SURVEYS:  FOTO 39 60 @ DC 12 MCII  COGNITION: Overall cognitive status: Within functional limits for tasks assessed     SENSATION: WFL  EDEMA:  Moderate, palpable edema anteriorly and posteriorly into popliteal fossa  POSTURE: No Significant postural limitations  PALPATION: TTP of medial and  lateral knee; popliteal space; calf supple  LOWER EXTREMITY ROM:  Active ROM Right eval Left eval L 7/20  Hip flexion     Hip extension     Hip abduction     Hip adduction     Hip internal rotation     Hip external rotation     Knee flexion 125 75 78  Knee extension 3 -12 -5  Ankle dorsiflexion     Ankle plantarflexion     Ankle inversion     Ankle eversion      (Blank rows = not tested)  LOWER EXTREMITY MMT:  MMT Right eval Left eval  Hip flexion    Hip extension    Hip abduction    Hip adduction    Hip internal rotation    Hip external rotation    Knee flexion 4+/5 4-/5  Knee  extension 5/5 4-/5  Ankle dorsiflexion    Ankle plantarflexion    Ankle inversion    Ankle eversion     (Blank rows = not tested)   Gait: flexed knee gait on L without AD on level ground; able to perform step to pattern on stairs with single UE and no AD; supervision    TODAY'S TREATMENT:    PROM L knee LAQ 2.5# 2x10 5s hold Standing march 2x10 Standing L knee flexion x10 Partial squats x10 Heel/toe raises x20 Supine SLR x10 Bridge x10 Sci fit bike partial revs x4 min     PATIENT EDUCATION:  Education details:  surgical precautions, AD usage, gait mechanics, prognosis, anatomy, exercise progression, DOMS expectations, muscle firing,  envelope of function, HEP, POC  Person educated: Patient Education method: Explanation, Demonstration, Tactile cues, Verbal cues, and Handouts Education comprehension: verbalized understanding, returned demonstration, verbal cues required, tactile cues required, and needs further education     HOME EXERCISE PROGRAM:  Access Code: [email or web address] URL: [email or web address] Date: 02/19/2023 Prepared by: Zebedee Iba  ASSESSMENT:   CLINICAL IMPRESSION: Excellent improvement in knee extension today compared to IE. She remains limited in knee flexion. Reviewed home exercises and frequency to help improve this. Able to progress standing exercises today with overall good tolerance.  Educated about DOMS and use of ice. Will continue to progress as tolerated.    OBJECTIVE IMPAIRMENTS decreased ROM, decreased strength, hypomobility, increased fascial restrictions, increased muscle spasms, impaired flexibility, improper body mechanics, postural dysfunction, and pain.    ACTIVITY LIMITATIONS carrying, lifting, sitting, stairs, locomotion level   PARTICIPATION LIMITATIONS: driving, shopping, community activity, occupation, and yard work   PERSONAL FACTORS Age, Time since onset of injury/illness/exacerbation, and 1 comorbidity:    are also  affecting patient's functional outcome.        GOALS:   SHORT TERM GOALS: Target date: 04/02/2023     Pt will become independent with HEP in order to demonstrate synthesis of PT education.  Goal status: INITIAL   2.  Pt will be able to demonstrate at least 110 deg knee flexion  in order to demonstrate functional improvement in LE function for self-care and house hold duties.    Goal status: INITIAL   3.  Pt will score at least 12 pt increase on FOTO to demonstrate functional improvement in MCII and pt perceived function.     Goal status: INITIAL       LONG TERM GOALS: Target date: 05/20/2023     Pt  will become independent with final HEP in order to demonstrate synthesis of PT education.    Goal status: INITIAL  2.  Pt will be able to demonstrate/report ability to walk >30 mins without pain in order to demonstrate functional improvement and tolerance to exercise and community mobility.    Goal status: INITIAL   3.  Pt will be able to demonstrate full depth squat in order to demonstrate functional improvement in LE function for self-care and house hold duties.  INITIAL  4.  Pt will score >/= 60 on FOTO to demonstrate improvement in perceived L knee function.  Baseline:  Goal status: INITIAL  5.  Pt will be able to demonstrate full L knee ROM in order to demonstrate functional improvement in LE function for self-care and house hold duties.  INITIAL      PLAN: PT FREQUENCY: 1-2x/week   PT DURATION: 12 weeks    PLANNED INTERVENTIONS: Therapeutic exercises, Therapeutic activity, Neuromuscular re-education, Balance training, Gait training, Patient/Family education, Self Care, Joint mobilization, Joint manipulation, Orthotic/Fit training, Aquatic Therapy, Dry Needling, Electrical stimulation, Spinal manipulation, Spinal mobilization, Cryotherapy, Moist heat, Compression bandaging, scar mobilization, Splintting, Taping, Vasopneumatic device, Traction, Ultrasound,  Ionotophoresis 4mg /ml Dexamethasone, Manual therapy, and Re-evaluation   PLAN FOR NEXT SESSION: TKA rehab protocol   Riki Altes, PTA  02/21/23 11:56 AM

## 2023-02-22 ENCOUNTER — Other Ambulatory Visit: Payer: Self-pay | Admitting: Physician Assistant

## 2023-02-25 ENCOUNTER — Encounter (HOSPITAL_BASED_OUTPATIENT_CLINIC_OR_DEPARTMENT_OTHER): Payer: Self-pay

## 2023-02-25 ENCOUNTER — Ambulatory Visit (HOSPITAL_BASED_OUTPATIENT_CLINIC_OR_DEPARTMENT_OTHER): Payer: HMO

## 2023-02-25 DIAGNOSIS — M25562 Pain in left knee: Secondary | ICD-10-CM | POA: Diagnosis not present

## 2023-02-25 DIAGNOSIS — M6281 Muscle weakness (generalized): Secondary | ICD-10-CM

## 2023-02-25 DIAGNOSIS — R262 Difficulty in walking, not elsewhere classified: Secondary | ICD-10-CM

## 2023-02-25 NOTE — Therapy (Signed)
OUTPATIENT PHYSICAL THERAPY LOWER EXTREMITY TREATMENT   Patient Name: Ashley Blair MRN: [id number] DOB:01-07-1956, 67 y.o., female Today's Date: 02/25/2023  END OF SESSION:  PT End of Session - 02/25/23 1551     Visit Number 3    Number of Visits 18    Date for PT Re-Evaluation 05/20/23    Authorization Type HTA    PT Start Time 1516    PT Stop Time 1600    PT Time Calculation (min) 44 min    Activity Tolerance Patient tolerated treatment well    Behavior During Therapy WFL for tasks assessed/performed               Past Medical History:  Diagnosis Date   Anxiety    Arthritis    Elevated hemoglobin A1c 2019   5.7   Family history of DES exposure    Fibrocystic breast    GERD (gastroesophageal reflux disease)    HPV (human papilloma virus) infection    Hypercholesterolemia    OCD (obsessive compulsive disorder)    Pre-diabetes    S/P excision of lipoma    Past Surgical History:  Procedure Laterality Date   ANKLE SURGERY     broken as a child   BREAST BIOPSY Right    COLONOSCOPY     DILATION AND CURETTAGE OF UTERUS     LIPOMA EXCISION     rib cage   TOTAL KNEE ARTHROPLASTY Left 02/02/2023   Procedure: LEFT TOTAL KNEE ARTHROPLASTY;  Surgeon: Tarry Kos, MD;  Location: [facility] OR;  Service: Orthopedics;  Laterality: Left;   TRIGGER FINGER RELEASE Left 03/2013   thumb   Patient Active Problem List   Diagnosis Date Noted   Status post total left knee replacement 02/02/2023   Primary osteoarthritis of left knee 02/01/2023   Family history of breast cancer in mother 12/12/2013   H/O diethylstilbestrol (DES) exposure in utero 12/12/2013    PCP:  Thana Ates, MD       REFERRING PROVIDER:  Tarry Kos, MD     REFERRING DIAG:  [phone] (ICD-10-CM) - Primary osteoarthritis of left knee      THERAPY DIAG:  Difficulty walking  Muscle weakness (generalized)  Acute pain of left knee  Rationale for Evaluation and Treatment:  Rehabilitation  ONSET DATE: 02/02/23 DOS  Operative procedure: Left total knee arthroplasty.   SUBJECTIVE:   SUBJECTIVE STATEMENT: Pt reports she has been frustrated and feels unsure if she is being active enough/resting enough. "I had a break down last night."  PERTINENT HISTORY: N/A PAIN:  Are you having pain? no: NPRS scale: 0/10 Pain location: posterior knee anterior knee  Pain description: swelling/full Aggravating factors: bending Relieving factors: N/A, ice/rest   PRECAUTIONS: Knee  RED FLAGS: None   WEIGHT BEARING RESTRICTIONS: No  FALLS:  Has patient fallen in last 6 months? No  LIVING ENVIRONMENT: Lives with: lives with their family and lives with their spouse Lives in: House/apartment Stairs: Yes Has following equipment at home: Single point cane  OCCUPATION: Marketing executive; Office manager  PLOF: Independent  PATIENT GOALS: return to normal exercise    OBJECTIVE:   DIAGNOSTIC FINDINGS: N/A  PATIENT SURVEYS:  FOTO 39 60 @ DC 12 MCII  COGNITION: Overall cognitive status: Within functional limits for tasks assessed     SENSATION: WFL  EDEMA:  Moderate, palpable edema anteriorly and posteriorly into popliteal fossa  POSTURE: No Significant postural limitations  PALPATION: TTP of medial and lateral knee; popliteal space; calf  supple  LOWER EXTREMITY ROM:  Active ROM Right eval Left eval L 7/20 L 7/24  Hip flexion      Hip extension      Hip abduction      Hip adduction      Hip internal rotation      Hip external rotation      Knee flexion 125 75 78 86  Knee extension 3 -12 -5   Ankle dorsiflexion      Ankle plantarflexion      Ankle inversion      Ankle eversion       (Blank rows = not tested)  LOWER EXTREMITY MMT:  MMT Right eval Left eval  Hip flexion    Hip extension    Hip abduction    Hip adduction    Hip internal rotation    Hip external rotation    Knee flexion 4+/5 4-/5  Knee extension 5/5 4-/5   Ankle dorsiflexion    Ankle plantarflexion    Ankle inversion    Ankle eversion     (Blank rows = not tested)   Gait: flexed knee gait on L without AD on level ground; able to perform step to pattern on stairs with single UE and no AD; supervision    TODAY'S TREATMENT:    PROM L knee STM and IASTM to lateral posterior knee and quads Patella mobilizations Tib/fem mobilizations posterior glide grade II-III  Supine SLR 2x10  Gait training hall- cues for L toe off Walking marches hall x2 Sci fit bike partial revs x5 min     PATIENT EDUCATION:  Education details:  surgical precautions, AD usage, gait mechanics, prognosis, anatomy, exercise progression, DOMS expectations, muscle firing,  envelope of function, HEP, POC  Person educated: Patient Education method: Explanation, Demonstration, Tactile cues, Verbal cues, and Handouts Education comprehension: verbalized understanding, returned demonstration, verbal cues required, tactile cues required, and needs further education     HOME EXERCISE PROGRAM:  Access Code: [email or web address] URL: [email or web address] Date: 02/19/2023 Prepared by: Zebedee Iba  ASSESSMENT:   CLINICAL IMPRESSION: Pt had improvement in knee flexion today to 86deg passively. Spent time with pt discussing healing process and activity expectations for her recovery. She is showing steady progress. Decreased toe off with gait, which she corrects following cues. Will monitor posterolateral knee discomfort as this is inhibiting pt's knee flexion, causing pain. Pt questioned whether DN will be beneficial. Will discuss with PT.     OBJECTIVE IMPAIRMENTS decreased ROM, decreased strength, hypomobility, increased fascial restrictions, increased muscle spasms, impaired flexibility, improper body mechanics, postural dysfunction, and pain.    ACTIVITY LIMITATIONS carrying, lifting, sitting, stairs, locomotion level   PARTICIPATION LIMITATIONS: driving, shopping,  community activity, occupation, and yard work   PERSONAL FACTORS Age, Time since onset of injury/illness/exacerbation, and 1 comorbidity:    are also affecting patient's functional outcome.        GOALS:   SHORT TERM GOALS: Target date: 04/02/2023     Pt will become independent with HEP in order to demonstrate synthesis of PT education.  Goal status: INITIAL   2.  Pt will be able to demonstrate at least 110 deg knee flexion  in order to demonstrate functional improvement in LE function for self-care and house hold duties.    Goal status: INITIAL   3.  Pt will score at least 12 pt increase on FOTO to demonstrate functional improvement in MCII and pt perceived function.     Goal status: INITIAL  LONG TERM GOALS: Target date: 05/20/2023     Pt  will become independent with final HEP in order to demonstrate synthesis of PT education.    Goal status: INITIAL   2.  Pt will be able to demonstrate/report ability to walk >30 mins without pain in order to demonstrate functional improvement and tolerance to exercise and community mobility.    Goal status: INITIAL   3.  Pt will be able to demonstrate full depth squat in order to demonstrate functional improvement in LE function for self-care and house hold duties.  INITIAL  4.  Pt will score >/= 60 on FOTO to demonstrate improvement in perceived L knee function.  Baseline:  Goal status: INITIAL  5.  Pt will be able to demonstrate full L knee ROM in order to demonstrate functional improvement in LE function for self-care and house hold duties.  INITIAL      PLAN: PT FREQUENCY: 1-2x/week   PT DURATION: 12 weeks    PLANNED INTERVENTIONS: Therapeutic exercises, Therapeutic activity, Neuromuscular re-education, Balance training, Gait training, Patient/Family education, Self Care, Joint mobilization, Joint manipulation, Orthotic/Fit training, Aquatic Therapy, Dry Needling, Electrical stimulation, Spinal manipulation, Spinal  mobilization, Cryotherapy, Moist heat, Compression bandaging, scar mobilization, Splintting, Taping, Vasopneumatic device, Traction, Ultrasound, Ionotophoresis 4mg /ml Dexamethasone, Manual therapy, and Re-evaluation   PLAN FOR NEXT SESSION: TKA rehab protocol   Riki Altes, PTA  02/25/23 5:10 PM

## 2023-02-27 ENCOUNTER — Ambulatory Visit (HOSPITAL_BASED_OUTPATIENT_CLINIC_OR_DEPARTMENT_OTHER): Payer: HMO | Admitting: Physical Therapy

## 2023-02-27 ENCOUNTER — Encounter (HOSPITAL_BASED_OUTPATIENT_CLINIC_OR_DEPARTMENT_OTHER): Payer: Self-pay | Admitting: Physical Therapy

## 2023-02-27 DIAGNOSIS — M6281 Muscle weakness (generalized): Secondary | ICD-10-CM

## 2023-02-27 DIAGNOSIS — M25562 Pain in left knee: Secondary | ICD-10-CM | POA: Diagnosis not present

## 2023-02-27 DIAGNOSIS — R262 Difficulty in walking, not elsewhere classified: Secondary | ICD-10-CM

## 2023-02-27 NOTE — Therapy (Signed)
OUTPATIENT PHYSICAL THERAPY LOWER EXTREMITY TREATMENT   Patient Name: Ashley Blair MRN: [id number] DOB:12-02-1955, 67 y.o., female Today's Date: 02/27/2023  END OF SESSION:  PT End of Session - 02/27/23 1107     Visit Number 4    Number of Visits 18    Date for PT Re-Evaluation 05/20/23    Authorization Type HTA    PT Start Time 1102    PT Stop Time 1149    PT Time Calculation (min) 47 min    Activity Tolerance Patient tolerated treatment well    Behavior During Therapy WFL for tasks assessed/performed                Past Medical History:  Diagnosis Date   Anxiety    Arthritis    Elevated hemoglobin A1c 2019   5.7   Family history of DES exposure    Fibrocystic breast    GERD (gastroesophageal reflux disease)    HPV (human papilloma virus) infection    Hypercholesterolemia    OCD (obsessive compulsive disorder)    Pre-diabetes    S/P excision of lipoma    Past Surgical History:  Procedure Laterality Date   ANKLE SURGERY     broken as a child   BREAST BIOPSY Right    COLONOSCOPY     DILATION AND CURETTAGE OF UTERUS     LIPOMA EXCISION     rib cage   TOTAL KNEE ARTHROPLASTY Left 02/02/2023   Procedure: LEFT TOTAL KNEE ARTHROPLASTY;  Surgeon: Tarry Kos, MD;  Location: [facility] OR;  Service: Orthopedics;  Laterality: Left;   TRIGGER FINGER RELEASE Left 03/2013   thumb   Patient Active Problem List   Diagnosis Date Noted   Status post total left knee replacement 02/02/2023   Primary osteoarthritis of left knee 02/01/2023   Family history of breast cancer in mother 12/12/2013   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   SUBJECTIVE:   SUBJECTIVE STATEMENT: Back of my knee hurts  PERTINENT HISTORY: N/A PAIN:  Are you having pain? no: NPRS scale: 0/10 Pain location: posterior knee anterior knee  Pain description: swelling/full Aggravating factors: bending Relieving factors: N/A, ice/rest   PRECAUTIONS: Knee  RED FLAGS: None   WEIGHT BEARING RESTRICTIONS: No  FALLS:  Has patient fallen in last 6 months? No  LIVING ENVIRONMENT: Lives with: lives with their family and lives with their spouse Lives in: House/apartment Stairs: Yes Has following equipment at home: Single point cane  OCCUPATION: Marketing executive; Office manager  PLOF: Independent  PATIENT GOALS: return to normal exercise    OBJECTIVE:   DIAGNOSTIC FINDINGS: N/A  PATIENT SURVEYS:  FOTO 39 60 @ DC 12 MCII  COGNITION: Overall cognitive status: Within functional limits for tasks assessed     SENSATION: WFL  EDEMA:  Moderate, palpable edema anteriorly and posteriorly into popliteal fossa  POSTURE: No Significant postural limitations  PALPATION: TTP of medial and lateral knee; popliteal space; calf supple  LOWER EXTREMITY ROM:  Active ROM Right eval Left eval L 7/20 L 7/24  Hip flexion      Hip extension      Hip abduction      Hip adduction      Hip internal rotation      Hip external rotation      Knee flexion 125 75 78 86  Knee extension 3 -12 -5   Ankle dorsiflexion      Ankle plantarflexion      Ankle inversion      Ankle eversion       (Blank rows = not tested)  LOWER EXTREMITY MMT:  MMT Right eval Left eval  Hip flexion    Hip extension    Hip abduction    Hip adduction    Hip internal rotation    Hip external rotation    Knee flexion 4+/5 4-/5  Knee extension 5/5 4-/5  Ankle dorsiflexion    Ankle plantarflexion    Ankle inversion    Ankle eversion     (Blank rows = not  tested)   Gait: flexed knee gait on L without AD on level ground; able to perform step to pattern on stairs with single UE and no AD; supervision    TODAY'S TREATMENT:   Treatment                            7/26:  Nu step 5 min L1 Edema mobilization in supine with leg elevated Passive knee flexion and patellar mobilizations Prone IASTM to lateral hamstring gastroc tendons over posterior knee, soft tissue massage into lateral gastroc belly Backward walking with slow eccentric gastroc motion Forward walking focusing on hip extension and dorsiflexion range of motion Heel raises with stretch on edge of step Knee flexion via foot wall slide    PATIENT EDUCATION:  Education details:  surgical precautions, AD usage, gait mechanics, prognosis, anatomy, exercise progression, DOMS expectations, muscle firing,  envelope of function, HEP, POC  Person educated: Patient Education method: Explanation, Demonstration, Tactile cues, Verbal cues, and Handouts Education comprehension: verbalized understanding, returned demonstration, verbal cues required, tactile cues required, and needs further education     HOME EXERCISE PROGRAM:  Access Code: [email or web address] URL: [email or web address]   ASSESSMENT:   CLINICAL IMPRESSION: Patient's range of motion looks excellent at this point but is very tender on the posterior side.  Signs and symptoms consistent with tendinitis at lateral gastroc and hamstring tendon overlap.  Better tolerance to wall slide for knee flexion as she was able to reduce guarding through gastroc and hamstring.  Advised to use a frozen water bottle for massage to tendons at home to reduce inflammation.    OBJECTIVE IMPAIRMENTS decreased ROM, decreased strength, hypomobility, increased fascial restrictions, increased muscle spasms, impaired flexibility, improper body mechanics, postural dysfunction, and pain.    ACTIVITY LIMITATIONS carrying, lifting, sitting, stairs,  locomotion level   PARTICIPATION LIMITATIONS: driving, shopping, community activity, occupation, and yard work   PERSONAL FACTORS Age, Time since onset of injury/illness/exacerbation, and 1 comorbidity:    are also affecting patient's functional outcome.        GOALS:   SHORT TERM GOALS: Target date: 04/02/2023     Pt will become independent with HEP in order to demonstrate synthesis of PT education.  Goal status: INITIAL   2.  Pt will be able to demonstrate at least 110 deg knee flexion  in order to demonstrate functional improvement in LE function for self-care and house hold duties.    Goal status: INITIAL  3.  Pt will score at least 12 pt increase on FOTO to demonstrate functional improvement in MCII and pt perceived function.     Goal status: INITIAL       LONG TERM GOALS: Target date: 05/20/2023     Pt  will become independent with final HEP in order to demonstrate synthesis of PT education.    Goal status: INITIAL   2.  Pt will be able to demonstrate/report ability to walk >30 mins without pain in order to demonstrate functional improvement and tolerance to exercise and community mobility.    Goal status: INITIAL   3.  Pt will be able to demonstrate full depth squat in order to demonstrate functional improvement in LE function for self-care and house hold duties.  INITIAL  4.  Pt will score >/= 60 on FOTO to demonstrate improvement in perceived L knee function.  Baseline:  Goal status: INITIAL  5.  Pt will be able to demonstrate full L knee ROM in order to demonstrate functional improvement in LE function for self-care and house hold duties.  INITIAL      PLAN: PT FREQUENCY: 1-2x/week   PT DURATION: 12 weeks    PLANNED INTERVENTIONS: Therapeutic exercises, Therapeutic activity, Neuromuscular re-education, Balance training, Gait training, Patient/Family education, Self Care, Joint mobilization, Joint manipulation, Orthotic/Fit training, Aquatic Therapy,  Dry Needling, Electrical stimulation, Spinal manipulation, Spinal mobilization, Cryotherapy, Moist heat, Compression bandaging, scar mobilization, Splintting, Taping, Vasopneumatic device, Traction, Ultrasound, Ionotophoresis 4mg /ml Dexamethasone, Manual therapy, and Re-evaluation   PLAN FOR NEXT SESSION: TKA rehab protocol  Rivers Hamrick C. Kenslie Abbruzzese PT, DPT 02/27/23 12:28 PM

## 2023-03-02 ENCOUNTER — Ambulatory Visit (HOSPITAL_BASED_OUTPATIENT_CLINIC_OR_DEPARTMENT_OTHER): Payer: HMO | Admitting: Physical Therapy

## 2023-03-02 ENCOUNTER — Encounter (HOSPITAL_BASED_OUTPATIENT_CLINIC_OR_DEPARTMENT_OTHER): Payer: Self-pay | Admitting: Physical Therapy

## 2023-03-02 DIAGNOSIS — M6281 Muscle weakness (generalized): Secondary | ICD-10-CM

## 2023-03-02 DIAGNOSIS — M25562 Pain in left knee: Secondary | ICD-10-CM

## 2023-03-02 DIAGNOSIS — R262 Difficulty in walking, not elsewhere classified: Secondary | ICD-10-CM

## 2023-03-02 NOTE — Therapy (Signed)
OUTPATIENT PHYSICAL THERAPY LOWER EXTREMITY TREATMENT   Patient Name: Ashley Blair MRN: [id number] DOB:October 19, 1955, 67 y.o., female Today's Date: 03/02/2023  END OF SESSION:  PT End of Session - 03/02/23 1443     Visit Number 5    Number of Visits 18    Date for PT Re-Evaluation 05/20/23    Authorization Type HTA    PT Start Time 1400    PT Stop Time 1440    PT Time Calculation (min) 40 min    Activity Tolerance Patient tolerated treatment well    Behavior During Therapy WFL for tasks assessed/performed                 Past Medical History:  Diagnosis Date   Anxiety    Arthritis    Elevated hemoglobin A1c 2019   5.7   Family history of DES exposure    Fibrocystic breast    GERD (gastroesophageal reflux disease)    HPV (human papilloma virus) infection    Hypercholesterolemia    OCD (obsessive compulsive disorder)    Pre-diabetes    S/P excision of lipoma    Past Surgical History:  Procedure Laterality Date   ANKLE SURGERY     broken as a child   BREAST BIOPSY Right    COLONOSCOPY     DILATION AND CURETTAGE OF UTERUS     LIPOMA EXCISION     rib cage   TOTAL KNEE ARTHROPLASTY Left 02/02/2023   Procedure: LEFT TOTAL KNEE ARTHROPLASTY;  Surgeon: Tarry Kos, MD;  Location: [facility] OR;  Service: Orthopedics;  Laterality: Left;   TRIGGER FINGER RELEASE Left 03/2013   thumb   Patient Active Problem List   Diagnosis Date Noted   Status post total left knee replacement 02/02/2023   Primary osteoarthritis of left knee 02/01/2023   Family history of breast cancer in mother 12/12/2013   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   SUBJECTIVE:   SUBJECTIVE STATEMENT: Pt states that the manual from last session was helpful. Pt does have increase in spasm of the quad.  Patient is up and about moving more throughout the day.  Patient walks quarter of a mile 2 times a day and has been doing the exercises 2-3 times a day.  Patient notes significant swelling and pain and discomfort.  PERTINENT HISTORY: N/A PAIN:  Are you having pain? no: NPRS scale: 0/10 Pain location: posterior knee anterior knee  Pain description: swelling/full Aggravating factors: bending Relieving factors: N/A, ice/rest   PRECAUTIONS: Knee  RED FLAGS: None   WEIGHT BEARING RESTRICTIONS: No  FALLS:  Has patient fallen in last 6 months? No  LIVING ENVIRONMENT: Lives with: lives with their family and lives with their spouse Lives in: House/apartment Stairs: Yes Has following equipment at home: Single point cane  OCCUPATION: fitness Secondary school teacher; Office manager  PLOF: Independent  PATIENT GOALS: return to normal exercise    OBJECTIVE:   DIAGNOSTIC FINDINGS: N/A  PATIENT SURVEYS:  FOTO 39 60 @ DC 12 MCII  COGNITION: Overall cognitive status: Within functional limits for tasks assessed     SENSATION: WFL  EDEMA:  Moderate, palpable edema anteriorly and posteriorly into popliteal fossa  POSTURE: No Significant postural limitations  PALPATION: TTP of medial and lateral knee; popliteal space; calf supple  LOWER EXTREMITY ROM:  Active ROM Right eval Left eval L 7/20 L 7/24  Hip flexion      Hip extension      Hip abduction      Hip adduction      Hip internal rotation      Hip external rotation      Knee flexion 125 75 78 86  Knee extension 3 -12 -5   Ankle dorsiflexion      Ankle plantarflexion      Ankle inversion      Ankle eversion       (Blank rows = not tested)  LOWER EXTREMITY  MMT:  MMT Right eval Left eval  Hip flexion    Hip extension    Hip abduction    Hip adduction    Hip internal rotation    Hip external rotation    Knee flexion 4+/5 4-/5  Knee extension 5/5 4-/5  Ankle dorsiflexion    Ankle plantarflexion    Ankle inversion    Ankle eversion     (Blank rows = not tested)   Gait: flexed knee gait on L without AD on level ground; able to perform step to pattern on stairs with single UE and no AD; supervision    TODAY'S TREATMENT:   Treatment                            7/29  Edema mobilization in supine with leg elevated Passive knee flexion and patellar mobilizations  Education regarding modification of all home exercises, foot contacts, swelling management, volume management, and self pain management  Treatment                            7/26:  Nu step 5 min L1 Edema mobilization in supine with leg elevated Passive knee flexion and patellar mobilizations Prone IASTM to lateral hamstring gastroc tendons over posterior knee, soft tissue massage into lateral gastroc belly Backward walking with slow eccentric gastroc motion Forward walking focusing on hip extension and dorsiflexion range of motion Heel raises with stretch on edge of step Knee flexion via foot wall slide    PATIENT EDUCATION:  Education details:  surgical precautions, AD usage, gait mechanics, prognosis, anatomy, exercise progression, DOMS expectations, muscle firing,  envelope of function, HEP, POC  Person educated: Patient Education method: Explanation, Demonstration, Tactile cues, Verbal cues, and Handouts Education comprehension: verbalized understanding, returned demonstration, verbal cues required, tactile cues required, and needs further education     HOME EXERCISE PROGRAM:  Access Code: [email or web address] URL: [email or web address]   ASSESSMENT:   CLINICAL IMPRESSION: Patient presents today with increased swelling edema of the left knee which is  greatly limiting her ability to flex.  Patient's pain is very irritable especially with flexion-based movement likely due to to the amount of fluid in the peri patellar pouches and popliteal space.  Patient given thorough education regarding weight management reduction of foot contacts and reduction of overall loading of the left knee in order to manage inflammation.  Patient advised to decrease volume of movement by at least 30 to 40% for the following 1 to 2 days.  Plan to focus on range of motion until next visit.  Patient will benefit from continued skilled therapy in order to address functional left lower extremity deficits for return to prior level of function.  OBJECTIVE IMPAIRMENTS decreased ROM, decreased strength, hypomobility, increased fascial restrictions, increased muscle spasms, impaired flexibility, improper body mechanics, postural dysfunction, and pain.    ACTIVITY LIMITATIONS carrying, lifting, sitting, stairs, locomotion level   PARTICIPATION LIMITATIONS: driving, shopping, community activity, occupation, and yard work   PERSONAL FACTORS Age, Time since onset of injury/illness/exacerbation, and 1 comorbidity:    are also affecting patient's functional outcome.        GOALS:   SHORT TERM GOALS: Target date: 04/02/2023     Pt will become independent with HEP in order to demonstrate synthesis of PT education.  Goal status: INITIAL   2.  Pt will be able to demonstrate at least 110 deg knee flexion  in order to demonstrate functional improvement in LE function for self-care and house hold duties.    Goal status: INITIAL   3.  Pt will score at least 12 pt increase on FOTO to demonstrate functional improvement in MCII and pt perceived function.     Goal status: INITIAL       LONG TERM GOALS: Target date: 05/20/2023     Pt  will become independent with final HEP in order to demonstrate synthesis of PT education.    Goal status: INITIAL   2.  Pt will be able to  demonstrate/report ability to walk >30 mins without pain in order to demonstrate functional improvement and tolerance to exercise and community mobility.    Goal status: INITIAL   3.  Pt will be able to demonstrate full depth squat in order to demonstrate functional improvement in LE function for self-care and house hold duties.  INITIAL  4.  Pt will score >/= 60 on FOTO to demonstrate improvement in perceived L knee function.  Baseline:  Goal status: INITIAL  5.  Pt will be able to demonstrate full L knee ROM in order to demonstrate functional improvement in LE function for self-care and house hold duties.  INITIAL      PLAN: PT FREQUENCY: 1-2x/week   PT DURATION: 12 weeks    PLANNED INTERVENTIONS: Therapeutic exercises, Therapeutic activity, Neuromuscular re-education, Balance training, Gait training, Patient/Family education, Self Care, Joint mobilization, Joint manipulation, Orthotic/Fit training, Aquatic Therapy, Dry Needling, Electrical stimulation, Spinal manipulation, Spinal mobilization, Cryotherapy, Moist heat, Compression bandaging, scar mobilization, Splintting, Taping, Vasopneumatic device, Traction, Ultrasound, Ionotophoresis 4mg /ml Dexamethasone, Manual therapy, and Re-evaluation   PLAN FOR NEXT SESSION: TKA rehab protocol  Zebedee Iba PT, DPT 03/02/23 2:44 PM

## 2023-03-04 ENCOUNTER — Encounter (HOSPITAL_BASED_OUTPATIENT_CLINIC_OR_DEPARTMENT_OTHER): Payer: Self-pay | Admitting: Physical Therapy

## 2023-03-04 ENCOUNTER — Other Ambulatory Visit: Payer: Self-pay | Admitting: Orthopaedic Surgery

## 2023-03-04 ENCOUNTER — Ambulatory Visit (HOSPITAL_BASED_OUTPATIENT_CLINIC_OR_DEPARTMENT_OTHER): Payer: HMO | Admitting: Physical Therapy

## 2023-03-04 DIAGNOSIS — M25562 Pain in left knee: Secondary | ICD-10-CM | POA: Diagnosis not present

## 2023-03-04 DIAGNOSIS — M6281 Muscle weakness (generalized): Secondary | ICD-10-CM

## 2023-03-04 DIAGNOSIS — R262 Difficulty in walking, not elsewhere classified: Secondary | ICD-10-CM

## 2023-03-04 MED ORDER — NAPROXEN 500 MG PO TABS: 500.0000 mg | ORAL_TABLET | Freq: Two times a day (BID) | ORAL | 3 refills | Status: AC

## 2023-03-04 NOTE — Therapy (Signed)
OUTPATIENT PHYSICAL THERAPY LOWER EXTREMITY TREATMENT   Patient Name: Tanisa Toni MRN: [id number] DOB:1955-10-08, 67 y.o., female Today's Date: 03/04/2023  END OF SESSION:  PT End of Session - 03/04/23 0846     Visit Number 6    Number of Visits 18    Date for PT Re-Evaluation 05/20/23    Authorization Type HTA    PT Start Time 0846    PT Stop Time 0925    PT Time Calculation (min) 39 min    Activity Tolerance Patient tolerated treatment well    Behavior During Therapy [facility] for tasks assessed/performed                 Past Medical History:  Diagnosis Date   Anxiety    Arthritis    Elevated hemoglobin A1c 2019   5.7   Family history of DES exposure    Fibrocystic breast    GERD (gastroesophageal reflux disease)    HPV (human papilloma virus) infection    Hypercholesterolemia    OCD (obsessive compulsive disorder)    Pre-diabetes    S/P excision of lipoma    Past Surgical History:  Procedure Laterality Date   ANKLE SURGERY     broken as a child   BREAST BIOPSY Right    COLONOSCOPY     DILATION AND CURETTAGE OF UTERUS     LIPOMA EXCISION     rib cage   TOTAL KNEE ARTHROPLASTY Left 02/02/2023   Procedure: LEFT TOTAL KNEE ARTHROPLASTY;  Surgeon: Tarry Kos, MD;  Location: [facility] OR;  Service: Orthopedics;  Laterality: Left;   TRIGGER FINGER RELEASE Left 03/2013   thumb   Patient Active Problem List   Diagnosis Date Noted   Status post total left knee replacement 02/02/2023   Primary osteoarthritis of left knee 02/01/2023   Family history of breast cancer in mother 12/12/2013   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   SUBJECTIVE:   SUBJECTIVE STATEMENT: Pt states it still feels very stiff. The swelling is about the same. Pt feels that the knee continues to be extremely stiff. Pt states the spasms have improved.   PERTINENT HISTORY: N/A PAIN:  Are you having pain? no: NPRS scale: 0/10 Pain location: posterior knee anterior knee  Pain description: swelling/full Aggravating factors: bending Relieving factors: N/A, ice/rest   PRECAUTIONS: Knee  RED FLAGS: None   WEIGHT BEARING RESTRICTIONS: No  FALLS:  Has patient fallen in last 6 months? No  LIVING ENVIRONMENT: Lives with: lives with their family and lives with their spouse Lives in: House/apartment Stairs: Yes Has following equipment at home: Single point cane  OCCUPATION: Marketing executive; Office manager  PLOF: Independent  PATIENT GOALS: return to normal exercise    OBJECTIVE:   DIAGNOSTIC FINDINGS: N/A  PATIENT SURVEYS:  FOTO 39 60 @ DC 12 MCII  COGNITION: Overall cognitive status: Within functional limits for tasks assessed     SENSATION: WFL  EDEMA:  Moderate, palpable edema anteriorly and posteriorly into popliteal fossa  POSTURE:  No Significant postural limitations  PALPATION: TTP of medial and lateral knee; popliteal space; calf supple  LOWER EXTREMITY ROM:  Active ROM Right eval Left eval L 7/20 L 7/24  Hip flexion      Hip extension      Hip abduction      Hip adduction      Hip internal rotation      Hip external rotation      Knee flexion 125 75 78 86  Knee extension 3 -12 -5   Ankle dorsiflexion      Ankle plantarflexion      Ankle inversion      Ankle eversion       (Blank rows = not tested)  LOWER EXTREMITY MMT:  MMT Right eval Left eval  Hip flexion    Hip extension    Hip abduction    Hip adduction    Hip internal rotation    Hip external rotation     Knee flexion 4+/5 4-/5  Knee extension 5/5 4-/5  Ankle dorsiflexion    Ankle plantarflexion    Ankle inversion    Ankle eversion     (Blank rows = not tested)   Gait: flexed knee gait on L without AD on level ground; able to perform step to pattern on stairs with single UE and no AD; supervision    TODAY'S TREATMENT:  Treatment                            73/1  Edema mobilization in supine with leg elevated Passive knee flexion STM L quad supine and HS in prone hang for ext  Recumbent bike 5 min half revolutions Standing HS curl 2x10 Standing TKE no band 20x YTB at knee sidestepping 3x laps at rail    Treatment                            7/29  Edema mobilization in supine with leg elevated Passive knee flexion and patellar mobilizations  Education regarding modification of all home exercises, foot contacts, swelling management, volume management, and self pain management  Treatment                            7/26:  Nu step 5 min L1 Edema mobilization in supine with leg elevated Passive knee flexion and patellar mobilizations Prone IASTM to lateral hamstring gastroc tendons over posterior knee, soft tissue massage into lateral gastroc belly Backward walking with slow eccentric gastroc motion Forward walking focusing on hip extension and dorsiflexion range of motion Heel raises with stretch on edge of step Knee flexion via foot wall slide    PATIENT EDUCATION:  Education details:  surgical precautions, AD usage, gait mechanics, prognosis, anatomy, exercise progression, DOMS expectations, muscle firing,  envelope of function, HEP, POC  Person educated: Patient Education method: Explanation, Demonstration, Tactile cues, Verbal cues, and Handouts Education comprehension: verbalized understanding, returned demonstration, verbal cues required, tactile cues required, and needs further education     HOME EXERCISE PROGRAM:  Access Code: [email or web address] URL:  [email or web address]   ASSESSMENT:   CLINICAL IMPRESSION: Patient presents today with continued left anterior knee irritation and inflammation which is limiting range of motion.  Patient is able to achieve full knee extension and prone hang.  However, flexion continues to be difficult due to pain.  Patient home exercises modified today  to decreased amount of standing loading and quad contraction in an effort to reduce anterior knee sensitivity.  Patient advised on using walking stick or hiking pole for outdoor walking in addition to using her upright bike for half revolutions.  Patient advised to continue working on soft tissue mobilization, bending, and straightening of the knee until next session.  If inflammation has decreased continue with strength as tolerated and loaded flexion.  Reduction of irritability ability and sensitivity of the knee is primary focus at this time.  Patient will benefit from continued skilled therapy in order to address functional left lower extremity deficits for return to prior level of function.  OBJECTIVE IMPAIRMENTS decreased ROM, decreased strength, hypomobility, increased fascial restrictions, increased muscle spasms, impaired flexibility, improper body mechanics, postural dysfunction, and pain.    ACTIVITY LIMITATIONS carrying, lifting, sitting, stairs, locomotion level   PARTICIPATION LIMITATIONS: driving, shopping, community activity, occupation, and yard work   PERSONAL FACTORS Age, Time since onset of injury/illness/exacerbation, and 1 comorbidity:    are also affecting patient's functional outcome.        GOALS:   SHORT TERM GOALS: Target date: 04/02/2023     Pt will become independent with HEP in order to demonstrate synthesis of PT education.  Goal status: INITIAL   2.  Pt will be able to demonstrate at least 110 deg knee flexion  in order to demonstrate functional improvement in LE function for self-care and house hold duties.     Goal status: INITIAL   3.  Pt will score at least 12 pt increase on FOTO to demonstrate functional improvement in MCII and pt perceived function.     Goal status: INITIAL       LONG TERM GOALS: Target date: 05/20/2023     Pt  will become independent with final HEP in order to demonstrate synthesis of PT education.    Goal status: INITIAL   2.  Pt will be able to demonstrate/report ability to walk >30 mins without pain in order to demonstrate functional improvement and tolerance to exercise and community mobility.    Goal status: INITIAL   3.  Pt will be able to demonstrate full depth squat in order to demonstrate functional improvement in LE function for self-care and house hold duties.  INITIAL  4.  Pt will score >/= 60 on FOTO to demonstrate improvement in perceived L knee function.  Baseline:  Goal status: INITIAL  5.  Pt will be able to demonstrate full L knee ROM in order to demonstrate functional improvement in LE function for self-care and house hold duties.  INITIAL      PLAN: PT FREQUENCY: 1-2x/week   PT DURATION: 12 weeks    PLANNED INTERVENTIONS: Therapeutic exercises, Therapeutic activity, Neuromuscular re-education, Balance training, Gait training, Patient/Family education, Self Care, Joint mobilization, Joint manipulation, Orthotic/Fit training, Aquatic Therapy, Dry Needling, Electrical stimulation, Spinal manipulation, Spinal mobilization, Cryotherapy, Moist heat, Compression bandaging, scar mobilization, Splintting, Taping, Vasopneumatic device, Traction, Ultrasound, Ionotophoresis 4mg /ml Dexamethasone, Manual therapy, and Re-evaluation   PLAN FOR NEXT SESSION: TKA rehab protocol  Zebedee Iba PT, DPT 03/04/23 9:33 AM

## 2023-03-09 ENCOUNTER — Encounter (HOSPITAL_BASED_OUTPATIENT_CLINIC_OR_DEPARTMENT_OTHER): Payer: Self-pay | Admitting: Physical Therapy

## 2023-03-09 ENCOUNTER — Ambulatory Visit (HOSPITAL_BASED_OUTPATIENT_CLINIC_OR_DEPARTMENT_OTHER): Payer: HMO | Attending: Orthopaedic Surgery | Admitting: Physical Therapy

## 2023-03-09 DIAGNOSIS — M25562 Pain in left knee: Secondary | ICD-10-CM

## 2023-03-09 DIAGNOSIS — R262 Difficulty in walking, not elsewhere classified: Secondary | ICD-10-CM | POA: Diagnosis not present

## 2023-03-09 DIAGNOSIS — M6281 Muscle weakness (generalized): Secondary | ICD-10-CM

## 2023-03-09 DIAGNOSIS — Z96652 Presence of left artificial knee joint: Secondary | ICD-10-CM | POA: Insufficient documentation

## 2023-03-09 NOTE — Telephone Encounter (Signed)
Error charting

## 2023-03-09 NOTE — Therapy (Signed)
OUTPATIENT PHYSICAL THERAPY LOWER EXTREMITY TREATMENT   Patient Name: Ashley Blair MRN: [id number] DOB:05/23/1956, 67 y.o., female Today's Date: 03/09/2023  END OF SESSION:  PT End of Session - 03/09/23 0840     Visit Number 7    Number of Visits 18    Date for PT Re-Evaluation 05/20/23    Authorization Type HTA    PT Start Time 0845    PT Stop Time 0925    PT Time Calculation (min) 40 min    Activity Tolerance Patient tolerated treatment well    Behavior During Therapy [facility] for tasks assessed/performed                 Past Medical History:  Diagnosis Date   Anxiety    Arthritis    Elevated hemoglobin A1c 2019   5.7   Family history of DES exposure    Fibrocystic breast    GERD (gastroesophageal reflux disease)    HPV (human papilloma virus) infection    Hypercholesterolemia    OCD (obsessive compulsive disorder)    Pre-diabetes    S/P excision of lipoma    Past Surgical History:  Procedure Laterality Date   ANKLE SURGERY     broken as a child   BREAST BIOPSY Right    COLONOSCOPY     DILATION AND CURETTAGE OF UTERUS     LIPOMA EXCISION     rib cage   TOTAL KNEE ARTHROPLASTY Left 02/02/2023   Procedure: LEFT TOTAL KNEE ARTHROPLASTY;  Surgeon: Tarry Kos, MD;  Location: [facility] OR;  Service: Orthopedics;  Laterality: Left;   TRIGGER FINGER RELEASE Left 03/2013   thumb   Patient Active Problem List   Diagnosis Date Noted   Status post total left knee replacement 02/02/2023   Primary osteoarthritis of left knee 02/01/2023   Family history of breast cancer in mother 12/12/2013   H/O diethylstilbestrol (DES) exposure in utero 12/12/2013    PCP:  Thana Ates, MD       REFERRING PROVIDER:  Tarry Kos, MD     REFERRING DIAG:  [phone] (ICD-10-CM) - Primary osteoarthritis of left knee      THERAPY DIAG:  Muscle weakness (generalized)  Difficulty walking  Acute pain of left knee  Rationale for Evaluation and Treatment:  Rehabilitation  ONSET DATE: 02/02/23 DOS  Days since surgery: 35   Operative procedure: Left total knee arthroplasty.   SUBJECTIVE:   SUBJECTIVE STATEMENT: It is so stiff. I don't want to do anything wrong. Napro BID since last Wed.   PERTINENT HISTORY: N/A PAIN:  Are you having pain? NPRS scale: significant/10 Pain location: posterior knee anterior knee  Pain description: swelling/full Aggravating factors: bending Relieving factors: N/A, ice/rest   PRECAUTIONS: Knee  RED FLAGS: None   WEIGHT BEARING RESTRICTIONS: No  FALLS:  Has patient fallen in last 6 months? No  LIVING ENVIRONMENT: Lives with: lives with their family and lives with their spouse Lives in: House/apartment Stairs: Yes Has following equipment at home: Single point cane  OCCUPATION: Marketing executive; Office manager  PLOF: Independent  PATIENT GOALS: return to normal exercise    OBJECTIVE:   DIAGNOSTIC FINDINGS: N/A  PATIENT SURVEYS:  FOTO 39 60 @ DC 12 MCII  COGNITION: Overall cognitive status: Within functional limits for tasks assessed     SENSATION: WFL  EDEMA:  Moderate, palpable edema anteriorly and posteriorly into popliteal fossa  POSTURE: No Significant postural limitations  PALPATION: TTP of medial and lateral knee; popliteal space;  calf supple  LOWER EXTREMITY ROM:  Active ROM Right eval Left eval L 7/20 L 7/24 Lt 8/5  Hip flexion       Hip extension       Hip abduction       Hip adduction       Hip internal rotation       Hip external rotation       Knee flexion 125 75 78 86 95  Knee extension 3 -12 -5    Ankle dorsiflexion       Ankle plantarflexion       Ankle inversion       Ankle eversion        (Blank rows = not tested)  LOWER EXTREMITY MMT:  MMT Right eval Left eval  Hip flexion    Hip extension    Hip abduction    Hip adduction    Hip internal rotation    Hip external rotation    Knee flexion 4+/5 4-/5  Knee extension 5/5 4-/5   Ankle dorsiflexion    Ankle plantarflexion    Ankle inversion    Ankle eversion     (Blank rows = not tested)   Gait: flexed knee gait on L without AD on level ground; able to perform step to pattern on stairs with single UE and no AD; supervision    TODAY'S TREATMENT:  Treatment                            8/5:  Nu step 10 min  with education Seated 90 deg distraction + passive flexion Seated LAQ short range, both legs Step over hurdle Gait training with focus on glut activation rather than knee    Treatment                            73/1  Edema mobilization in supine with leg elevated Passive knee flexion STM L quad supine and HS in prone hang for ext  Recumbent bike 5 min half revolutions Standing HS curl 2x10 Standing TKE no band 20x YTB at knee sidestepping 3x laps at rail    Treatment                            7/29  Edema mobilization in supine with leg elevated Passive knee flexion and patellar mobilizations  Education regarding modification of all home exercises, foot contacts, swelling management, volume management, and self pain management    PATIENT EDUCATION:  Education details:  surgical precautions, AD usage, gait mechanics, prognosis, anatomy, exercise progression, DOMS expectations, muscle firing,  envelope of function, HEP, POC  Person educated: Patient Education method: Explanation, Demonstration, Tactile cues, Verbal cues, and Handouts Education comprehension: verbalized understanding, returned demonstration, verbal cues required, tactile cues required, and needs further education     HOME EXERCISE PROGRAM:  Access Code: [email or web address] URL: [email or web address]   ASSESSMENT:   CLINICAL IMPRESSION: Significant improvement in gait pattern and decreased anxiety with training today. Discussed using pilates reformer plate in supine for foot work as well as short box quadruped in neutral to increase glut strength & challenge knee  flexion.   OBJECTIVE IMPAIRMENTS decreased ROM, decreased strength, hypomobility, increased fascial restrictions, increased muscle spasms, impaired flexibility, improper body mechanics, postural dysfunction, and pain.    ACTIVITY LIMITATIONS carrying, lifting, sitting, stairs, locomotion level   PARTICIPATION LIMITATIONS: driving,  shopping, community activity, occupation, and yard work   PERSONAL FACTORS Age, Time since onset of injury/illness/exacerbation, and 1 comorbidity:    are also affecting patient's functional outcome.        GOALS:   SHORT TERM GOALS: Target date: 04/02/2023     Pt will become independent with HEP in order to demonstrate synthesis of PT education.  Goal status: INITIAL   2.  Pt will be able to demonstrate at least 110 deg knee flexion  in order to demonstrate functional improvement in LE function for self-care and house hold duties.    Goal status: INITIAL   3.  Pt will score at least 12 pt increase on FOTO to demonstrate functional improvement in MCII and pt perceived function.     Goal status: INITIAL       LONG TERM GOALS: Target date: 05/20/2023     Pt  will become independent with final HEP in order to demonstrate synthesis of PT education.    Goal status: INITIAL   2.  Pt will be able to demonstrate/report ability to walk >30 mins without pain in order to demonstrate functional improvement and tolerance to exercise and community mobility.    Goal status: INITIAL   3.  Pt will be able to demonstrate full depth squat in order to demonstrate functional improvement in LE function for self-care and house hold duties.  INITIAL  4.  Pt will score >/= 60 on FOTO to demonstrate improvement in perceived L knee function.  Baseline:  Goal status: INITIAL  5.  Pt will be able to demonstrate full L knee ROM in order to demonstrate functional improvement in LE function for self-care and house hold duties.  INITIAL      PLAN: PT FREQUENCY:  1-2x/week   PT DURATION: 12 weeks    PLANNED INTERVENTIONS: Therapeutic exercises, Therapeutic activity, Neuromuscular re-education, Balance training, Gait training, Patient/Family education, Self Care, Joint mobilization, Joint manipulation, Orthotic/Fit training, Aquatic Therapy, Dry Needling, Electrical stimulation, Spinal manipulation, Spinal mobilization, Cryotherapy, Moist heat, Compression bandaging, scar mobilization, Splintting, Taping, Vasopneumatic device, Traction, Ultrasound, Ionotophoresis 4mg /ml Dexamethasone, Manual therapy, and Re-evaluation   PLAN FOR NEXT SESSION: glut activation  Ashley Blair PT, DPT 03/09/23 9:29 AM

## 2023-03-11 ENCOUNTER — Ambulatory Visit (HOSPITAL_BASED_OUTPATIENT_CLINIC_OR_DEPARTMENT_OTHER): Payer: HMO | Admitting: Physical Therapy

## 2023-03-11 ENCOUNTER — Encounter (HOSPITAL_BASED_OUTPATIENT_CLINIC_OR_DEPARTMENT_OTHER): Payer: Self-pay | Admitting: Physical Therapy

## 2023-03-11 DIAGNOSIS — M25562 Pain in left knee: Secondary | ICD-10-CM

## 2023-03-11 DIAGNOSIS — Z1331 Encounter for screening for depression: Secondary | ICD-10-CM | POA: Diagnosis not present

## 2023-03-11 DIAGNOSIS — E7800 Pure hypercholesterolemia, unspecified: Secondary | ICD-10-CM | POA: Diagnosis not present

## 2023-03-11 DIAGNOSIS — M542 Cervicalgia: Secondary | ICD-10-CM | POA: Diagnosis not present

## 2023-03-11 DIAGNOSIS — M6281 Muscle weakness (generalized): Secondary | ICD-10-CM | POA: Diagnosis not present

## 2023-03-11 DIAGNOSIS — K219 Gastro-esophageal reflux disease without esophagitis: Secondary | ICD-10-CM | POA: Diagnosis not present

## 2023-03-11 DIAGNOSIS — R262 Difficulty in walking, not elsewhere classified: Secondary | ICD-10-CM

## 2023-03-11 DIAGNOSIS — R7303 Prediabetes: Secondary | ICD-10-CM | POA: Diagnosis not present

## 2023-03-11 DIAGNOSIS — Z0000 Encounter for general adult medical examination without abnormal findings: Secondary | ICD-10-CM | POA: Diagnosis not present

## 2023-03-11 DIAGNOSIS — F419 Anxiety disorder, unspecified: Secondary | ICD-10-CM | POA: Diagnosis not present

## 2023-03-11 NOTE — Therapy (Addendum)
OUTPATIENT PHYSICAL THERAPY LOWER EXTREMITY TREATMENT   Patient Name: Ashley Blair MRN: [id number] DOB:[birth year], 67 y.o., female Today's Date: 03/11/2023  END OF SESSION:  PT End of Session - 03/11/23 0930     Visit Number 8    Number of Visits 18    Date for PT Re-Evaluation 05/20/23    Authorization Type HTA    PT Start Time 0845    PT Stop Time 0926    PT Time Calculation (min) 41 min    Activity Tolerance Patient tolerated treatment well    Behavior During Therapy [facility] for tasks assessed/performed                  Past Medical History:  Diagnosis Date   Anxiety    Arthritis    Elevated hemoglobin A1c 2019   5.7   Family history of DES exposure    Fibrocystic breast    GERD (gastroesophageal reflux disease)    HPV (human papilloma virus) infection    Hypercholesterolemia    OCD (obsessive compulsive disorder)    Pre-diabetes    S/P excision of lipoma    Past Surgical History:  Procedure Laterality Date   ANKLE SURGERY     broken as a child   BREAST BIOPSY Right    COLONOSCOPY     DILATION AND CURETTAGE OF UTERUS     LIPOMA EXCISION     rib cage   TOTAL KNEE ARTHROPLASTY Left 02/02/2023   Procedure: LEFT TOTAL KNEE ARTHROPLASTY;  Surgeon: Tarry Kos, MD;  Location: [facility] OR;  Service: Orthopedics;  Laterality: Left;   TRIGGER FINGER RELEASE Left 03/2013   thumb   Patient Active Problem List   Diagnosis Date Noted   Status post total left knee replacement 02/02/2023   Primary osteoarthritis of left knee 02/01/2023   Family history of breast cancer in mother 12/12/2013   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   SUBJECTIVE:   SUBJECTIVE STATEMENT: Pt states the knee just feels stiff today. No pain. Pt states she is working hard on flexion with distraction as well as straightening.   PERTINENT HISTORY: N/A PAIN:  Are you having pain? No NPRS scale: 0/10 Pain location: posterior knee anterior knee  Pain description: swelling/full Aggravating factors: bending Relieving factors: N/A, ice/rest   PRECAUTIONS: Knee  RED FLAGS: None   WEIGHT BEARING RESTRICTIONS: No  FALLS:  Has patient fallen in last 6 months? No  LIVING ENVIRONMENT: Lives with: lives with their family and lives with their spouse Lives in: House/apartment Stairs: Yes Has following equipment at home: Single point cane  OCCUPATION: Marketing executive; Office manager  PLOF: Independent  PATIENT GOALS: return to normal exercise    OBJECTIVE:   DIAGNOSTIC FINDINGS: N/A  PATIENT SURVEYS:  FOTO 39 60 @ DC 12 MCII  COGNITION: Overall cognitive status: Within functional limits for tasks assessed     SENSATION: WFL  EDEMA:  Moderate, palpable edema anteriorly and posteriorly into popliteal fossa  POSTURE: No Significant postural limitations  PALPATION: TTP of medial and lateral knee; popliteal space; calf supple  LOWER EXTREMITY ROM:  Active ROM Right eval Left eval L 7/20 L 7/24 Lt 8/5  Hip flexion       Hip extension       Hip abduction       Hip adduction       Hip internal rotation       Hip external rotation       Knee flexion 125 75 78 86 95  Knee extension 3 -12 -5    Ankle dorsiflexion       Ankle plantarflexion       Ankle inversion       Ankle eversion        (Blank rows = not tested)  LOWER EXTREMITY MMT:  MMT Right eval Left eval  Hip flexion    Hip extension    Hip abduction    Hip adduction    Hip internal rotation    Hip external rotation     Knee flexion 4+/5 4-/5  Knee extension 5/5 4-/5  Ankle dorsiflexion    Ankle plantarflexion    Ankle inversion    Ankle eversion     (Blank rows = not tested)   Gait: flexed knee gait on L without AD on level ground; able to perform step to pattern on stairs with single UE and no AD; supervision    TODAY'S TREATMENT:   Treatment                            8/7  Nu step 8 min  with education Seated manual distraction and flexion mobilization to L knee; grade III-IV MET/PNF flexion stretch with ischemic pressure to rec fem Active release with pressure to rec fem  Peri scar skin mobilization biasing flexion and extension  Stair step flexion stretch 10s 10x YTB TKE 2x10 2s hold  Treatment                            8/5:  Nu step 10 min  with education Seated 90 deg distraction + passive flexion Seated LAQ short range, both legs Step over hurdle Gait training with focus on glut activation rather than knee    Treatment                            73/1  Edema mobilization in supine with leg elevated Passive knee flexion STM L quad supine and HS in prone hang for ext  Recumbent bike 5 min half revolutions Standing HS curl 2x10 Standing TKE no band 20x YTB at knee sidestepping 3x laps at rail    Treatment                            7/29  Edema mobilization in supine with leg elevated Passive knee flexion and patellar mobilizations  Education regarding modification of all home exercises, foot contacts, swelling management, volume management, and self pain management    PATIENT EDUCATION:  Education details:  surgical precautions, AD usage, gait mechanics, prognosis, anatomy, exercise progression, DOMS expectations, muscle firing,  envelope of function, HEP, POC  Person educated: Patient Education method: Explanation, Demonstration, Tactile cues, Verbal cues, and Handouts Education comprehension: verbalized understanding, returned demonstration, verbal cues required,  tactile cues required, and needs further education  HOME EXERCISE PROGRAM:  Access Code: [email or web address] URL: [email or web address]   ASSESSMENT:   CLINICAL IMPRESSION: Patient able to reach 95 degrees of knee flexion again following joint mobilization.  Patient without pain at this time and has visible decrease in swelling.  Patient did respond well to scar tissue mobilization, distracted joint mobilization, as well as soft tissue mobilization at the quadricep.  Patient home exercise modified to perform stair step flexion stretch for loaded flexion as heel slide currently causes severe pain.  Patient also able to start terminal knee extension in standing weightbearing position.  Plan to continue with knee joint range of motion as well as quad strength at future sessions.    OBJECTIVE IMPAIRMENTS decreased ROM, decreased strength, hypomobility, increased fascial restrictions, increased muscle spasms, impaired flexibility, improper body mechanics, postural dysfunction, and pain.    ACTIVITY LIMITATIONS carrying, lifting, sitting, stairs, locomotion level   PARTICIPATION LIMITATIONS: driving, shopping, community activity, occupation, and yard work   PERSONAL FACTORS Age, Time since onset of injury/illness/exacerbation, and 1 comorbidity:    are also affecting patient's functional outcome.        GOALS:   SHORT TERM GOALS: Target date: 04/02/2023     Pt will become independent with HEP in order to demonstrate synthesis of PT education.  Goal status: INITIAL   2.  Pt will be able to demonstrate at least 110 deg knee flexion  in order to demonstrate functional improvement in LE function for self-care and house hold duties.    Goal status: INITIAL   3.  Pt will score at least 12 pt increase on FOTO to demonstrate functional improvement in MCII and pt perceived function.     Goal status: INITIAL       LONG TERM GOALS: Target date: 05/20/2023     Pt  will become  independent with final HEP in order to demonstrate synthesis of PT education.    Goal status: INITIAL   2.  Pt will be able to demonstrate/report ability to walk >30 mins without pain in order to demonstrate functional improvement and tolerance to exercise and community mobility.    Goal status: INITIAL   3.  Pt will be able to demonstrate full depth squat in order to demonstrate functional improvement in LE function for self-care and house hold duties.  INITIAL  4.  Pt will score >/= 60 on FOTO to demonstrate improvement in perceived L knee function.  Baseline:  Goal status: INITIAL  5.  Pt will be able to demonstrate full L knee ROM in order to demonstrate functional improvement in LE function for self-care and house hold duties.  INITIAL      PLAN: PT FREQUENCY: 1-2x/week   PT DURATION: 12 weeks    PLANNED INTERVENTIONS: Therapeutic exercises, Therapeutic activity, Neuromuscular re-education, Balance training, Gait training, Patient/Family education, Self Care, Joint mobilization, Joint manipulation, Orthotic/Fit training, Aquatic Therapy, Dry Needling, Electrical stimulation, Spinal manipulation, Spinal mobilization, Cryotherapy, Moist heat, Compression bandaging, scar mobilization, Splintting, Taping, Vasopneumatic device, Traction, Ultrasound, Ionotophoresis 4mg /ml Dexamethasone, Manual therapy, and Re-evaluation   PLAN FOR NEXT SESSION: glut activation  Zebedee Iba PT, DPT 03/17/23 12:53 PM

## 2023-03-17 ENCOUNTER — Encounter (HOSPITAL_BASED_OUTPATIENT_CLINIC_OR_DEPARTMENT_OTHER): Payer: Self-pay | Admitting: Physical Therapy

## 2023-03-17 ENCOUNTER — Ambulatory Visit (HOSPITAL_BASED_OUTPATIENT_CLINIC_OR_DEPARTMENT_OTHER): Payer: HMO | Admitting: Physical Therapy

## 2023-03-17 ENCOUNTER — Encounter: Payer: Self-pay | Admitting: Physician Assistant

## 2023-03-17 ENCOUNTER — Ambulatory Visit: Payer: HMO | Admitting: Physician Assistant

## 2023-03-17 ENCOUNTER — Other Ambulatory Visit (INDEPENDENT_AMBULATORY_CARE_PROVIDER_SITE_OTHER): Payer: HMO

## 2023-03-17 DIAGNOSIS — Z96652 Presence of left artificial knee joint: Secondary | ICD-10-CM

## 2023-03-17 DIAGNOSIS — R262 Difficulty in walking, not elsewhere classified: Secondary | ICD-10-CM

## 2023-03-17 DIAGNOSIS — M6281 Muscle weakness (generalized): Secondary | ICD-10-CM | POA: Diagnosis not present

## 2023-03-17 NOTE — Progress Notes (Signed)
Post-Op Visit Note   Patient: Ashley Blair           Date of Birth: [birth year]           MRN: [id number] Visit Date: 03/17/2023 PCP: Thana Ates, MD   Assessment & Plan:  Chief Complaint:  Chief Complaint  Patient presents with   Left Knee - Follow-up    Left total knee arthroplasty 02/02/2023   Visit Diagnoses:  1. Status post total left knee replacement     Plan: Patient is a pleasant 67 year old female who comes in today 6 weeks status post left total knee replacement 02/02/2023.  She has been doing okay.  She still notes constant stiffness to the left knee which is somewhat limiting her progression in therapy.  Currently ambulating without assistance.  She is taking occasional naproxen for pain.  She has finished the Xarelto for which she was taking for DVT prophylaxis.  Examination of the left knee reveals a well-healed surgical scar without complication.  Most of the Steri-Strips are still in place.  She has swelling to the left knee.  Range of motion 0 to 90 degrees.  She is neurovascularly intact distally.  At this point, I would like for her to continue pushing things in physical therapy as well is with a home exercise program.  She will continue taking naproxen as needed as long as this is helping.  She does not wish to take anything stronger.  She will follow-up with Dr. Roda Shutters in 4 weeks for repeat evaluation and to possibly discuss manipulation under anesthesia.  She will call us with any concerns or questions in the meantime.  Follow-Up Instructions: Return in about 4 weeks (around 04/14/2023).   Orders:  Orders Placed This Encounter  Procedures   XR Knee 1-2 Views Left   No orders of the defined types were placed in this encounter.   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 12/12/2013   Past Medical History:  Diagnosis Date   Anxiety    Arthritis    Elevated hemoglobin A1c 2019   5.7   Family history of DES exposure    Fibrocystic breast    GERD (gastroesophageal reflux disease)    HPV (human papilloma virus) infection    Hypercholesterolemia    OCD (obsessive compulsive disorder)    Pre-diabetes    S/P excision of lipoma     Family History  Problem Relation Age of Onset   Breast cancer Mother    Osteoporosis Maternal Grandmother    Stroke Maternal Grandfather     Past Surgical History:  Procedure Laterality Date   ANKLE SURGERY     broken as a child   BREAST BIOPSY Right    COLONOSCOPY     DILATION AND CURETTAGE OF UTERUS     LIPOMA EXCISION     rib cage   TOTAL KNEE ARTHROPLASTY Left 02/02/2023   Procedure: LEFT TOTAL KNEE ARTHROPLASTY;  Surgeon: Tarry Kos, MD;  Location: [facility] OR;  Service: Orthopedics;  Laterality: Left;   TRIGGER FINGER RELEASE Left 03/2013   thumb   Social History   Occupational History   Not on file  Tobacco  Use   Smoking status: Former    Current packs/day: 0.00    Types: Cigarettes    Quit date: 12/07/1975    Years since quitting: 47.3   Smokeless tobacco: Never  Vaping Use   Vaping status: Never Used  Substance and Sexual Activity   Alcohol use: Not Currently    Comment: rarely   Drug use: No   Sexual activity: Not Currently    Partners: Male    Birth control/protection: Post-menopausal

## 2023-03-17 NOTE — Therapy (Addendum)
OUTPATIENT PHYSICAL THERAPY LOWER EXTREMITY TREATMENT   Patient Name: Ashley Blair MRN: [id number] DOB:August 02, 1956, 67 y.o., female Today's Date: 03/17/2023  END OF SESSION:  PT End of Session - 03/17/23 1240     Visit Number 9    Number of Visits 18    Date for PT Re-Evaluation 05/20/23    Authorization Type HTA    PT Start Time 1145    PT Stop Time 1228    PT Time Calculation (min) 43 min    Activity Tolerance Patient tolerated treatment well    Behavior During Therapy WFL for tasks assessed/performed                   Past Medical History:  Diagnosis Date   Anxiety    Arthritis    Elevated hemoglobin A1c 2019   5.7   Family history of DES exposure    Fibrocystic breast    GERD (gastroesophageal reflux disease)    HPV (human papilloma virus) infection    Hypercholesterolemia    OCD (obsessive compulsive disorder)    Pre-diabetes    S/P excision of lipoma    Past Surgical History:  Procedure Laterality Date   ANKLE SURGERY     broken as a child   BREAST BIOPSY Right    COLONOSCOPY     DILATION AND CURETTAGE OF UTERUS     LIPOMA EXCISION     rib cage   TOTAL KNEE ARTHROPLASTY Left 02/02/2023   Procedure: LEFT TOTAL KNEE ARTHROPLASTY;  Surgeon: Tarry Kos, MD;  Location: [facility] OR;  Service: Orthopedics;  Laterality: Left;   TRIGGER FINGER RELEASE Left 03/2013   thumb   Patient Active Problem List   Diagnosis Date Noted   Status post total left knee replacement 02/02/2023   Primary osteoarthritis of left knee 02/01/2023   Family history of breast cancer in mother 12/12/2013   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   SUBJECTIVE:   SUBJECTIVE STATEMENT: Pt states that the knee feels tight today. She had 7.8k steps on Sunday which causes more stiffness. The step stretch is much better than the heel slide. Pt did take Pilates classes with modifications   PERTINENT HISTORY: N/A PAIN:  Are you having pain? No NPRS scale: 0/10 Pain location: posterior knee anterior knee  Pain description: swelling/full Aggravating factors: bending Relieving factors: N/A, ice/rest   PRECAUTIONS: Knee  RED FLAGS: None   WEIGHT BEARING RESTRICTIONS: No  FALLS:  Has patient fallen in last 6 months? No  LIVING ENVIRONMENT: Lives with: lives with their family and lives with their spouse Lives in: House/apartment Stairs: Yes Has following equipment at home: Single point cane  OCCUPATION: Marketing executive; Office manager  PLOF: Independent  PATIENT GOALS: return to normal exercise    OBJECTIVE:   DIAGNOSTIC FINDINGS: N/A  PATIENT SURVEYS:  FOTO 39 60 @ DC 12 MCII  COGNITION: Overall cognitive status: Within functional limits for tasks assessed     SENSATION: WFL  EDEMA:  Moderate, palpable edema anteriorly and posteriorly into popliteal fossa  POSTURE: No Significant postural limitations  PALPATION: TTP of medial and lateral knee; popliteal space; calf supple  LOWER EXTREMITY ROM:  Active ROM Right eval Left eval L 7/20 L 7/24 Lt 8/5  Hip flexion       Hip extension       Hip abduction       Hip adduction       Hip internal rotation       Hip external rotation       Knee flexion 125 75 78 86 95  Knee extension 3 -12 -5    Ankle dorsiflexion       Ankle plantarflexion       Ankle inversion       Ankle eversion        (Blank rows = not tested)  LOWER EXTREMITY MMT:  MMT Right eval Left eval  Hip flexion    Hip extension    Hip abduction    Hip adduction    Hip internal rotation     Hip external rotation    Knee flexion 4+/5 4-/5  Knee extension 5/5 4-/5  Ankle dorsiflexion    Ankle plantarflexion    Ankle inversion    Ankle eversion     (Blank rows = not tested)   Gait: flexed knee gait on L without AD on level ground; able to perform step to pattern on stairs with single UE and no AD; supervision    TODAY'S TREATMENT:   Treatment                            8/13  Recumbent bike half revolutions 8 min  Seated manual distraction and flexion mobilization to L knee; grade III-IV Tibial ER knee ext mob (screw home) grade IV  Flexion to 90; ext 1 hyper  Stair step flexion stretch 10s 10x 3lb LAQ 20x 3lb standing HS curl 20x  STS 3x8  Treatment                            8/7  Nu step 8 min  with education Seated manual distraction and flexion mobilization to L knee; grade III-IV MET/PNF flexion stretch with ischemic pressure to rec fem Active release with pressure to rec fem  Peri scar skin mobilization biasing flexion and extension  Stair step flexion stretch 10s 10x YTB TKE 2x10 2s hold  Treatment                            8/5:  Nu step 10 min  with education Seated 90 deg distraction + passive flexion Seated LAQ short range, both legs Step over hurdle Gait training with focus on glut activation rather than knee    Treatment                            73/1  Edema mobilization in supine with leg elevated Passive knee flexion STM L quad supine and HS in prone hang for ext  Recumbent bike 5 min half revolutions Standing HS curl 2x10 Standing TKE no band 20x YTB at knee sidestepping 3x laps at rail    Treatment                            7/29  Edema mobilization in  supine with leg elevated Passive knee flexion and patellar mobilizations  Education regarding modification of all home exercises, foot contacts, swelling management, volume management, and self pain management    PATIENT EDUCATION:  Education details:  surgical  precautions, AD usage, gait mechanics, prognosis, anatomy, exercise progression, DOMS expectations, muscle firing,  envelope of function, HEP, POC  Person educated: Patient Education method: Explanation, Demonstration, Tactile cues, Verbal cues, and Handouts Education comprehension: verbalized understanding, returned demonstration, verbal cues required, tactile cues required, and needs further education     HOME EXERCISE PROGRAM:  Access Code: [email or web address] URL: [email or web address]   ASSESSMENT:   CLINICAL IMPRESSION:  Patient able to reach 90 degrees of knee flexion starting from 80 at beginning of session.  Pt able to reach 1 deg of ext today following joint mobilization and posterior capsule stretching. HEP progressed to progress quad activation, LLLD stretching, and re-intro to CKC strength with mindfulness of mitigating rebound effusion/inflammatory response from overuse. Pt able to tolerate all exercise today without pain. HEP updated and modified. Pt to see MD/PA today for f/u visit. Plan to continue with ROM and strength as tolerated. Pt would benefit from continued skilled therapy in order to reach goals and maximize functional R LE strength and ROM for full return to PLOF.     OBJECTIVE IMPAIRMENTS decreased ROM, decreased strength, hypomobility, increased fascial restrictions, increased muscle spasms, impaired flexibility, improper body mechanics, postural dysfunction, and pain.    ACTIVITY LIMITATIONS carrying, lifting, sitting, stairs, locomotion level   PARTICIPATION LIMITATIONS: driving, shopping, community activity, occupation, and yard work   PERSONAL FACTORS Age, Time since onset of injury/illness/exacerbation, and 1 comorbidity:    are also affecting patient's functional outcome.        GOALS:   SHORT TERM GOALS: Target date: 04/02/2023     Pt will become independent with HEP in order to demonstrate synthesis of PT education.  Goal status: INITIAL    2.  Pt will be able to demonstrate at least 110 deg knee flexion  in order to demonstrate functional improvement in LE function for self-care and house hold duties.    Goal status: INITIAL   3.  Pt will score at least 12 pt increase on FOTO to demonstrate functional improvement in MCII and pt perceived function.     Goal status: INITIAL       LONG TERM GOALS: Target date: 05/20/2023     Pt  will become independent with final HEP in order to demonstrate synthesis of PT education.    Goal status: INITIAL   2.  Pt will be able to demonstrate/report ability to walk >30 mins without pain in order to demonstrate functional improvement and tolerance to exercise and community mobility.    Goal status: INITIAL   3.  Pt will be able to demonstrate full depth squat in order to demonstrate functional improvement in LE function for self-care and house hold duties.  INITIAL  4.  Pt will score >/= 60 on FOTO to demonstrate improvement in perceived L knee function.  Baseline:  Goal status: INITIAL  5.  Pt will be able to demonstrate full L knee ROM in order to demonstrate functional improvement in LE function for self-care and house hold duties.  INITIAL      PLAN: PT FREQUENCY: 1-2x/week   PT DURATION: 12 weeks    PLANNED INTERVENTIONS: Therapeutic exercises, Therapeutic activity, Neuromuscular re-education, Balance training, Gait training, Patient/Family education, Self Care, Joint mobilization, Joint manipulation, Orthotic/Fit training, Aquatic Therapy, Dry Needling,  Electrical stimulation, Spinal manipulation, Spinal mobilization, Cryotherapy, Moist heat, Compression bandaging, scar mobilization, Splintting, Taping, Vasopneumatic device, Traction, Ultrasound, Ionotophoresis 4mg /ml Dexamethasone, Manual therapy, and Re-evaluation   PLAN FOR NEXT SESSION: L knee ROM, quad, HS, and hip strength  Zebedee Iba PT, DPT 03/17/23 12:52 PM

## 2023-03-19 ENCOUNTER — Encounter (HOSPITAL_BASED_OUTPATIENT_CLINIC_OR_DEPARTMENT_OTHER): Payer: Self-pay | Admitting: Physical Therapy

## 2023-03-19 ENCOUNTER — Ambulatory Visit (HOSPITAL_BASED_OUTPATIENT_CLINIC_OR_DEPARTMENT_OTHER): Payer: HMO | Admitting: Physical Therapy

## 2023-03-19 DIAGNOSIS — M6281 Muscle weakness (generalized): Secondary | ICD-10-CM | POA: Diagnosis not present

## 2023-03-19 DIAGNOSIS — M25562 Pain in left knee: Secondary | ICD-10-CM

## 2023-03-19 DIAGNOSIS — R262 Difficulty in walking, not elsewhere classified: Secondary | ICD-10-CM

## 2023-03-19 NOTE — Therapy (Addendum)
OUTPATIENT PHYSICAL THERAPY LOWER EXTREMITY TREATMENT   Patient Name: Ashley Blair MRN: [id number] DOB:[birth year], 67 y.o., female Today's Date: 03/19/2023  END OF SESSION:  PT End of Session - 03/19/23 1152     Visit Number 10    Number of Visits 18    Date for PT Re-Evaluation 05/20/23    Authorization Type HTA    PT Start Time 1145    PT Stop Time 1230    PT Time Calculation (min) 45 min    Activity Tolerance Patient tolerated treatment well    Behavior During Therapy WFL for tasks assessed/performed                   Past Medical History:  Diagnosis Date   Anxiety    Arthritis    Elevated hemoglobin A1c 2019   5.7   Family history of DES exposure    Fibrocystic breast    GERD (gastroesophageal reflux disease)    HPV (human papilloma virus) infection    Hypercholesterolemia    OCD (obsessive compulsive disorder)    Pre-diabetes    S/P excision of lipoma    Past Surgical History:  Procedure Laterality Date   ANKLE SURGERY     broken as a child   BREAST BIOPSY Right    COLONOSCOPY     DILATION AND CURETTAGE OF UTERUS     LIPOMA EXCISION     rib cage   TOTAL KNEE ARTHROPLASTY Left 02/02/2023   Procedure: LEFT TOTAL KNEE ARTHROPLASTY;  Surgeon: Tarry Kos, MD;  Location: [facility] OR;  Service: Orthopedics;  Laterality: Left;   TRIGGER FINGER RELEASE Left 03/2013   thumb   Patient Active Problem List   Diagnosis Date Noted   Status post total left knee replacement 02/02/2023   Primary osteoarthritis of left knee 02/01/2023   Family history of breast cancer in mother 12/12/2013   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   SUBJECTIVE:   SUBJECTIVE STATEMENT: Pt saw PA and MUA discussed as pt's flexion is concerning. Pt has been diligent with HEP but does feel pain in the back of the knee with bending motions.   PERTINENT HISTORY: N/A PAIN:  Are you having pain? No NPRS scale: 0/10 Pain location: posterior knee anterior knee  Pain description: swelling/full Aggravating factors: bending Relieving factors: N/A, ice/rest   PRECAUTIONS: Knee  RED FLAGS: None   WEIGHT BEARING RESTRICTIONS: No  FALLS:  Has patient fallen in last 6 months? No  LIVING ENVIRONMENT: Lives with: lives with their family and lives with their spouse Lives in: House/apartment Stairs: Yes Has following equipment at home: Single point cane  OCCUPATION: fitness Secondary school teacher; Office manager  PLOF: Independent  PATIENT GOALS: return to normal exercise    OBJECTIVE:   DIAGNOSTIC FINDINGS: N/A  PATIENT SURVEYS:  FOTO 39 60 @ DC 12 MCII   COGNITION: Overall cognitive status: Within functional limits for tasks assessed     SENSATION: WFL  EDEMA:  Moderate, palpable edema anteriorly and posteriorly into  popliteal fossa  POSTURE: No Significant postural limitations  PALPATION: TTP of medial and lateral knee; popliteal space; calf supple  LOWER EXTREMITY ROM:  Active ROM Right eval Left eval L 7/20 L 7/24 Lt 8/5 Lt  8/5  Hip flexion        Hip extension        Hip abduction        Hip adduction        Hip internal rotation        Hip external rotation        Knee flexion 125 75 78 86 95 91  Knee extension 3 -12 -5   1  Ankle dorsiflexion        Ankle plantarflexion        Ankle inversion        Ankle eversion         (Blank rows = not tested)  LOWER EXTREMITY MMT:  MMT Right eval Left eval L 815  Hip flexion     Hip extension     Hip abduction     Hip adduction      Hip internal rotation     Hip external rotation     Knee flexion 4+/5 4-/5 4/5  Knee extension 5/5 4-/5 4/5  Ankle dorsiflexion     Ankle plantarflexion     Ankle inversion     Ankle eversion      (Blank rows = not tested)   Gait: stiff knee gait on L; supervision    TODAY'S TREATMENT:   Treatment                            8/15  Recumbent bike half revolutions 8 min  Seated manual distraction and flexion mobilization to L knee; grade IV Scar tissue mobilization in flexion STM to L lateral HS and gastroc  Caregiver edu regarding joint mobilizations, soft tissue, and other modalities for usage at home  Stair step flexion stretch 10s 10x LLLD stretching for the knee into flexion HEP modifications  Treatment                            8/13  Recumbent bike half revolutions 8 min  Seated manual distraction and flexion mobilization to L knee; grade III-IV Tibial ER knee ext mob (screw home) grade IV  Flexion to 90; ext 1 hyper  Stair step flexion stretch 10s 10x 3lb LAQ 20x 3lb standing HS curl 20x  STS 3x8  Treatment                            8/7  Nu step 8 min  with education Seated manual distraction and flexion mobilization to L knee; grade III-IV MET/PNF flexion stretch with ischemic pressure to rec fem Active release with pressure to rec fem  Peri scar skin mobilization biasing flexion and extension  Stair step flexion stretch 10s 10x YTB TKE 2x10 2s hold  Treatment                            8/5:  Nu step 10 min  with education Seated 90 deg distraction + passive flexion Seated LAQ short range, both legs Step over hurdle Gait training with focus on glut activation rather than knee    Treatment  73/1  Edema mobilization in supine with leg elevated Passive knee flexion STM L quad supine and HS in prone hang for ext  Recumbent bike 5 min half revolutions Standing HS curl 2x10 Standing TKE no band 20x YTB at knee  sidestepping 3x laps at rail    Treatment                            7/29  Edema mobilization in supine with leg elevated Passive knee flexion and patellar mobilizations  Education regarding modification of all home exercises, foot contacts, swelling management, volume management, and self pain management    PATIENT EDUCATION:  Education details:  surgical precautions, AD usage, gait mechanics, prognosis, anatomy, exercise progression, DOMS expectations, muscle firing,  envelope of function, HEP, POC  Person educated: Patient Education method: Explanation, Demonstration, Tactile cues, Verbal cues, and Handouts Education comprehension: verbalized understanding, returned demonstration, verbal cues required, tactile cues required, and needs further education     HOME EXERCISE PROGRAM:  Access Code: [email or web address] URL: [email or web address]   ASSESSMENT:   CLINICAL IMPRESSION:  Patient returns to therapy today following surgical office visit With concerns regarding mention of manipulation under anesthesia.  Patient is knee flexion appears limited due to lateral hamstring and lateral gastroc pain with active range of motion knee flexion.  Patient did have improvement in tolerance to knee mobilizations following soft tissue of these areas.  Caregiver education provided regarding technique for soft tissue mobilization at home as well as joint mobilization technique.  Patient advised on acceptable levels of pain with home exercise and stretching in order to progress flexion.  Patient does have full knee extension at this time.  Moving forward plan to continue with flexion-based exercise in order to avoid manipulation under anesthesia.  Scar tissue mobilization technique also discussed with patient in order to reduce superficial scar adhesions.  Plan to continue with ROM and strength as tolerated. Pt does continue to show improvement in knee ROM and strength, though slow into flexion.  FOTO at next available session. Pt would benefit from continued skilled therapy in order to reach goals and maximize functional R LE strength and ROM for full return to PLOF.     OBJECTIVE IMPAIRMENTS decreased ROM, decreased strength, hypomobility, increased fascial restrictions, increased muscle spasms, impaired flexibility, improper body mechanics, postural dysfunction, and pain.    ACTIVITY LIMITATIONS carrying, lifting, sitting, stairs, locomotion level   PARTICIPATION LIMITATIONS: driving, shopping, community activity, occupation, and yard work   PERSONAL FACTORS Age, Time since onset of injury/illness/exacerbation, and 1 comorbidity:    are also affecting patient's functional outcome.        GOALS:   SHORT TERM GOALS: Target date: 04/02/2023     Pt will become independent with HEP in order to demonstrate synthesis of PT education.  Goal status: INITIAL   2.  Pt will be able to demonstrate at least 110 deg knee flexion  in order to demonstrate functional improvement in LE function for self-care and house hold duties.    Goal status: INITIAL   3.  Pt will score at least 12 pt increase on FOTO to demonstrate functional improvement in MCII and pt perceived function.     Goal status: INITIAL       LONG TERM GOALS: Target date: 05/20/2023     Pt  will become independent with final HEP in order to demonstrate synthesis of PT education.    Goal status: INITIAL   2.  Pt will be able to demonstrate/report ability to walk >30 mins without pain in order to demonstrate functional improvement and tolerance to exercise and community mobility.    Goal status: INITIAL   3.  Pt will be able to demonstrate full depth squat in order to demonstrate functional improvement in LE function for self-care and house hold duties.  INITIAL  4.  Pt will score >/= 60 on FOTO to demonstrate improvement in perceived L knee function.  Baseline:  Goal status: INITIAL  5.  Pt will be able to  demonstrate full L knee ROM in order to demonstrate functional improvement in LE function for self-care and house hold duties.  INITIAL      PLAN: PT FREQUENCY: 1-2x/week   PT DURATION: 12 weeks    PLANNED INTERVENTIONS: Therapeutic exercises, Therapeutic activity, Neuromuscular re-education, Balance training, Gait training, Patient/Family education, Self Care, Joint mobilization, Joint manipulation, Orthotic/Fit training, Aquatic Therapy, Dry Needling, Electrical stimulation, Spinal manipulation, Spinal mobilization, Cryotherapy, Moist heat, Compression bandaging, scar mobilization, Splintting, Taping, Vasopneumatic device, Traction, Ultrasound, Ionotophoresis 4mg /ml Dexamethasone, Manual therapy, and Re-evaluation   PLAN FOR NEXT SESSION: L knee ROM, quad, HS, and hip strength  Zebedee Iba PT, DPT 03/19/23 12:43 PM

## 2023-03-20 ENCOUNTER — Telehealth: Payer: Self-pay | Admitting: Orthopaedic Surgery

## 2023-03-20 NOTE — Telephone Encounter (Signed)
Patient called and said she needs to see if she can reschedule her for the knee procedure because she is going out of town if she need it. So her question is do she have to come in 10 Weeks [phone]

## 2023-03-23 ENCOUNTER — Encounter (HOSPITAL_BASED_OUTPATIENT_CLINIC_OR_DEPARTMENT_OTHER): Payer: Self-pay | Admitting: Physical Therapy

## 2023-03-23 ENCOUNTER — Ambulatory Visit (HOSPITAL_BASED_OUTPATIENT_CLINIC_OR_DEPARTMENT_OTHER): Payer: HMO | Admitting: Physical Therapy

## 2023-03-23 DIAGNOSIS — R262 Difficulty in walking, not elsewhere classified: Secondary | ICD-10-CM

## 2023-03-23 DIAGNOSIS — M6281 Muscle weakness (generalized): Secondary | ICD-10-CM | POA: Diagnosis not present

## 2023-03-23 DIAGNOSIS — M25562 Pain in left knee: Secondary | ICD-10-CM

## 2023-03-23 NOTE — Therapy (Signed)
OUTPATIENT PHYSICAL THERAPY LOWER EXTREMITY TREATMENT   Patient Name: Ashley Blair MRN: [id number] DOB:1955-12-26, 67 y.o., female Today's Date: 03/23/2023  END OF SESSION:  PT End of Session - 03/23/23 1203     Visit Number 11    Number of Visits 18    Date for PT Re-Evaluation 05/20/23    Authorization Type HTA    PT Start Time 1015    PT Stop Time 1100    PT Time Calculation (min) 45 min    Activity Tolerance Patient tolerated treatment well    Behavior During Therapy WFL for tasks assessed/performed                    Past Medical History:  Diagnosis Date   Anxiety    Arthritis    Elevated hemoglobin A1c 2019   5.7   Family history of DES exposure    Fibrocystic breast    GERD (gastroesophageal reflux disease)    HPV (human papilloma virus) infection    Hypercholesterolemia    OCD (obsessive compulsive disorder)    Pre-diabetes    S/P excision of lipoma    Past Surgical History:  Procedure Laterality Date   ANKLE SURGERY     broken as a child   BREAST BIOPSY Right    COLONOSCOPY     DILATION AND CURETTAGE OF UTERUS     LIPOMA EXCISION     rib cage   TOTAL KNEE ARTHROPLASTY Left 02/02/2023   Procedure: LEFT TOTAL KNEE ARTHROPLASTY;  Surgeon: Tarry Kos, MD;  Location: [facility] OR;  Service: Orthopedics;  Laterality: Left;   TRIGGER FINGER RELEASE Left 03/2013   thumb   Patient Active Problem List   Diagnosis Date Noted   Status post total left knee replacement 02/02/2023   Primary osteoarthritis of left knee 02/01/2023   Family history of breast cancer in mother 12/12/2013   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   SUBJECTIVE:   SUBJECTIVE STATEMENT: Pt states the L knee feels better. The knee is bending much better and she feels that she feels more normal ,with less fatigue. Husband notices she is walking more smoothly and has less of a limp with walking.   PERTINENT HISTORY: N/A PAIN:  Are you having pain? No NPRS scale: 0/10 Pain location: posterior knee anterior knee  Pain description: swelling/full Aggravating factors: bending Relieving factors: N/A, ice/rest   PRECAUTIONS: Knee  RED FLAGS: None   WEIGHT BEARING RESTRICTIONS: No  FALLS:  Has patient fallen in last 6 months? No  LIVING ENVIRONMENT: Lives with: lives with their family and lives with their spouse Lives in: House/apartment Stairs: Yes Has following equipment at home: Single point cane  OCCUPATION: Marketing executive; Office manager  PLOF: Independent  PATIENT GOALS: return to normal exercise    OBJECTIVE:   DIAGNOSTIC FINDINGS: N/A  PATIENT SURVEYS:  FOTO 39 60 @ DC 12 MCII   COGNITION: Overall cognitive status: Within functional limits for tasks assessed     SENSATION: [facility]  EDEMA:  Moderate, palpable edema anteriorly and posteriorly into popliteal fossa  POSTURE: No Significant postural limitations  PALPATION: TTP of medial and lateral knee; popliteal space; calf supple  LOWER EXTREMITY ROM:  Active ROM Right eval Left eval L 7/20 L 7/24 Lt 8/5 Lt  8/5 8/19  Hip flexion         Hip extension         Hip abduction         Hip adduction         Hip internal rotation         Hip external rotation         Knee flexion 125 75 78 86 95 91 101 CKC   Knee extension 3 -12 -5   1 0  Ankle dorsiflexion         Ankle plantarflexion         Ankle inversion         Ankle eversion          (Blank rows = not tested)   TODAY'S TREATMENT:   Treatment                             8/19  Recumbent bike half revolutions 8 min  Seated manual distraction and flexion mobilization to L knee; grade IV L knee patellar mobs inf/sup and ML garde IV STM to L lateral HS and gastroc  Seated HS stretch on table 30s 3x  Stair step flexion stretch 10s 10x  8" lateral step down 3x6   Edu regarding self patellar and scar mobilizations, soft tissue mobilization using percussion massager, and other modalities for pain management   Treatment                            8/15  Recumbent bike half revolutions 8 min  Seated manual distraction and flexion mobilization to L knee; grade IV Scar tissue mobilization in flexion STM to L lateral HS and gastroc  Caregiver edu regarding joint mobilizations, soft tissue, and other modalities for usage at home  Stair step flexion stretch 10s 10x LLLD stretching for the knee into flexion HEP modifications  Treatment                            8/13  Recumbent bike half revolutions 8 min  Seated manual distraction and flexion mobilization to L knee; grade III-IV Tibial ER knee ext mob (screw home) grade IV  Flexion to 90; ext 1 hyper  Stair step flexion stretch 10s 10x 3lb LAQ 20x 3lb standing HS curl 20x  STS 3x8  Treatment                            8/7  Nu step 8 min  with education Seated manual distraction and flexion mobilization to L knee; grade III-IV MET/PNF flexion stretch with ischemic pressure to rec fem Active release with pressure to rec fem  Peri scar skin mobilization biasing flexion and extension  Stair step flexion stretch 10s 10x YTB TKE 2x10 2s hold  Treatment                            8/5:  Nu step 10 min  with education Seated 90 deg distraction + passive  flexion Seated LAQ short range, both legs Step over hurdle Gait training with focus on glut activation rather than knee    Treatment                            73/1  Edema mobilization in supine with leg elevated Passive knee  flexion STM L quad supine and HS in prone hang for ext  Recumbent bike 5 min half revolutions Standing HS curl 2x10 Standing TKE no band 20x YTB at knee sidestepping 3x laps at rail    Treatment                            7/29  Edema mobilization in supine with leg elevated Passive knee flexion and patellar mobilizations  Education regarding modification of all home exercises, foot contacts, swelling management, volume management, and self pain management    PATIENT EDUCATION:  Education details:  anatomy, exercise progression, DOMS expectations, muscle firing,  envelope of function, HEP, POC  Person educated: Patient Education method: Explanation, Demonstration, Tactile cues, Verbal cues, and Handouts Education comprehension: verbalized understanding, returned demonstration, verbal cues required, tactile cues required, and needs further education     HOME EXERCISE PROGRAM:  Access Code: [email or web address] URL: [email or web address]   ASSESSMENT:   CLINICAL IMPRESSION:  Pt able to improve knee flexion to >100 deg today in clinic at end of session after joint mobs and exercise. Pt's lateral HS and gastroc pain does appear to limited flexion ROM but improves following manual and exercise. Pt HEP updated today to improve eccentric loading tolerance of the L knee. Pt with passive insufficiency of the rec fem during quad stretching so consider knee flexion based stretch in hip ext/neutral at next. Focus on flexion and maintenance of ext at future sessions with updates to strengthening PRN. Pt's pain is much better controlled at this time and is making steady progress. FOTO at next available. Pt would benefit from continued skilled therapy in order to reach goals and maximize functional R LE strength and ROM for full return to PLOF.     OBJECTIVE IMPAIRMENTS decreased ROM, decreased strength, hypomobility, increased fascial restrictions, increased muscle spasms, impaired  flexibility, improper body mechanics, postural dysfunction, and pain.    ACTIVITY LIMITATIONS carrying, lifting, sitting, stairs, locomotion level   PARTICIPATION LIMITATIONS: driving, shopping, community activity, occupation, and yard work   PERSONAL FACTORS Age, Time since onset of injury/illness/exacerbation, and 1 comorbidity:    are also affecting patient's functional outcome.        GOALS:   SHORT TERM GOALS: Target date: 04/02/2023     Pt will become independent with HEP in order to demonstrate synthesis of PT education.  Goal status: met   2.  Pt will be able to demonstrate at least 110 deg knee flexion  in order to demonstrate functional improvement in LE function for self-care and house hold duties.    Goal status: ongoing   3.  Pt will score at least 12 pt increase on FOTO to demonstrate functional improvement in MCII and pt perceived function.     Goal status: INITIAL       LONG TERM GOALS: Target date: 05/20/2023     Pt  will become independent with final HEP in order to demonstrate synthesis of PT education.    Goal status: ongoing   2.  Pt will be able to demonstrate/report ability to walk >30 mins  without pain in order to demonstrate functional improvement and tolerance to exercise and community mobility.    Goal status: met   3.  Pt will be able to demonstrate full depth squat in order to demonstrate functional improvement in LE function for self-care and house hold duties.  ongoing  4.  Pt will score >/= 60 on FOTO to demonstrate improvement in perceived L knee function.  Baseline:  Goal status: ongoing  5.  Pt will be able to demonstrate full L knee ROM in order to demonstrate functional improvement in LE function for self-care and house hold duties.  ongoing      PLAN: PT FREQUENCY: 1-2x/week   PT DURATION: 12 weeks    PLANNED INTERVENTIONS: Therapeutic exercises, Therapeutic activity, Neuromuscular re-education, Balance training, Gait  training, Patient/Family education, Self Care, Joint mobilization, Joint manipulation, Orthotic/Fit training, Aquatic Therapy, Dry Needling, Electrical stimulation, Spinal manipulation, Spinal mobilization, Cryotherapy, Moist heat, Compression bandaging, scar mobilization, Splintting, Taping, Vasopneumatic device, Traction, Ultrasound, Ionotophoresis 4mg /ml Dexamethasone, Manual therapy, and Re-evaluation   PLAN FOR NEXT SESSION: L knee ROM, quad, HS, and hip strength  Zebedee Iba PT, DPT 03/23/23 12:16 PM

## 2023-03-24 ENCOUNTER — Encounter (HOSPITAL_BASED_OUTPATIENT_CLINIC_OR_DEPARTMENT_OTHER): Payer: HMO

## 2023-03-25 ENCOUNTER — Ambulatory Visit (HOSPITAL_BASED_OUTPATIENT_CLINIC_OR_DEPARTMENT_OTHER): Payer: HMO | Admitting: Physical Therapy

## 2023-03-25 ENCOUNTER — Encounter (HOSPITAL_BASED_OUTPATIENT_CLINIC_OR_DEPARTMENT_OTHER): Payer: Self-pay | Admitting: Physical Therapy

## 2023-03-25 DIAGNOSIS — M25562 Pain in left knee: Secondary | ICD-10-CM

## 2023-03-25 DIAGNOSIS — M6281 Muscle weakness (generalized): Secondary | ICD-10-CM | POA: Diagnosis not present

## 2023-03-25 DIAGNOSIS — R262 Difficulty in walking, not elsewhere classified: Secondary | ICD-10-CM

## 2023-03-25 NOTE — Therapy (Signed)
OUTPATIENT PHYSICAL THERAPY LOWER EXTREMITY TREATMENT   Patient Name: Ashley Blair MRN: [id number] DOB:September 21, 1955, 67 y.o., female Today's Date: 03/25/2023  END OF SESSION:  PT End of Session - 03/25/23 1021     Visit Number 12    Number of Visits 18    Date for PT Re-Evaluation 05/20/23    Authorization Type HTA    PT Start Time 1017    PT Stop Time 1100    PT Time Calculation (min) 43 min    Activity Tolerance Patient tolerated treatment well    Behavior During Therapy WFL for tasks assessed/performed                    Past Medical History:  Diagnosis Date   Anxiety    Arthritis    Elevated hemoglobin A1c 2019   5.7   Family history of DES exposure    Fibrocystic breast    GERD (gastroesophageal reflux disease)    HPV (human papilloma virus) infection    Hypercholesterolemia    OCD (obsessive compulsive disorder)    Pre-diabetes    S/P excision of lipoma    Past Surgical History:  Procedure Laterality Date   ANKLE SURGERY     broken as a child   BREAST BIOPSY Right    COLONOSCOPY     DILATION AND CURETTAGE OF UTERUS     LIPOMA EXCISION     rib cage   TOTAL KNEE ARTHROPLASTY Left 02/02/2023   Procedure: LEFT TOTAL KNEE ARTHROPLASTY;  Surgeon: Tarry Kos, MD;  Location: [facility] OR;  Service: Orthopedics;  Laterality: Left;   TRIGGER FINGER RELEASE Left 03/2013   thumb   Patient Active Problem List   Diagnosis Date Noted   Status post total left knee replacement 02/02/2023   Primary osteoarthritis of left knee 02/01/2023   Family history of breast cancer in mother 12/12/2013   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   SUBJECTIVE:   SUBJECTIVE STATEMENT: It is still just so tight.   PERTINENT HISTORY: N/A PAIN:  Are you having pain? No NPRS scale: 0/10 Pain location: posterior knee anterior knee  Pain description: swelling/full Aggravating factors: bending Relieving factors: N/A, ice/rest   PRECAUTIONS: Knee  RED FLAGS: None   WEIGHT BEARING RESTRICTIONS: No  FALLS:  Has patient fallen in last 6 months? No  LIVING ENVIRONMENT: Lives with: lives with their family and lives with their spouse Lives in: House/apartment Stairs: Yes Has following equipment at home: Single point cane  OCCUPATION: Marketing executive; Office manager  PLOF: Independent  PATIENT GOALS: return to normal exercise    OBJECTIVE:   DIAGNOSTIC FINDINGS: N/A  PATIENT SURVEYS:  FOTO 39 60 @ DC 12 MCII   COGNITION: Overall cognitive status: Within functional limits for tasks assessed     SENSATION: WFL  EDEMA:  Moderate, palpable edema anteriorly and posteriorly into popliteal fossa  POSTURE: No Significant postural limitations  PALPATION: TTP of medial and lateral knee; popliteal space; calf supple  LOWER EXTREMITY  ROM:  Active ROM Right eval Left eval L 7/20 L 7/24 Lt 8/5 Lt  8/5 8/19  Hip flexion         Hip extension         Hip abduction         Hip adduction         Hip internal rotation         Hip external rotation         Knee flexion 125 75 78 86 95 91 101 CKC   Knee extension 3 -12 -5   1 0  Ankle dorsiflexion         Ankle plantarflexion         Ankle inversion         Ankle eversion          (Blank rows = not tested)   TODAY'S TREATMENT:   Treatment                            8/21:  Bike 5 min half revolutions Scar mob: IASTM & rolling proximal 1/3 incision Standing quad stretch with foot on table behind Heel raise, toe squat neutral &  turnout Squat to table tap, also added hold 105 deg flx in heel slide, 100 lunging in chair   Treatment                            8/19  Recumbent bike half revolutions 8 min  Seated manual distraction and flexion mobilization to L knee; grade IV L knee patellar mobs inf/sup and ML garde IV STM to L lateral HS and gastroc  Seated HS stretch on table 30s 3x  Stair step flexion stretch 10s 10x  8" lateral step down 3x6   Edu regarding self patellar and scar mobilizations, soft tissue mobilization using percussion massager, and other modalities for pain management   Treatment                            8/15  Recumbent bike half revolutions 8 min  Seated manual distraction and flexion mobilization to L knee; grade IV Scar tissue mobilization in flexion STM to L lateral HS and gastroc  Caregiver edu regarding joint mobilizations, soft tissue, and other modalities for usage at home  Stair step flexion stretch 10s 10x LLLD stretching for the knee into flexion HEP modifications    PATIENT EDUCATION:  Education details:  anatomy, exercise progression, DOMS expectations, muscle firing,  envelope of function, HEP, POC  Person educated: Patient Education method: Explanation, Demonstration, Tactile cues, Verbal cues, and Handouts Education comprehension: verbalized understanding, returned demonstration, verbal cues required, tactile cues required, and needs further education     HOME EXERCISE PROGRAM:  Access Code: [email or web address] URL: [email or web address]   ASSESSMENT:   CLINICAL IMPRESSION:  Able to progress knee flexion mob exercise at home, still some discomfort in post knee but is better in chair mob vs heel slide. Will trial aquatics tomorrow for tactile input and bring attention away from hypersensitivity of incision site.      OBJECTIVE IMPAIRMENTS decreased ROM, decreased strength, hypomobility, increased fascial restrictions, increased muscle spasms,  impaired flexibility, improper body mechanics, postural dysfunction, and pain.    ACTIVITY LIMITATIONS carrying, lifting, sitting, stairs, locomotion level   PARTICIPATION LIMITATIONS: driving, shopping, community activity, occupation, and yard work   PERSONAL FACTORS  Age, Time since onset of injury/illness/exacerbation, and 1 comorbidity:    are also affecting patient's functional outcome.        GOALS:   SHORT TERM GOALS: Target date: 04/02/2023     Pt will become independent with HEP in order to demonstrate synthesis of PT education.  Goal status: met   2.  Pt will be able to demonstrate at least 110 deg knee flexion  in order to demonstrate functional improvement in LE function for self-care and house hold duties.    Goal status: ongoing   3.  Pt will score at least 12 pt increase on FOTO to demonstrate functional improvement in MCII and pt perceived function.     Goal status: INITIAL       LONG TERM GOALS: Target date: 05/20/2023     Pt  will become independent with final HEP in order to demonstrate synthesis of PT education.    Goal status: ongoing   2.  Pt will be able to demonstrate/report ability to walk >30 mins without pain in order to demonstrate functional improvement and tolerance to exercise and community mobility.    Goal status: met   3.  Pt will be able to demonstrate full depth squat in order to demonstrate functional improvement in LE function for self-care and house hold duties.  ongoing  4.  Pt will score >/= 60 on FOTO to demonstrate improvement in perceived L knee function.  Baseline:  Goal status: ongoing  5.  Pt will be able to demonstrate full L knee ROM in order to demonstrate functional improvement in LE function for self-care and house hold duties.  ongoing      PLAN: PT FREQUENCY: 1-2x/week Additional 3rd visit in the week of 8/19-8/24/24 for aquatics appt   PT DURATION: 12 weeks    PLANNED INTERVENTIONS: Therapeutic  exercises, Therapeutic activity, Neuromuscular re-education, Balance training, Gait training, Patient/Family education, Self Care, Joint mobilization, Joint manipulation, Orthotic/Fit training, Aquatic Therapy, Dry Needling, Electrical stimulation, Spinal manipulation, Spinal mobilization, Cryotherapy, Moist heat, Compression bandaging, scar mobilization, Splintting, Taping, Vasopneumatic device, Traction, Ultrasound, Ionotophoresis 4mg /ml Dexamethasone, Manual therapy, and Re-evaluation   PLAN FOR NEXT SESSION: L knee ROM, quad, HS, and hip strength  Bobbyjo Marulanda C. Bear Osten PT, DPT 03/25/23 12:44 PM

## 2023-03-26 ENCOUNTER — Ambulatory Visit (HOSPITAL_BASED_OUTPATIENT_CLINIC_OR_DEPARTMENT_OTHER): Payer: HMO | Admitting: Physical Therapy

## 2023-03-26 ENCOUNTER — Encounter (HOSPITAL_BASED_OUTPATIENT_CLINIC_OR_DEPARTMENT_OTHER): Payer: HMO

## 2023-03-26 ENCOUNTER — Encounter (HOSPITAL_BASED_OUTPATIENT_CLINIC_OR_DEPARTMENT_OTHER): Payer: Self-pay | Admitting: Physical Therapy

## 2023-03-26 DIAGNOSIS — M6281 Muscle weakness (generalized): Secondary | ICD-10-CM

## 2023-03-26 DIAGNOSIS — M25562 Pain in left knee: Secondary | ICD-10-CM

## 2023-03-26 DIAGNOSIS — R262 Difficulty in walking, not elsewhere classified: Secondary | ICD-10-CM

## 2023-03-26 NOTE — Addendum Note (Signed)
Addended by: Fredrich Romans on: 03/26/2023 08:13 AM   Modules accepted: Orders

## 2023-03-26 NOTE — Therapy (Signed)
OUTPATIENT PHYSICAL THERAPY LOWER EXTREMITY TREATMENT   Patient Name: Ashley Blair MRN: [id number] DOB:1956-03-25, 67 y.o., female Today's Date: 03/26/2023  END OF SESSION:  PT End of Session - 03/26/23 1758     Visit Number 13    Number of Visits 18    Date for PT Re-Evaluation 05/20/23    Authorization Type HTA    PT Start Time 1445    PT Stop Time 1530    PT Time Calculation (min) 45 min    Activity Tolerance Patient tolerated treatment well    Behavior During Therapy WFL for tasks assessed/performed                     Past Medical History:  Diagnosis Date   Anxiety    Arthritis    Elevated hemoglobin A1c 2019   5.7   Family history of DES exposure    Fibrocystic breast    GERD (gastroesophageal reflux disease)    HPV (human papilloma virus) infection    Hypercholesterolemia    OCD (obsessive compulsive disorder)    Pre-diabetes    S/P excision of lipoma    Past Surgical History:  Procedure Laterality Date   ANKLE SURGERY     broken as a child   BREAST BIOPSY Right    COLONOSCOPY     DILATION AND CURETTAGE OF UTERUS     LIPOMA EXCISION     rib cage   TOTAL KNEE ARTHROPLASTY Left 02/02/2023   Procedure: LEFT TOTAL KNEE ARTHROPLASTY;  Surgeon: Tarry Kos, MD;  Location: [facility] OR;  Service: Orthopedics;  Laterality: Left;   TRIGGER FINGER RELEASE Left 03/2013   thumb   Patient Active Problem List   Diagnosis Date Noted   Status post total left knee replacement 02/02/2023   Primary osteoarthritis of left knee 02/01/2023   Family history of breast cancer in mother 12/12/2013   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   SUBJECTIVE:   SUBJECTIVE STATEMENT: No pain just tight.  Dr Roda Shutters told me yesterday that he is going to do a closed manipulation hopefully next week.  PERTINENT HISTORY: N/A PAIN:  Are you having pain? No NPRS scale: 0/10 Pain location: posterior knee anterior knee  Pain description: swelling/full Aggravating factors: bending Relieving factors: N/A, ice/rest   PRECAUTIONS: Knee  RED FLAGS: None   WEIGHT BEARING RESTRICTIONS: No  FALLS:  Has patient fallen in last 6 months? No  LIVING ENVIRONMENT: Lives with: lives with their family and lives with their spouse Lives in: House/apartment Stairs: Yes Has following equipment at home: Single point cane  OCCUPATION: Marketing executive; Office manager  PLOF: Independent  PATIENT GOALS: return to normal exercise    OBJECTIVE:   DIAGNOSTIC FINDINGS: N/A  PATIENT SURVEYS:  FOTO 39 60 @ DC 12 MCII   COGNITION: Overall cognitive status: Within functional limits for tasks assessed     SENSATION: WFL  EDEMA:  Moderate, palpable edema anteriorly and posteriorly into popliteal fossa  POSTURE: No Significant postural  limitations  PALPATION: TTP of medial and lateral knee; popliteal space; calf supple  LOWER EXTREMITY ROM:  Active ROM Right eval Left eval L 7/20 L 7/24 Lt 8/5 Lt  8/5 8/19  Hip flexion         Hip extension         Hip abduction         Hip adduction         Hip internal rotation         Hip external rotation         Knee flexion 125 75 78 86 95 91 101 CKC   Knee extension 3 -12 -5   1 0  Ankle dorsiflexion         Ankle plantarflexion         Ankle inversion         Ankle eversion          (Blank rows = not tested)   TODAY'S TREATMENT:  Treatment                            8/22: Pt seen for aquatic therapy today.  Treatment took place in water 3.5-4.75 ft in  depth at the [facility] pool. Temp of water was 91.  Pt entered/exited the pool via stairs using step to pattern with hand rail rail.  *walking forward, back with cues for heel strike *quad and hip flex stretch suing noodle *3 way stretch using solid noodle: hamstring, gastroc, adductor and IT band *STS from 3rd then 4th step from bottom Noodle kick down yellow hip in neutral position then externally rotation 10 slow then 10 fast ea position *cycling on noodle x 6 widths; hip add/abd; hip flex/ext *Quad stretch on 2nd step 5 pulses then static hold x 5 *Step up onto 2nd step cues for eccentric lowering and controlled left knee extension     Pt requires the buoyancy and hydrostatic pressure of water for support, and to offload joints by unweighting joint load by at least 50 % in navel deep water and by at least 75-80% in chest to neck deep water.  Viscosity of the water is needed for resistance of strengthening. Water current perturbations provides challenge to standing balance requiring increased core activation.     Treatment                            8/21:  Bike 5 min half revolutions Scar mob: IASTM & rolling proximal 1/3 incision Standing quad stretch with foot on table behind Heel raise, toe squat neutral & turnout Squat to table tap, also added hold 105 deg flx in heel slide, 100 lunging in chair   Treatment                            8/19  Recumbent bike half revolutions 8 min  Seated manual distraction and flexion mobilization to L knee; grade IV L knee patellar mobs inf/sup and ML garde IV STM to L lateral HS and gastroc  Seated HS stretch on table 30s 3x  Stair step flexion stretch 10s 10x  8" lateral step down 3x6   Edu regarding self patellar and scar mobilizations, soft tissue mobilization using percussion massager, and other modalities for pain management   Treatment  8/15  Recumbent bike half revolutions 8  min  Seated manual distraction and flexion mobilization to L knee; grade IV Scar tissue mobilization in flexion STM to L lateral HS and gastroc  Caregiver edu regarding joint mobilizations, soft tissue, and other modalities for usage at home  Stair step flexion stretch 10s 10x LLLD stretching for the knee into flexion HEP modifications    PATIENT EDUCATION:  Education details:  anatomy, exercise progression, DOMS expectations, muscle firing,  envelope of function, HEP, POC  Person educated: Patient Education method: Explanation, Demonstration, Tactile cues, Verbal cues, and Handouts Education comprehension: verbalized understanding, returned demonstration, verbal cues required, tactile cues required, and needs further education     HOME EXERCISE PROGRAM:  Access Code: [email or web address] URL: [email or web address]   ASSESSMENT:   CLINICAL IMPRESSION: Pt tolerates first aquatic session well. Focused on L knee flex through stretching and strengthening.  Warm water does appear to distract pt from hypersensitivity about incision site. She will be having a closed manipulation by Dr Roda Shutters she is anticipating in the next week.  Goals ongoing         OBJECTIVE IMPAIRMENTS decreased ROM, decreased strength, hypomobility, increased fascial restrictions, increased muscle spasms, impaired flexibility, improper body mechanics, postural dysfunction, and pain.    ACTIVITY LIMITATIONS carrying, lifting, sitting, stairs, locomotion level   PARTICIPATION LIMITATIONS: driving, shopping, community activity, occupation, and yard work   PERSONAL FACTORS Age, Time since onset of injury/illness/exacerbation, and 1 comorbidity:    are also affecting patient's functional outcome.        GOALS:   SHORT TERM GOALS: Target date: 04/02/2023     Pt will become independent with HEP in order to demonstrate synthesis of PT education.  Goal status: met   2.  Pt will be able to demonstrate at  least 110 deg knee flexion  in order to demonstrate functional improvement in LE function for self-care and house hold duties.    Goal status: ongoing   3.  Pt will score at least 12 pt increase on FOTO to demonstrate functional improvement in MCII and pt perceived function.     Goal status: INITIAL       LONG TERM GOALS: Target date: 05/20/2023     Pt  will become independent with final HEP in order to demonstrate synthesis of PT education.    Goal status: ongoing   2.  Pt will be able to demonstrate/report ability to walk >30 mins without pain in order to demonstrate functional improvement and tolerance to exercise and community mobility.    Goal status: met   3.  Pt will be able to demonstrate full depth squat in order to demonstrate functional improvement in LE function for self-care and house hold duties.  ongoing  4.  Pt will score >/= 60 on FOTO to demonstrate improvement in perceived L knee function.  Baseline:  Goal status: ongoing  5.  Pt will be able to demonstrate full L knee ROM in order to demonstrate functional improvement in LE function for self-care and house hold duties.  ongoing      PLAN: PT FREQUENCY: 1-2x/week Additional 3rd visit in the week of 8/19-8/24/24 for aquatics appt   PT DURATION: 12 weeks    PLANNED INTERVENTIONS: Therapeutic exercises, Therapeutic activity, Neuromuscular re-education, Balance training, Gait training, Patient/Family education, Self Care, Joint mobilization, Joint manipulation, Orthotic/Fit training, Aquatic Therapy, Dry Needling, Electrical stimulation, Spinal manipulation, Spinal mobilization, Cryotherapy, Moist heat, Compression bandaging, scar mobilization, Splintting, Taping, Vasopneumatic device, Traction, Ultrasound, Ionotophoresis  4mg /ml Dexamethasone, Manual therapy, and Re-evaluation   PLAN FOR NEXT SESSION: L knee ROM, quad, HS, and hip strength  Corrie Dandy Tomma Lightning) Dallana Mavity MPT 03/26/23 5:59 PM

## 2023-03-30 ENCOUNTER — Encounter (HOSPITAL_BASED_OUTPATIENT_CLINIC_OR_DEPARTMENT_OTHER): Payer: Self-pay

## 2023-03-30 ENCOUNTER — Ambulatory Visit (HOSPITAL_BASED_OUTPATIENT_CLINIC_OR_DEPARTMENT_OTHER): Payer: HMO

## 2023-03-30 DIAGNOSIS — M25562 Pain in left knee: Secondary | ICD-10-CM

## 2023-03-30 DIAGNOSIS — M6281 Muscle weakness (generalized): Secondary | ICD-10-CM

## 2023-03-30 DIAGNOSIS — R262 Difficulty in walking, not elsewhere classified: Secondary | ICD-10-CM

## 2023-03-30 NOTE — Therapy (Signed)
OUTPATIENT PHYSICAL THERAPY LOWER EXTREMITY TREATMENT   Patient Name: Ashley Blair MRN: [id number] DOB:Apr 26, 1956, 67 y.o., female Today's Date: 03/30/2023  END OF SESSION:  PT End of Session - 03/30/23 1308     Visit Number 14    Number of Visits 18    Date for PT Re-Evaluation 05/20/23    Authorization Type HTA    PT Start Time 1018    PT Stop Time 1103    PT Time Calculation (min) 45 min    Activity Tolerance Patient tolerated treatment well    Behavior During Therapy WFL for tasks assessed/performed                      Past Medical History:  Diagnosis Date   Anxiety    Arthritis    Elevated hemoglobin A1c 2019   5.7   Family history of DES exposure    Fibrocystic breast    GERD (gastroesophageal reflux disease)    HPV (human papilloma virus) infection    Hypercholesterolemia    OCD (obsessive compulsive disorder)    Pre-diabetes    S/P excision of lipoma    Past Surgical History:  Procedure Laterality Date   ANKLE SURGERY     broken as a child   BREAST BIOPSY Right    COLONOSCOPY     DILATION AND CURETTAGE OF UTERUS     LIPOMA EXCISION     rib cage   TOTAL KNEE ARTHROPLASTY Left 02/02/2023   Procedure: LEFT TOTAL KNEE ARTHROPLASTY;  Surgeon: Tarry Kos, MD;  Location: [facility] OR;  Service: Orthopedics;  Laterality: Left;   TRIGGER FINGER RELEASE Left 03/2013   thumb   Patient Active Problem List   Diagnosis Date Noted   Status post total left knee replacement 02/02/2023   Primary osteoarthritis of left knee 02/01/2023   Family history of breast cancer in mother 12/12/2013   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   SUBJECTIVE:   SUBJECTIVE STATEMENT: Having manipulation this Thursday 8/29. Waking up due to posterior knee pain/spasm. Has been compliant with HEP.   PERTINENT HISTORY: N/A PAIN:  Are you having pain? No NPRS scale: 0/10 Pain location: posterior knee anterior knee  Pain description: swelling/full Aggravating factors: bending Relieving factors: N/A, ice/rest   PRECAUTIONS: Knee  RED FLAGS: None   WEIGHT BEARING RESTRICTIONS: No  FALLS:  Has patient fallen in last 6 months? No  LIVING ENVIRONMENT: Lives with: lives with their family and lives with their spouse Lives in: House/apartment Stairs: Yes Has following equipment at home: Single point cane  OCCUPATION: Marketing executive; Office manager  PLOF: Independent  PATIENT GOALS: return to normal exercise    OBJECTIVE:   DIAGNOSTIC FINDINGS: N/A  PATIENT SURVEYS:  FOTO 39 60 @ DC 12 MCII   COGNITION: Overall cognitive status: Within functional limits for tasks assessed     SENSATION: WFL  EDEMA:  Moderate, palpable edema anteriorly and posteriorly into popliteal fossa  POSTURE: No Significant postural limitations  PALPATION:  TTP of medial and lateral knee; popliteal space; calf supple  LOWER EXTREMITY ROM:  Active ROM Right eval Left eval L 7/20 L 7/24 Lt 8/5 Lt  8/5 8/19  Hip flexion         Hip extension         Hip abduction         Hip adduction         Hip internal rotation         Hip external rotation         Knee flexion 125 75 78 86 95 91 101 CKC   Knee extension 3 -12 -5   1 0  Ankle dorsiflexion         Ankle plantarflexion         Ankle inversion         Ankle eversion          (Blank rows = not tested)   TODAY'S TREATMENT:   Treatment                            8/26:  Bike 6 min half revolutions Discussion of manipulation and PT  schedule/frequency STM and IASTM to tib anterior and distal lateral HS PROM L knee LAQ 3# 5" hold x20   Treatment                            8/22: Pt seen for aquatic therapy today.  Treatment took place in water 3.5-4.75 ft in depth at the [facility] pool. Temp of water was 91.  Pt entered/exited the pool via stairs using step to pattern with hand rail rail.  *walking forward, back with cues for heel strike *quad and hip flex stretch suing noodle *3 way stretch using solid noodle: hamstring, gastroc, adductor and IT band *STS from 3rd then 4th step from bottom Noodle kick down yellow hip in neutral position then externally rotation 10 slow then 10 fast ea position *cycling on noodle x 6 widths; hip add/abd; hip flex/ext *Quad stretch on 2nd step 5 pulses then static hold x 5 *Step up onto 2nd step cues for eccentric lowering and controlled left knee extension     Pt requires the buoyancy and hydrostatic pressure of water for support, and to offload joints by unweighting joint load by at least 50 % in navel deep water and by at least 75-80% in chest to neck deep water.  Viscosity of the water is needed for resistance of strengthening. Water current perturbations provides challenge to standing balance requiring increased core activation.     Treatment                            8/21:  Bike 5 min half revolutions Scar mob: IASTM & rolling proximal 1/3 incision Standing quad stretch with foot on table behind Heel raise, toe squat neutral & turnout Squat to table tap, also added hold 105 deg flx in heel slide, 100 lunging in chair   Treatment                            8/19  Recumbent bike half revolutions 8 min  Seated manual distraction and flexion mobilization to L knee; grade IV L knee patellar mobs inf/sup and ML garde IV STM to L lateral HS and  gastroc  Seated HS stretch on table 30s 3x  Stair step flexion stretch 10s 10x  8" lateral step down 3x6   Edu  regarding self patellar and scar mobilizations, soft tissue mobilization using percussion massager, and other modalities for pain management   Treatment                            8/15  Recumbent bike half revolutions 8 min  Seated manual distraction and flexion mobilization to L knee; grade IV Scar tissue mobilization in flexion STM to L lateral HS and gastroc  Caregiver edu regarding joint mobilizations, soft tissue, and other modalities for usage at home  Stair step flexion stretch 10s 10x LLLD stretching for the knee into flexion HEP modifications    PATIENT EDUCATION:  Education details:  anatomy, exercise progression, DOMS expectations, muscle firing,  envelope of function, HEP, POC  Person educated: Patient Education method: Explanation, Demonstration, Tactile cues, Verbal cues, and Handouts Education comprehension: verbalized understanding, returned demonstration, verbal cues required, tactile cues required, and needs further education     HOME EXERCISE PROGRAM:  Access Code: [email or web address] URL: [email or web address]   ASSESSMENT:   CLINICAL IMPRESSION: Spent time discussing manipulation procedure and expectations following this in regards to PT visits and HEP. Will provide detailed HEP for ROM exercises at next visit. Pt remains stiff into knee flexion as well as extension initially. Knee extension improves to full after short period of stretching. With knee flexion, she initially had ~90deg available that increased to ~105deg following stretching and PROM. Pt will be scheduled with increased frequency of visits following her manipulation on Thursday.        OBJECTIVE IMPAIRMENTS decreased ROM, decreased strength, hypomobility, increased fascial restrictions, increased muscle spasms, impaired flexibility, improper body mechanics, postural dysfunction, and pain.    ACTIVITY LIMITATIONS carrying, lifting, sitting, stairs, locomotion level   PARTICIPATION  LIMITATIONS: driving, shopping, community activity, occupation, and yard work   PERSONAL FACTORS Age, Time since onset of injury/illness/exacerbation, and 1 comorbidity:    are also affecting patient's functional outcome.        GOALS:   SHORT TERM GOALS: Target date: 04/02/2023     Pt will become independent with HEP in order to demonstrate synthesis of PT education.  Goal status: met   2.  Pt will be able to demonstrate at least 110 deg knee flexion  in order to demonstrate functional improvement in LE function for self-care and house hold duties.    Goal status: ongoing   3.  Pt will score at least 12 pt increase on FOTO to demonstrate functional improvement in MCII and pt perceived function.     Goal status: INITIAL       LONG TERM GOALS: Target date: 05/20/2023     Pt  will become independent with final HEP in order to demonstrate synthesis of PT education.    Goal status: ongoing   2.  Pt will be able to demonstrate/report ability to walk >30 mins without pain in order to demonstrate functional improvement and tolerance to exercise and community mobility.    Goal status: met   3.  Pt will be able to demonstrate full depth squat in order to demonstrate functional improvement in LE function for self-care and house hold duties.  ongoing  4.  Pt will score >/= 60 on FOTO to demonstrate improvement in perceived L knee function.  Baseline:  Goal status: ongoing  5.  Pt  will be able to demonstrate full L knee ROM in order to demonstrate functional improvement in LE function for self-care and house hold duties.  ongoing      PLAN: PT FREQUENCY: 1-2x/week Additional 3rd visit in the week of 8/19-8/24/24 for aquatics appt   PT DURATION: 12 weeks    PLANNED INTERVENTIONS: Therapeutic exercises, Therapeutic activity, Neuromuscular re-education, Balance training, Gait training, Patient/Family education, Self Care, Joint mobilization, Joint manipulation, Orthotic/Fit  training, Aquatic Therapy, Dry Needling, Electrical stimulation, Spinal manipulation, Spinal mobilization, Cryotherapy, Moist heat, Compression bandaging, scar mobilization, Splintting, Taping, Vasopneumatic device, Traction, Ultrasound, Ionotophoresis 4mg /ml Dexamethasone, Manual therapy, and Re-evaluation   PLAN FOR NEXT SESSION: L knee ROM, quad, HS, and hip strength  Riki Altes, PTA  03/30/23 1:09 PM

## 2023-03-31 ENCOUNTER — Other Ambulatory Visit: Payer: Self-pay | Admitting: Orthopaedic Surgery

## 2023-03-31 ENCOUNTER — Other Ambulatory Visit: Payer: Self-pay | Admitting: Physician Assistant

## 2023-03-31 DIAGNOSIS — Z96652 Presence of left artificial knee joint: Secondary | ICD-10-CM

## 2023-03-31 MED ORDER — HYDROCODONE-ACETAMINOPHEN 5-325 MG PO TABS: 1.0000 | ORAL_TABLET | Freq: Three times a day (TID) | ORAL | 0 refills | Status: DC | PRN

## 2023-03-31 MED ORDER — ONDANSETRON HCL 4 MG PO TABS: 4.0000 mg | ORAL_TABLET | Freq: Three times a day (TID) | ORAL | 0 refills | Status: DC | PRN

## 2023-03-31 NOTE — Therapy (Signed)
OUTPATIENT PHYSICAL THERAPY LOWER EXTREMITY TREATMENT   Patient Name: Ashley Blair MRN: [id number] DOB:Mar 18, 1956, 67 y.o., female Today's Date: 04/01/2023  END OF SESSION:  PT End of Session - 04/01/23 1015     Visit Number 15    Number of Visits --    Date for PT Re-Evaluation 05/27/23    Authorization Type HTA    PT Start Time 1014    PT Stop Time 1057    PT Time Calculation (min) 43 min    Activity Tolerance Patient tolerated treatment well    Behavior During Therapy WFL for tasks assessed/performed                       Past Medical History:  Diagnosis Date   Anxiety    Arthritis    Elevated hemoglobin A1c 2019   5.7   Family history of DES exposure    Fibrocystic breast    GERD (gastroesophageal reflux disease)    HPV (human papilloma virus) infection    Hypercholesterolemia    OCD (obsessive compulsive disorder)    Pre-diabetes    S/P excision of lipoma    Past Surgical History:  Procedure Laterality Date   ANKLE SURGERY     broken as a child   BREAST BIOPSY Right    COLONOSCOPY     DILATION AND CURETTAGE OF UTERUS     LIPOMA EXCISION     rib cage   TOTAL KNEE ARTHROPLASTY Left 02/02/2023   Procedure: LEFT TOTAL KNEE ARTHROPLASTY;  Surgeon: Tarry Kos, MD;  Location: [facility] OR;  Service: Orthopedics;  Laterality: Left;   TRIGGER FINGER RELEASE Left 03/2013   thumb   Patient Active Problem List   Diagnosis Date Noted   Status post total left knee replacement 02/02/2023   Primary osteoarthritis of left knee 02/01/2023   Family history of breast cancer in mother 12/12/2013   H/O diethylstilbestrol (DES) exposure in utero 12/12/2013    PCP:  Thana Ates, MD       REFERRING PROVIDER:  Tarry Kos, MD     REFERRING DIAG:  [phone] (ICD-10-CM) - Primary osteoarthritis of left knee      THERAPY DIAG:  Muscle weakness (generalized)  Acute pain of left knee  Difficulty walking  Rationale for Evaluation and Treatment:  Rehabilitation  ONSET DATE: 02/02/23 DOS  Days since surgery: 53   Operative procedure: Left total knee arthroplasty.   SUBJECTIVE:   SUBJECTIVE STATEMENT: I can warm it up but then later it gets so tight.    PERTINENT HISTORY: N/A PAIN:  Are you having pain? No NPRS scale: 0/10 Pain location: posterior knee anterior knee  Pain description: swelling/full Aggravating factors: bending Relieving factors: N/A, ice/rest   PRECAUTIONS: Knee  RED FLAGS: None   WEIGHT BEARING RESTRICTIONS: No  FALLS:  Has patient fallen in last 6 months? No  LIVING ENVIRONMENT: Lives with: lives with their family and lives with their spouse Lives in: House/apartment Stairs: Yes Has following equipment at home: Single point cane  OCCUPATION: Marketing executive; Office manager  PLOF: Independent  PATIENT GOALS: return to normal exercise    OBJECTIVE:   DIAGNOSTIC FINDINGS: N/A  PATIENT SURVEYS:  FOTO 39 60 @ DC 12 MCII   COGNITION: Overall cognitive status: Within functional limits for tasks assessed     SENSATION: WFL  EDEMA:  Moderate, palpable edema anteriorly and posteriorly into popliteal fossa  POSTURE: No Significant postural limitations  PALPATION: TTP of medial  and lateral knee; popliteal space; calf supple  LOWER EXTREMITY ROM:  Active ROM Right eval Left eval L 7/20 L 7/24 Lt 8/5 Lt  8/5 8/19  Hip flexion         Hip extension         Hip abduction         Hip adduction         Hip internal rotation         Hip external rotation         Knee flexion 125 75 78 86 95 91 101 CKC   Knee extension 3 -12 -5   1 0  Ankle dorsiflexion         Ankle plantarflexion         Ankle inversion         Ankle eversion          (Blank rows = not tested)   TODAY'S TREATMENT:   Treatment                            8/28:  See post op HEP   Treatment                            8/26:  Bike 6 min half revolutions Discussion of manipulation and PT  schedule/frequency STM and IASTM to tib anterior and distal lateral HS PROM L knee LAQ 3# 5" hold x20   Treatment                            8/22: Pt seen for aquatic therapy today.  Treatment took place in water 3.5-4.75 ft in depth at the [facility] pool. Temp of water was 91.  Pt entered/exited the pool via stairs using step to pattern with hand rail rail.  *walking forward, back with cues for heel strike *quad and hip flex stretch suing noodle *3 way stretch using solid noodle: hamstring, gastroc, adductor and IT band *STS from 3rd then 4th step from bottom Noodle kick down yellow hip in neutral position then externally rotation 10 slow then 10 fast ea position *cycling on noodle x 6 widths; hip add/abd; hip flex/ext *Quad stretch on 2nd step 5 pulses then static hold x 5 *Step up onto 2nd step cues for eccentric lowering and controlled left knee extension     Pt requires the buoyancy and hydrostatic pressure of water for support, and to offload joints by unweighting joint load by at least 50 % in navel deep water and by at least 75-80% in chest to neck deep water.  Viscosity of the water is needed for resistance of strengthening. Water current perturbations provides challenge to standing balance requiring increased core activation.      PATIENT EDUCATION:  Education details:  anatomy, exercise progression, DOMS expectations, muscle firing,  envelope of function, HEP, POC  Person educated: Patient Education method: Explanation, Demonstration, Tactile cues, Verbal cues, and Handouts Education comprehension: verbalized understanding, returned demonstration, verbal cues required, tactile cues required, and needs further education     HOME EXERCISE PROGRAM:  Access Code: [email or web address] URL: [email or web address] Post op program   ASSESSMENT:   CLINICAL IMPRESSION: Reviewed HEP for immediately post manip. Encouraged her to contact me with any  questions.      OBJECTIVE IMPAIRMENTS decreased ROM, decreased strength, hypomobility, increased fascial restrictions, increased muscle spasms,  impaired flexibility, improper body mechanics, postural dysfunction, and pain.    ACTIVITY LIMITATIONS carrying, lifting, sitting, stairs, locomotion level   PARTICIPATION LIMITATIONS: driving, shopping, community activity, occupation, and yard work   PERSONAL FACTORS Age, Time since onset of injury/illness/exacerbation, and 1 comorbidity:    are also affecting patient's functional outcome.        GOALS:   SHORT TERM GOALS: Target date: 04/02/2023     Pt will become independent with HEP in order to demonstrate synthesis of PT education.  Goal status: met   2.  Pt will be able to demonstrate at least 110 deg knee flexion  in order to demonstrate functional improvement in LE function for self-care and house hold duties.    Goal status: ongoing   3.  Pt will score at least 12 pt increase on FOTO to demonstrate functional improvement in MCII and pt perceived function.     Goal status: INITIAL       LONG TERM GOALS: Target date: 05/20/2023     Pt  will become independent with final HEP in order to demonstrate synthesis of PT education.    Goal status: ongoing   2.  Pt will be able to demonstrate/report ability to walk >30 mins without pain in order to demonstrate functional improvement and tolerance to exercise and community mobility.    Goal status: met   3.  Pt will be able to demonstrate full depth squat in order to demonstrate functional improvement in LE function for self-care and house hold duties.  ongoing  4.  Pt will score >/= 60 on FOTO to demonstrate improvement in perceived L knee function.  Baseline:  Goal status: ongoing  5.  Pt will be able to demonstrate full L knee ROM in order to demonstrate functional improvement in LE function for self-care and house hold duties.  ongoing      PLAN: PT FREQUENCY:  1-2x/week Additional 3rd visit in the week of 8/19-8/24/24 for aquatics appt 6/week for 2 weeks &2-3/week following   PT DURATION: POC date   PLANNED INTERVENTIONS: Therapeutic exercises, Therapeutic activity, Neuromuscular re-education, Balance training, Gait training, Patient/Family education, Self Care, Joint mobilization, Joint manipulation, Orthotic/Fit training, Aquatic Therapy, Dry Needling, Electrical stimulation, Spinal manipulation, Spinal mobilization, Cryotherapy, Moist heat, Compression bandaging, scar mobilization, Splintting, Taping, Vasopneumatic device, Traction, Ultrasound, Ionotophoresis 4mg /ml Dexamethasone, Manual therapy, and Re-evaluation   PLAN FOR NEXT SESSION: L knee ROM, quad, HS, and hip strength  Timmia Cogburn C. Shalaya Swailes PT, DPT 04/01/23 10:58 AM

## 2023-04-01 ENCOUNTER — Encounter (HOSPITAL_BASED_OUTPATIENT_CLINIC_OR_DEPARTMENT_OTHER): Payer: Self-pay | Admitting: Physical Therapy

## 2023-04-01 ENCOUNTER — Ambulatory Visit (HOSPITAL_BASED_OUTPATIENT_CLINIC_OR_DEPARTMENT_OTHER): Payer: HMO | Admitting: Physical Therapy

## 2023-04-01 DIAGNOSIS — M6281 Muscle weakness (generalized): Secondary | ICD-10-CM | POA: Diagnosis not present

## 2023-04-01 DIAGNOSIS — R262 Difficulty in walking, not elsewhere classified: Secondary | ICD-10-CM

## 2023-04-01 DIAGNOSIS — M25562 Pain in left knee: Secondary | ICD-10-CM

## 2023-04-02 DIAGNOSIS — M24662 Ankylosis, left knee: Secondary | ICD-10-CM | POA: Diagnosis not present

## 2023-04-03 ENCOUNTER — Encounter (HOSPITAL_BASED_OUTPATIENT_CLINIC_OR_DEPARTMENT_OTHER): Payer: Self-pay | Admitting: Physical Therapy

## 2023-04-03 ENCOUNTER — Ambulatory Visit (HOSPITAL_BASED_OUTPATIENT_CLINIC_OR_DEPARTMENT_OTHER): Payer: HMO | Admitting: Physical Therapy

## 2023-04-03 DIAGNOSIS — M6281 Muscle weakness (generalized): Secondary | ICD-10-CM

## 2023-04-03 DIAGNOSIS — R262 Difficulty in walking, not elsewhere classified: Secondary | ICD-10-CM

## 2023-04-03 DIAGNOSIS — M25562 Pain in left knee: Secondary | ICD-10-CM

## 2023-04-03 NOTE — Therapy (Signed)
OUTPATIENT PHYSICAL THERAPY LOWER EXTREMITY TREATMENT   Patient Name: Ashley Blair MRN: [id number] DOB:15-Jul-1956, 67 y.o., female Today's Date: 04/03/2023  END OF SESSION:  PT End of Session - 04/03/23 1329     Visit Number 1    Number of Visits 16    Date for PT Re-Evaluation 05/29/23    PT Start Time 1147    PT Stop Time 1226    PT Time Calculation (min) 39 min    Activity Tolerance Patient tolerated treatment well    Behavior During Therapy WFL for tasks assessed/performed                       Past Medical History:  Diagnosis Date   Anxiety    Arthritis    Elevated hemoglobin A1c 2019   5.7   Family history of DES exposure    Fibrocystic breast    GERD (gastroesophageal reflux disease)    HPV (human papilloma virus) infection    Hypercholesterolemia    OCD (obsessive compulsive disorder)    Pre-diabetes    S/P excision of lipoma    Past Surgical History:  Procedure Laterality Date   ANKLE SURGERY     broken as a child   BREAST BIOPSY Right    COLONOSCOPY     DILATION AND CURETTAGE OF UTERUS     LIPOMA EXCISION     rib cage   TOTAL KNEE ARTHROPLASTY Left 02/02/2023   Procedure: LEFT TOTAL KNEE ARTHROPLASTY;  Surgeon: Tarry Kos, MD;  Location: [facility] OR;  Service: Orthopedics;  Laterality: Left;   TRIGGER FINGER RELEASE Left 03/2013   thumb   Patient Active Problem List   Diagnosis Date Noted   Status post total left knee replacement 02/02/2023   Primary osteoarthritis of left knee 02/01/2023   Family history of breast cancer in mother 12/12/2013   H/O diethylstilbestrol (DES) exposure in utero 12/12/2013    PCP:  Thana Ates, MD       REFERRING PROVIDER:  Tarry Kos, MD     REFERRING DIAG:  [phone] (ICD-10-CM) - Primary osteoarthritis of left knee      THERAPY DIAG:  Muscle weakness (generalized)  Acute pain of left knee  Difficulty walking  Rationale for Evaluation and Treatment: Rehabilitation  ONSET DATE:  02/02/23 DOS Manipulation 8/29    Days since surgery: 53   Operative procedure: Left total knee arthroplasty.   SUBJECTIVE:   SUBJECTIVE STATEMENT: Te patient has a manipulation on 04/02/2023. She is having mild pain and swelling at this time. She reports it is stiff. She I having pain in the medial portion of the knee. She has been icing and elevating   PERTINENT HISTORY: N/A PAIN:  Are you having pain? No NPRS scale: 0/10 Pain location: posterior knee anterior knee  Pain description: swelling/full Aggravating factors: bending Relieving factors: N/A, ice/rest   PRECAUTIONS: Knee  RED FLAGS: None   WEIGHT BEARING RESTRICTIONS: No  FALLS:  Has patient fallen in last 6 months? No  LIVING ENVIRONMENT: Lives with: lives with their family and lives with their spouse Lives in: House/apartment Stairs: Yes Has following equipment at home: Single point cane  OCCUPATION: Marketing executive; Office manager  PLOF: Independent  PATIENT GOALS: return to normal exercise    OBJECTIVE:   DIAGNOSTIC FINDINGS: N/A  PATIENT SURVEYS:  FOTO 39 60 @ DC 12 MCII   COGNITION: Overall cognitive status: Within functional limits for tasks assessed     SENSATION:  WFL  EDEMA:  Moderate, palpable edema anteriorly and posteriorly into popliteal fossa  POSTURE: No Significant postural limitations  PALPATION: TTP of medial and lateral knee; popliteal space; calf supple  LOWER EXTREMITY ROM:  Active ROM Right eval Left eval L 7/20 L 7/24 Lt 8/5 Lt  8/5 8/19 8/30  Hip flexion          Hip extension          Hip abduction          Hip adduction          Hip internal rotation          Hip external rotation          Knee flexion 125 75 78 86 95 91 101 CKC  107  Knee extension 3 -12 -5   1 0 0  Ankle dorsiflexion          Ankle plantarflexion          Ankle inversion          Ankle eversion           (Blank rows = not tested)   TODAY'S TREATMENT:  8/30 Manual: PROM  into flexion; Grade II and III PA and AP mobilization; STM to medial and lateral quad; Edema massage   Reviewed self stretch for home using strap 5x 5 sec hold    Treatment                            8/28:  See post op HEP   Treatment                            8/26:  Bike 6 min half revolutions Discussion of manipulation and PT schedule/frequency STM and IASTM to tib anterior and distal lateral HS PROM L knee LAQ 3# 5" hold x20   Treatment                            8/22: Pt seen for aquatic therapy today.  Treatment took place in water 3.5-4.75 ft in depth at the [facility] pool. Temp of water was 91.  Pt entered/exited the pool via stairs using step to pattern with hand rail rail.  *walking forward, back with cues for heel strike *quad and hip flex stretch suing noodle *3 way stretch using solid noodle: hamstring, gastroc, adductor and IT band *STS from 3rd then 4th step from bottom Noodle kick down yellow hip in neutral position then externally rotation 10 slow then 10 fast ea position *cycling on noodle x 6 widths; hip add/abd; hip flex/ext *Quad stretch on 2nd step 5 pulses then static hold x 5 *Step up onto 2nd step cues for eccentric lowering and controlled left knee extension     Pt requires the buoyancy and hydrostatic pressure of water for support, and to offload joints by unweighting joint load by at least 50 % in navel deep water and by at least 75-80% in chest to neck deep water.  Viscosity of the water is needed for resistance of strengthening. Water current perturbations provides challenge to standing balance requiring increased core activation.      PATIENT EDUCATION:  Education details:  anatomy, exercise progression, DOMS expectations, muscle firing,  envelope of function, HEP, POC  Person educated: Patient Education method: Explanation, Demonstration, Tactile cues, Verbal cues, and Handouts  Education comprehension: verbalized understanding,  returned demonstration, verbal cues required, tactile cues required, and needs further education     HOME EXERCISE PROGRAM:  Access Code: [email or web address] URL: [email or web address] Post op program   ASSESSMENT:   CLINICAL IMPRESSION: The patient tolerated treatment well. She has had a significant improvement in PROM from last measurement. She is having some tightness.  Her range of motion started around 90 degrees but improved to 107 with manual therapy.  She is advised to 3-4 times a day work on flexion.  She is given another HEP to do after manipulation.  She is advised if she does do that to make sure she is still doing her her flexion stretch 3-4 times a day.  She is advised this is the most important thing for her to do at this time.  All of the stretches and exercises we can work on at a later time.  She would benefit from further skilled therapy 4-5 times a week for the first 2 weeks and then 2-3 times a week for the next 4 weeks following.  Her pain is well-controlled at this time.  She has been icing and elevating for long periods of time.  She is advised as long as her pain is under control she does not need to ice and keep it elevated as much.  She can start walking around her house more and this may be more beneficial than staying in 1 spot with ice on.    OBJECTIVE IMPAIRMENTS decreased ROM, decreased strength, hypomobility, increased fascial restrictions, increased muscle spasms, impaired flexibility, improper body mechanics, postural dysfunction, and pain.    ACTIVITY LIMITATIONS carrying, lifting, sitting, stairs, locomotion level   PARTICIPATION LIMITATIONS: driving, shopping, community activity, occupation, and yard work   PERSONAL FACTORS Age, Time since onset of injury/illness/exacerbation, and 1 comorbidity:    are also affecting patient's functional outcome.        GOALS:   SHORT TERM GOALS: Target date: 04/02/2023     Pt will become independent  with HEP in order to demonstrate synthesis of PT education.  Goal status: met   2.  Pt will be able to demonstrate at least 110 deg knee flexion  in order to demonstrate functional improvement in LE function for self-care and house hold duties.    Goal status: ongoing   3.  Pt will score at least 12 pt increase on FOTO to demonstrate functional improvement in MCII and pt perceived function.     Goal status: INITIAL       LONG TERM GOALS: Target date: 05/20/2023     Pt  will become independent with final HEP in order to demonstrate synthesis of PT education.    Goal status: ongoing   2.  Pt will be able to demonstrate/report ability to walk >30 mins without pain in order to demonstrate functional improvement and tolerance to exercise and community mobility.    Goal status: met   3.  Pt will be able to demonstrate full depth squat in order to demonstrate functional improvement in LE function for self-care and house hold duties.  ongoing  4.  Pt will score >/= 60 on FOTO to demonstrate improvement in perceived L knee function.  Baseline:  Goal status: ongoing  5.  Pt will be able to demonstrate full L knee ROM in order to demonstrate functional improvement in LE function for self-care and house hold duties.  ongoing      PLAN: PT FREQUENCY: 1-2x/week Additional 3rd visit  in the week of 8/19-8/24/24 for aquatics appt 6/week for 2 weeks &2-3/week following   PT DURATION: POC date   PLANNED INTERVENTIONS: Therapeutic exercises, Therapeutic activity, Neuromuscular re-education, Balance training, Gait training, Patient/Family education, Self Care, Joint mobilization, Joint manipulation, Orthotic/Fit training, Aquatic Therapy, Dry Needling, Electrical stimulation, Spinal manipulation, Spinal mobilization, Cryotherapy, Moist heat, Compression bandaging, scar mobilization, Splintting, Taping, Vasopneumatic device, Traction, Ultrasound, Ionotophoresis 4mg /ml Dexamethasone, Manual  therapy, and Re-evaluation   PLAN FOR NEXT SESSION: PROM into flexion, work back into there-ex as tolerated.  Lorayne Bender PT DPT 04/03/23 1:34 PM

## 2023-04-04 ENCOUNTER — Encounter (HOSPITAL_BASED_OUTPATIENT_CLINIC_OR_DEPARTMENT_OTHER): Payer: Self-pay | Admitting: Physical Therapy

## 2023-04-04 ENCOUNTER — Ambulatory Visit (HOSPITAL_BASED_OUTPATIENT_CLINIC_OR_DEPARTMENT_OTHER): Payer: HMO | Admitting: Physical Therapy

## 2023-04-04 DIAGNOSIS — M6281 Muscle weakness (generalized): Secondary | ICD-10-CM

## 2023-04-04 DIAGNOSIS — R262 Difficulty in walking, not elsewhere classified: Secondary | ICD-10-CM

## 2023-04-04 DIAGNOSIS — M25562 Pain in left knee: Secondary | ICD-10-CM

## 2023-04-04 NOTE — Therapy (Signed)
OUTPATIENT PHYSICAL THERAPY LOWER EXTREMITY TREATMENT   Patient Name: Ashley Blair MRN: [id number] DOB:Dec 21, 1955, 67 y.o., female Today's Date: 04/04/2023  END OF SESSION:  PT End of Session - 04/04/23 0953     Visit Number 2    Number of Visits 16    Date for PT Re-Evaluation 05/29/23    Authorization Type HTA    PT Start Time 0830    PT Stop Time 0915    PT Time Calculation (min) 45 min    Activity Tolerance Patient tolerated treatment well    Behavior During Therapy WFL for tasks assessed/performed             Past Medical History:  Diagnosis Date   Anxiety    Arthritis    Elevated hemoglobin A1c 2019   5.7   Family history of DES exposure    Fibrocystic breast    GERD (gastroesophageal reflux disease)    HPV (human papilloma virus) infection    Hypercholesterolemia    OCD (obsessive compulsive disorder)    Pre-diabetes    S/P excision of lipoma    Past Surgical History:  Procedure Laterality Date   ANKLE SURGERY     broken as a child   BREAST BIOPSY Right    COLONOSCOPY     DILATION AND CURETTAGE OF UTERUS     LIPOMA EXCISION     rib cage   TOTAL KNEE ARTHROPLASTY Left 02/02/2023   Procedure: LEFT TOTAL KNEE ARTHROPLASTY;  Surgeon: Tarry Kos, MD;  Location: [facility] OR;  Service: Orthopedics;  Laterality: Left;   TRIGGER FINGER RELEASE Left 03/2013   thumb   Patient Active Problem List   Diagnosis Date Noted   Status post total left knee replacement 02/02/2023   Primary osteoarthritis of left knee 02/01/2023   Family history of breast cancer in mother 12/12/2013   H/O diethylstilbestrol (DES) exposure in utero 12/12/2013    PCP:  Thana Ates, MD       REFERRING PROVIDER:  Tarry Kos, MD     REFERRING DIAG:  [id number] (ICD-10-CM) - Primary osteoarthritis of left knee      THERAPY DIAG:  Muscle weakness (generalized)  Acute pain of left knee  Difficulty walking  Rationale for Evaluation and Treatment: Rehabilitation  ONSET  DATE: 02/02/23 DOS Manipulation 8/29    Days since surgery: 53   Operative procedure: Left total knee arthroplasty.   SUBJECTIVE:   SUBJECTIVE STATEMENT: Pt states that she has been actively performing her HEP with the exception of quad sets due to increased tension and pain noted. She has been rolling her quad with improvements noted today.   PERTINENT HISTORY: N/A PAIN:  Are you having pain? No NPRS scale: 0/10 Pain location: posterior knee anterior knee  Pain description: swelling/full Aggravating factors: bending Relieving factors: N/A, ice/rest   PRECAUTIONS: Knee  RED FLAGS: None   WEIGHT BEARING RESTRICTIONS: No  FALLS:  Has patient fallen in last 6 months? No  LIVING ENVIRONMENT: Lives with: lives with their family and lives with their spouse Lives in: House/apartment Stairs: Yes Has following equipment at home: Single point cane  OCCUPATION: Marketing executive; Office manager  PLOF: Independent  PATIENT GOALS: return to normal exercise    OBJECTIVE:   DIAGNOSTIC FINDINGS: N/A  PATIENT SURVEYS:  FOTO 39 60 @ DC 12 MCII   COGNITION: Overall cognitive status: Within functional limits for tasks assessed     SENSATION: WFL  EDEMA:  Moderate, palpable edema anteriorly and posteriorly  into popliteal fossa  POSTURE: No Significant postural limitations  PALPATION: TTP of medial and lateral knee; popliteal space; calf supple  LOWER EXTREMITY ROM:  Active ROM Right eval Left eval L 7/20 L 7/24 Lt 8/5 Lt  8/5 8/19 8/30  Hip flexion          Hip extension          Hip abduction          Hip adduction          Hip internal rotation          Hip external rotation          Knee flexion 125 75 78 86 95 91 101 CKC  107  Knee extension 3 -12 -5   1 0 0  Ankle dorsiflexion          Ankle plantarflexion          Ankle inversion          Ankle eversion           (Blank rows = not tested)   TODAY'S TREATMENT:  08/31 Manual: Manual: PROM  into flexion with OP; Grade II and III PA and AP mobilization; Short sitting PROM into flexion with contract/ relax at end range with 5 sec hold. STM to medial and lateral quad; reviewed all techniques with husband per request to be performed this weekend.   LAQ with blue theraband resistance  Squats to chair with cues to squeeze glutes  Quad sets   8/30 Manual: PROM into flexion; Grade II and III PA and AP mobilization; STM to medial and lateral quad; Edema massage   Reviewed self stretch for home using strap 5x 5 sec hold    Treatment                            8/28:  See post op HEP   Treatment                            8/26:  Bike 6 min half revolutions Discussion of manipulation and PT schedule/frequency STM and IASTM to tib anterior and distal lateral HS PROM L knee LAQ 3# 5" hold x20   Treatment                            8/22: Pt seen for aquatic therapy today.  Treatment took place in water 3.5-4.75 ft in depth at the [facility] pool. Temp of water was 91.  Pt entered/exited the pool via stairs using step to pattern with hand rail rail.  *walking forward, back with cues for heel strike *quad and hip flex stretch suing noodle *3 way stretch using solid noodle: hamstring, gastroc, adductor and IT band *STS from 3rd then 4th step from bottom Noodle kick down yellow hip in neutral position then externally rotation 10 slow then 10 fast ea position *cycling on noodle x 6 widths; hip add/abd; hip flex/ext *Quad stretch on 2nd step 5 pulses then static hold x 5 *Step up onto 2nd step cues for eccentric lowering and controlled left knee extension     Pt requires the buoyancy and hydrostatic pressure of water for support, and to offload joints by unweighting joint load by at least 50 % in navel deep water and by at least 75-80% in chest to neck deep water.  Viscosity of the water is needed for resistance of strengthening. Water current perturbations provides  challenge to standing balance requiring increased core activation.      PATIENT EDUCATION:  Education details:  anatomy, exercise progression, DOMS expectations, muscle firing,  envelope of function, HEP, POC  Person educated: Patient Education method: Explanation, Demonstration, Tactile cues, Verbal cues, and Handouts Education comprehension: verbalized understanding, returned demonstration, verbal cues required, tactile cues required, and needs further education     HOME EXERCISE PROGRAM:  Access Code: [email or web address] URL: [email or web address] Post op program   ASSESSMENT:   CLINICAL IMPRESSION: The patient tolerated treatment well. She continues to show improvements with her PROM reaching 109 today in supine. She  continues to note some tightness surrounding her knee. Encouraged use of heat, ice and soaking in warm bath with epsom salt. Emphasis placed on continued focus on flexion. Husband was present and requested to be taught all manual techniques to help assist during long weekend. He demonstrates understanding and good technique. Plan to continue with mobility while increasing strengthening exercises as tolerated. Discussed use of  cupping and DN to help minimize muscle tension noted.   OBJECTIVE IMPAIRMENTS decreased ROM, decreased strength, hypomobility, increased fascial restrictions, increased muscle spasms, impaired flexibility, improper body mechanics, postural dysfunction, and pain.    ACTIVITY LIMITATIONS carrying, lifting, sitting, stairs, locomotion level   PARTICIPATION LIMITATIONS: driving, shopping, community activity, occupation, and yard work   PERSONAL FACTORS Age, Time since onset of injury/illness/exacerbation, and 1 comorbidity:    are also affecting patient's functional outcome.        GOALS:   SHORT TERM GOALS: Target date: 04/02/2023     Pt will become independent with HEP in order to demonstrate synthesis of PT education.  Goal  status: met   2.  Pt will be able to demonstrate at least 110 deg knee flexion  in order to demonstrate functional improvement in LE function for self-care and house hold duties.    Goal status: ongoing   3.  Pt will score at least 12 pt increase on FOTO to demonstrate functional improvement in MCII and pt perceived function.     Goal status: INITIAL       LONG TERM GOALS: Target date: 05/20/2023     Pt  will become independent with final HEP in order to demonstrate synthesis of PT education.    Goal status: ongoing   2.  Pt will be able to demonstrate/report ability to walk >30 mins without pain in order to demonstrate functional improvement and tolerance to exercise and community mobility.    Goal status: met   3.  Pt will be able to demonstrate full depth squat in order to demonstrate functional improvement in LE function for self-care and house hold duties.  ongoing  4.  Pt will score >/= 60 on FOTO to demonstrate improvement in perceived L knee function.  Baseline:  Goal status: ongoing  5.  Pt will be able to demonstrate full L knee ROM in order to demonstrate functional improvement in LE function for self-care and house hold duties.  ongoing      PLAN: PT FREQUENCY: 1-2x/week Additional 3rd visit in the week of 8/19-8/24/24 for aquatics appt 6/week for 2 weeks &2-3/week following   PT DURATION: POC date   PLANNED INTERVENTIONS: Therapeutic exercises, Therapeutic activity, Neuromuscular re-education, Balance training, Gait training, Patient/Family education, Self Care, Joint mobilization, Joint manipulation, Orthotic/Fit training, Aquatic Therapy, Dry Needling, Electrical stimulation, Spinal manipulation, Spinal mobilization, Cryotherapy, Moist heat,  Compression bandaging, scar mobilization, Splintting, Taping, Vasopneumatic device, Traction, Ultrasound, Ionotophoresis 4mg /ml Dexamethasone, Manual therapy, and Re-evaluation   PLAN FOR NEXT SESSION: PROM into  flexion, work back into there-ex as tolerated. Cupping/ DN  Royal Hawthorn PT, DPT 04/04/23  9:55 AM

## 2023-04-07 ENCOUNTER — Encounter (HOSPITAL_BASED_OUTPATIENT_CLINIC_OR_DEPARTMENT_OTHER): Payer: Self-pay

## 2023-04-07 ENCOUNTER — Ambulatory Visit (HOSPITAL_BASED_OUTPATIENT_CLINIC_OR_DEPARTMENT_OTHER): Payer: HMO | Attending: Orthopaedic Surgery

## 2023-04-07 DIAGNOSIS — M6281 Muscle weakness (generalized): Secondary | ICD-10-CM | POA: Diagnosis not present

## 2023-04-07 DIAGNOSIS — R262 Difficulty in walking, not elsewhere classified: Secondary | ICD-10-CM | POA: Diagnosis not present

## 2023-04-07 DIAGNOSIS — M25562 Pain in left knee: Secondary | ICD-10-CM | POA: Insufficient documentation

## 2023-04-07 NOTE — Therapy (Signed)
OUTPATIENT PHYSICAL THERAPY LOWER EXTREMITY TREATMENT   Patient Name: Ashley Blair MRN: [id number] DOB:Jun 12, 1956, 67 y.o., female Today's Date: 04/07/2023  END OF SESSION:  PT End of Session - 04/07/23 1000     Visit Number 3    Number of Visits 16    Date for PT Re-Evaluation 05/29/23    Authorization Type HTA    PT Start Time 0848    PT Stop Time 0936    PT Time Calculation (min) 48 min    Activity Tolerance Patient tolerated treatment well    Behavior During Therapy WFL for tasks assessed/performed              Past Medical History:  Diagnosis Date   Anxiety    Arthritis    Elevated hemoglobin A1c 2019   5.7   Family history of DES exposure    Fibrocystic breast    GERD (gastroesophageal reflux disease)    HPV (human papilloma virus) infection    Hypercholesterolemia    OCD (obsessive compulsive disorder)    Pre-diabetes    S/P excision of lipoma    Past Surgical History:  Procedure Laterality Date   ANKLE SURGERY     broken as a child   BREAST BIOPSY Right    COLONOSCOPY     DILATION AND CURETTAGE OF UTERUS     LIPOMA EXCISION     rib cage   TOTAL KNEE ARTHROPLASTY Left 02/02/2023   Procedure: LEFT TOTAL KNEE ARTHROPLASTY;  Surgeon: Tarry Kos, MD;  Location: [facility] OR;  Service: Orthopedics;  Laterality: Left;   TRIGGER FINGER RELEASE Left 03/2013   thumb   Patient Active Problem List   Diagnosis Date Noted   Status post total left knee replacement 02/02/2023   Primary osteoarthritis of left knee 02/01/2023   Family history of breast cancer in mother 12/12/2013   H/O diethylstilbestrol (DES) exposure in utero 12/12/2013    PCP:  Thana Ates, MD       REFERRING PROVIDER:  Tarry Kos, MD     REFERRING DIAG:  [phone] (ICD-10-CM) - Primary osteoarthritis of left knee      THERAPY DIAG:  Difficulty walking  Acute pain of left knee  Muscle weakness (generalized)  Rationale for Evaluation and Treatment: Rehabilitation  ONSET  DATE: 02/02/23 DOS Manipulation 8/29    Days since surgery: 53   Operative procedure: Left total knee arthroplasty.   SUBJECTIVE:   SUBJECTIVE STATEMENT: Pt reports she has been working on PNF stretching at home along with other ROM exercises. "It's hard to tell if I'm making progress." Pt reports swelling has been fluctuating, which is worse at night. Increased knee stiffness in the mornings. Pt compliant with HEP and husband assists with PROM.  PERTINENT HISTORY: N/A PAIN:  Are you having pain? No NPRS scale: 5-6/10 Pain location: posterior knee anterior knee  Pain description: "tight band feeling" Aggravating factors: bending Relieving factors: N/A, ice/rest   PRECAUTIONS: Knee  RED FLAGS: None   WEIGHT BEARING RESTRICTIONS: No  FALLS:  Has patient fallen in last 6 months? No  LIVING ENVIRONMENT: Lives with: lives with their family and lives with their spouse Lives in: House/apartment Stairs: Yes Has following equipment at home: Single point cane  OCCUPATION: Marketing executive; Office manager  PLOF: Independent  PATIENT GOALS: return to normal exercise    OBJECTIVE:   DIAGNOSTIC FINDINGS: N/A  PATIENT SURVEYS:  FOTO 39 60 @ DC 12 MCII   COGNITION: Overall cognitive status: Within functional  limits for tasks assessed     SENSATION: WFL  EDEMA:  Moderate, palpable edema anteriorly and posteriorly into popliteal fossa  POSTURE: No Significant postural limitations  PALPATION: TTP of medial and lateral knee; popliteal space; calf supple  LOWER EXTREMITY ROM:  Active ROM Right eval Left eval L 7/20 L 7/24 Lt 8/5 Lt  8/5 8/19 8/30 9/3  Hip flexion           Hip extension           Hip abduction           Hip adduction           Hip internal rotation           Hip external rotation           Knee flexion 125 75 78 86 95 91 101 CKC  107 105  Knee extension 3 -12 -5   1 0 0   Ankle dorsiflexion           Ankle plantarflexion            Ankle inversion           Ankle eversion            (Blank rows = not tested)   TODAY'S TREATMENT:    9/3: PROM L knee seated and supine with overpressure.  STM to medial and lateral quads, posterior knee Patella mobilizations tib/fem mobilizations  Sci-fit bike 5 min full revs Prone knee flexion x10 Stair climbing 1/2 flight reciprocally up/down- single rail up, bil rail down   08/31 Manual: Manual: PROM into flexion with OP; Grade II and III PA and AP mobilization; Short sitting PROM into flexion with contract/ relax at end range with 5 sec hold. STM to medial and lateral quad; reviewed all techniques with husband per request to be performed this weekend.   LAQ with blue theraband resistance  Squats to chair with cues to squeeze glutes  Quad sets   8/30 Manual: PROM into flexion; Grade II and III PA and AP mobilization; STM to medial and lateral quad; Edema massage   Reviewed self stretch for home using strap 5x 5 sec hold    Treatment                            8/28:  See post op HEP   Treatment                            8/26:  Bike 6 min half revolutions Discussion of manipulation and PT schedule/frequency STM and IASTM to tib anterior and distal lateral HS PROM L knee LAQ 3# 5" hold x20   Treatment                            8/22: Pt seen for aquatic therapy today.  Treatment took place in water 3.5-4.75 ft in depth at the [facility] pool. Temp of water was 91.  Pt entered/exited the pool via stairs using step to pattern with hand rail rail.  *walking forward, back with cues for heel strike *quad and hip flex stretch suing noodle *3 way stretch using solid noodle: hamstring, gastroc, adductor and IT band *STS from 3rd then 4th step from bottom Noodle kick down yellow hip in neutral position then externally rotation 10 slow then 10 fast ea  position *cycling on noodle x 6 widths; hip add/abd; hip flex/ext *Quad stretch on 2nd step 5 pulses then  static hold x 5 *Step up onto 2nd step cues for eccentric lowering and controlled left knee extension     Pt requires the buoyancy and hydrostatic pressure of water for support, and to offload joints by unweighting joint load by at least 50 % in navel deep water and by at least 75-80% in chest to neck deep water.  Viscosity of the water is needed for resistance of strengthening. Water current perturbations provides challenge to standing balance requiring increased core activation.      PATIENT EDUCATION:  Education details:  anatomy, exercise progression, DOMS expectations, muscle firing,  envelope of function, HEP, POC  Person educated: Patient Education method: Explanation, Demonstration, Tactile cues, Verbal cues, and Handouts Education comprehension: verbalized understanding, returned demonstration, verbal cues required, tactile cues required, and needs further education     HOME EXERCISE PROGRAM:  Access Code: [email or web address] URL: [email or web address] Post op program   ASSESSMENT:   CLINICAL IMPRESSION: Measured at 105 deg passive knee flexion today. Focused on aggressive PROM and STM to relieve tightness in medial/latera/posterior knee musculature. Available knee extension is similar to contralateral LE, with minimal visual difference. She was instructed to continue to work on knee extension stretching for maintenance. She was able to demonstrate stair climbing in clinic with rail assist, which she has difficulty with at home. Will continue with ROM interventions to improve functional movement following recent MUA.   OBJECTIVE IMPAIRMENTS decreased ROM, decreased strength, hypomobility, increased fascial restrictions, increased muscle spasms, impaired flexibility, improper body mechanics, postural dysfunction, and pain.    ACTIVITY LIMITATIONS carrying, lifting, sitting, stairs, locomotion level   PARTICIPATION LIMITATIONS: driving, shopping, community  activity, occupation, and yard work   PERSONAL FACTORS Age, Time since onset of injury/illness/exacerbation, and 1 comorbidity:    are also affecting patient's functional outcome.        GOALS:   SHORT TERM GOALS: Target date: 04/02/2023     Pt will become independent with HEP in order to demonstrate synthesis of PT education.  Goal status: met   2.  Pt will be able to demonstrate at least 110 deg knee flexion  in order to demonstrate functional improvement in LE function for self-care and house hold duties.    Goal status: ongoing   3.  Pt will score at least 12 pt increase on FOTO to demonstrate functional improvement in MCII and pt perceived function.     Goal status: INITIAL       LONG TERM GOALS: Target date: 05/20/2023     Pt  will become independent with final HEP in order to demonstrate synthesis of PT education.    Goal status: ongoing   2.  Pt will be able to demonstrate/report ability to walk >30 mins without pain in order to demonstrate functional improvement and tolerance to exercise and community mobility.    Goal status: met   3.  Pt will be able to demonstrate full depth squat in order to demonstrate functional improvement in LE function for self-care and house hold duties.  ongoing  4.  Pt will score >/= 60 on FOTO to demonstrate improvement in perceived L knee function.  Baseline:  Goal status: ongoing  5.  Pt will be able to demonstrate full L knee ROM in order to demonstrate functional improvement in LE function for self-care and house hold duties.  ongoing      PLAN:  PT FREQUENCY: 1-2x/week Additional 3rd visit in the week of 8/19-8/24/24 for aquatics appt 6/week for 2 weeks &2-3/week following   PT DURATION: POC date   PLANNED INTERVENTIONS: Therapeutic exercises, Therapeutic activity, Neuromuscular re-education, Balance training, Gait training, Patient/Family education, Self Care, Joint mobilization, Joint manipulation, Orthotic/Fit  training, Aquatic Therapy, Dry Needling, Electrical stimulation, Spinal manipulation, Spinal mobilization, Cryotherapy, Moist heat, Compression bandaging, scar mobilization, Splintting, Taping, Vasopneumatic device, Traction, Ultrasound, Ionotophoresis 4mg /ml Dexamethasone, Manual therapy, and Re-evaluation   PLAN FOR NEXT SESSION: PROM into flexion, work back into there-ex as tolerated. Cupping/ DN  Riki Altes, PTA  04/07/23  10:09 AM

## 2023-04-08 ENCOUNTER — Ambulatory Visit (HOSPITAL_BASED_OUTPATIENT_CLINIC_OR_DEPARTMENT_OTHER): Payer: HMO | Admitting: Physical Therapy

## 2023-04-08 ENCOUNTER — Encounter (HOSPITAL_BASED_OUTPATIENT_CLINIC_OR_DEPARTMENT_OTHER): Payer: Self-pay | Admitting: Physical Therapy

## 2023-04-08 DIAGNOSIS — R262 Difficulty in walking, not elsewhere classified: Secondary | ICD-10-CM

## 2023-04-08 DIAGNOSIS — M25562 Pain in left knee: Secondary | ICD-10-CM

## 2023-04-08 DIAGNOSIS — M6281 Muscle weakness (generalized): Secondary | ICD-10-CM

## 2023-04-08 NOTE — Therapy (Signed)
OUTPATIENT PHYSICAL THERAPY LOWER EXTREMITY TREATMENT   Patient Name: Ashley Blair MRN: [id number] DOB:Nov 08, 1955, 67 y.o., female Today's Date: 04/08/2023  END OF SESSION:  PT End of Session - 04/08/23 0801     Visit Number 4    Number of Visits 16    Date for PT Re-Evaluation 05/29/23    Authorization Type HTA    PT Start Time 0801    PT Stop Time 0845    PT Time Calculation (min) 44 min    Activity Tolerance Patient tolerated treatment well    Behavior During Therapy WFL for tasks assessed/performed              Past Medical History:  Diagnosis Date   Anxiety    Arthritis    Elevated hemoglobin A1c 2019   5.7   Family history of DES exposure    Fibrocystic breast    GERD (gastroesophageal reflux disease)    HPV (human papilloma virus) infection    Hypercholesterolemia    OCD (obsessive compulsive disorder)    Pre-diabetes    S/P excision of lipoma    Past Surgical History:  Procedure Laterality Date   ANKLE SURGERY     broken as a child   BREAST BIOPSY Right    COLONOSCOPY     DILATION AND CURETTAGE OF UTERUS     LIPOMA EXCISION     rib cage   TOTAL KNEE ARTHROPLASTY Left 02/02/2023   Procedure: LEFT TOTAL KNEE ARTHROPLASTY;  Surgeon: Tarry Kos, MD;  Location: [facility] OR;  Service: Orthopedics;  Laterality: Left;   TRIGGER FINGER RELEASE Left 03/2013   thumb   Patient Active Problem List   Diagnosis Date Noted   Status post total left knee replacement 02/02/2023   Primary osteoarthritis of left knee 02/01/2023   Family history of breast cancer in mother 12/12/2013   H/O diethylstilbestrol (DES) exposure in utero 12/12/2013    PCP:  Thana Ates, MD       REFERRING PROVIDER:  Tarry Kos, MD     REFERRING DIAG:  [phone] (ICD-10-CM) - Primary osteoarthritis of left knee      THERAPY DIAG:  Difficulty walking  Acute pain of left knee  Muscle weakness (generalized)  Rationale for Evaluation and Treatment: Rehabilitation  ONSET  DATE: 02/02/23 DOS Manipulation 8/29    Days since surgery: 53   Operative procedure: Left total knee arthroplasty.   SUBJECTIVE:   SUBJECTIVE STATEMENT: Pt reports knee is alright. HEP going well.   PERTINENT HISTORY: N/A PAIN:  Are you having pain? No NPRS scale: 2/10 Pain location: posterior knee anterior knee  Pain description: "tight band feeling" Aggravating factors: bending Relieving factors: N/A, ice/rest   PRECAUTIONS: Knee  RED FLAGS: None   WEIGHT BEARING RESTRICTIONS: No  FALLS:  Has patient fallen in last 6 months? No  LIVING ENVIRONMENT: Lives with: lives with their family and lives with their spouse Lives in: House/apartment Stairs: Yes Has following equipment at home: Single point cane  OCCUPATION: Marketing executive; Office manager  PLOF: Independent  PATIENT GOALS: return to normal exercise    OBJECTIVE:   DIAGNOSTIC FINDINGS: N/A  PATIENT SURVEYS:  FOTO 39 60 @ DC 12 MCII   COGNITION: Overall cognitive status: Within functional limits for tasks assessed     SENSATION: WFL  EDEMA:  Moderate, palpable edema anteriorly and posteriorly into popliteal fossa  POSTURE: No Significant postural limitations  PALPATION: TTP of medial and lateral knee; popliteal space; calf supple  LOWER EXTREMITY ROM:  Active ROM Right eval Left eval L 7/20 L 7/24 Lt 8/5 Lt  8/5 8/19 8/30 9/3 04/08/23  Hip flexion            Hip extension            Hip abduction            Hip adduction            Hip internal rotation            Hip external rotation            Knee flexion 125 75 78 86 95 91 101 CKC  107 105 90 at beginning of session, improves to 110  Knee extension 3 -12 -5   1 0 0    Ankle dorsiflexion            Ankle plantarflexion            Ankle inversion            Ankle eversion             (Blank rows = not tested)   TODAY'S TREATMENT:  04/08/23 Supine heel slides with strap 10 x 10 second holds improves from 90 to  99 Supine hamstring isometrics into green ball 5 x 10 second holds Contract relax in seated 10 x 5 second holds improves to 107 Contract relax in prone 2x 10 x 5 second holds improves to 110 Seated on stair progressive flexion stretch 5 minutes Shuttle 75, 4 x 10 with progressive knee flexion 110 following    9/3: PROM L knee seated and supine with overpressure.  STM to medial and lateral quads, posterior knee Patella mobilizations tib/fem mobilizations  Sci-fit bike 5 min full revs Prone knee flexion x10 Stair climbing 1/2 flight reciprocally up/down- single rail up, bil rail down   08/31 Manual: Manual: PROM into flexion with OP; Grade II and III PA and AP mobilization; Short sitting PROM into flexion with contract/ relax at end range with 5 sec hold. STM to medial and lateral quad; reviewed all techniques with husband per request to be performed this weekend.   LAQ with blue theraband resistance  Squats to chair with cues to squeeze glutes  Quad sets   8/30 Manual: PROM into flexion; Grade II and III PA and AP mobilization; STM to medial and lateral quad; Edema massage   Reviewed self stretch for home using strap 5x 5 sec hold    Treatment                            8/28:  See post op HEP   Treatment                            8/26:  Bike 6 min half revolutions Discussion of manipulation and PT schedule/frequency STM and IASTM to tib anterior and distal lateral HS PROM L knee LAQ 3# 5" hold x20   Treatment                            8/22: Pt seen for aquatic therapy today.  Treatment took place in water 3.5-4.75 ft in depth at the [facility] pool. Temp of water was 91.  Pt entered/exited the pool via stairs using step to pattern with hand rail rail.  *walking  forward, back with cues for heel strike *quad and hip flex stretch suing noodle *3 way stretch using solid noodle: hamstring, gastroc, adductor and IT band *STS from 3rd then 4th step from  bottom Noodle kick down yellow hip in neutral position then externally rotation 10 slow then 10 fast ea position *cycling on noodle x 6 widths; hip add/abd; hip flex/ext *Quad stretch on 2nd step 5 pulses then static hold x 5 *Step up onto 2nd step cues for eccentric lowering and controlled left knee extension     Pt requires the buoyancy and hydrostatic pressure of water for support, and to offload joints by unweighting joint load by at least 50 % in navel deep water and by at least 75-80% in chest to neck deep water.  Viscosity of the water is needed for resistance of strengthening. Water current perturbations provides challenge to standing balance requiring increased core activation.      PATIENT EDUCATION:  Education details:  anatomy, exercise progression, DOMS expectations, muscle firing,  envelope of function, HEP, POC  Person educated: Patient Education method: Explanation, Demonstration, Tactile cues, Verbal cues, and Handouts Education comprehension: verbalized understanding, returned demonstration, verbal cues required, tactile cues required, and needs further education     HOME EXERCISE PROGRAM:  Access Code: [email or web address] URL: [email or web address] Post op program   ASSESSMENT:   CLINICAL IMPRESSION: Patient with L knee ROM from 0 to 90 at beginning of session. ROM improves to 110 following manual techniques. Patient is educated on and performs additional flexion stretches and continued quad activation with progressive flexion on shuttle. Husband educated on techniques for home performance. Patient will continue to benefit from physical therapy in order to improve function and reduce impairment.   OBJECTIVE IMPAIRMENTS decreased ROM, decreased strength, hypomobility, increased fascial restrictions, increased muscle spasms, impaired flexibility, improper body mechanics, postural dysfunction, and pain.    ACTIVITY LIMITATIONS carrying, lifting,  sitting, stairs, locomotion level   PARTICIPATION LIMITATIONS: driving, shopping, community activity, occupation, and yard work   PERSONAL FACTORS Age, Time since onset of injury/illness/exacerbation, and 1 comorbidity:    are also affecting patient's functional outcome.        GOALS:   SHORT TERM GOALS: Target date: 04/02/2023     Pt will become independent with HEP in order to demonstrate synthesis of PT education.  Goal status: met   2.  Pt will be able to demonstrate at least 110 deg knee flexion  in order to demonstrate functional improvement in LE function for self-care and house hold duties.    Goal status: ongoing   3.  Pt will score at least 12 pt increase on FOTO to demonstrate functional improvement in MCII and pt perceived function.     Goal status: INITIAL       LONG TERM GOALS: Target date: 05/20/2023     Pt  will become independent with final HEP in order to demonstrate synthesis of PT education.    Goal status: ongoing   2.  Pt will be able to demonstrate/report ability to walk >30 mins without pain in order to demonstrate functional improvement and tolerance to exercise and community mobility.    Goal status: met   3.  Pt will be able to demonstrate full depth squat in order to demonstrate functional improvement in LE function for self-care and house hold duties.  ongoing  4.  Pt will score >/= 60 on FOTO to demonstrate improvement in perceived L knee function.  Baseline:  Goal status: ongoing  5.  Pt will be able to demonstrate full L knee ROM in order to demonstrate functional improvement in LE function for self-care and house hold duties.  ongoing      PLAN: PT FREQUENCY: 1-2x/week Additional 3rd visit in the week of 8/19-8/24/24 for aquatics appt 6/week for 2 weeks &2-3/week following   PT DURATION: POC date   PLANNED INTERVENTIONS: Therapeutic exercises, Therapeutic activity, Neuromuscular re-education, Balance training, Gait training,  Patient/Family education, Self Care, Joint mobilization, Joint manipulation, Orthotic/Fit training, Aquatic Therapy, Dry Needling, Electrical stimulation, Spinal manipulation, Spinal mobilization, Cryotherapy, Moist heat, Compression bandaging, scar mobilization, Splintting, Taping, Vasopneumatic device, Traction, Ultrasound, Ionotophoresis 4mg /ml Dexamethasone, Manual therapy, and Re-evaluation   PLAN FOR NEXT SESSION: PROM into flexion, work back into there-ex as tolerated. Cupping/ DN  8:49 AM, 04/08/23 Wyman Songster PT, DPT Physical Therapist at [facility]

## 2023-04-09 ENCOUNTER — Encounter: Payer: Self-pay | Admitting: Orthopaedic Surgery

## 2023-04-09 ENCOUNTER — Ambulatory Visit (INDEPENDENT_AMBULATORY_CARE_PROVIDER_SITE_OTHER): Payer: HMO | Admitting: Orthopaedic Surgery

## 2023-04-09 ENCOUNTER — Encounter (HOSPITAL_BASED_OUTPATIENT_CLINIC_OR_DEPARTMENT_OTHER): Payer: Self-pay | Admitting: Physical Therapy

## 2023-04-09 ENCOUNTER — Ambulatory Visit (HOSPITAL_BASED_OUTPATIENT_CLINIC_OR_DEPARTMENT_OTHER): Payer: HMO | Admitting: Physical Therapy

## 2023-04-09 DIAGNOSIS — M25562 Pain in left knee: Secondary | ICD-10-CM

## 2023-04-09 DIAGNOSIS — R262 Difficulty in walking, not elsewhere classified: Secondary | ICD-10-CM | POA: Diagnosis not present

## 2023-04-09 DIAGNOSIS — M6281 Muscle weakness (generalized): Secondary | ICD-10-CM

## 2023-04-09 DIAGNOSIS — Z96652 Presence of left artificial knee joint: Secondary | ICD-10-CM

## 2023-04-09 NOTE — Progress Notes (Signed)
   Post-Op Visit Note   Patient: Ashley Blair           Date of Birth: [birth year]           MRN: [id number] Visit Date: 04/09/2023 PCP: Thana Ates, MD   Assessment & Plan:  Chief Complaint:  Chief Complaint  Patient presents with   Left Knee - Follow-up    Manipulation 04/02/2023   Visit Diagnoses:  1. Status post total left knee replacement     Plan: Ashley Blair is 1 week status post manipulation of the left knee.  She is going to physical therapy 6 times a week.  She is making appropriate progress in range of motion.  Examination left knee is benign.  Physical therapy reports show that she is able to achieve 110 degrees of flexion.  Collaterals are stable.  From my standpoint I feel that Ashley Blair is improving appropriately.  She is working really hard towards achieving 120 degrees.  She is very motivated.  Like to recheck her range of motion 4 weeks.  Needs repeat x-rays of the left knee at that time.  Follow-Up Instructions: Return in about 4 weeks (around 05/07/2023).   Orders:  No orders of the defined types were placed in this encounter.  No orders of the defined types were placed in this encounter.   Imaging: No results found.  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 12/12/2013   Past Medical History:  Diagnosis Date   Anxiety    Arthritis    Elevated hemoglobin A1c 2019   5.7   Family history of DES exposure    Fibrocystic breast    GERD (gastroesophageal reflux disease)    HPV (human papilloma virus) infection    Hypercholesterolemia    OCD (obsessive compulsive disorder)    Pre-diabetes    S/P excision of lipoma     Family History  Problem Relation Age of Onset   Breast cancer Mother    Osteoporosis Maternal Grandmother     Stroke Maternal Grandfather     Past Surgical History:  Procedure Laterality Date   ANKLE SURGERY     broken as a child   BREAST BIOPSY Right    COLONOSCOPY     DILATION AND CURETTAGE OF UTERUS     LIPOMA EXCISION     rib cage   TOTAL KNEE ARTHROPLASTY Left 02/02/2023   Procedure: LEFT TOTAL KNEE ARTHROPLASTY;  Surgeon: Tarry Kos, MD;  Location: [facility] OR;  Service: Orthopedics;  Laterality: Left;   TRIGGER FINGER RELEASE Left 03/2013   thumb   Social History   Occupational History   Not on file  Tobacco Use   Smoking status: Former    Current packs/day: 0.00    Types: Cigarettes    Quit date: 12/07/1975    Years since quitting: 47.3   Smokeless tobacco: Never  Vaping Use   Vaping status: Never Used  Substance and Sexual Activity   Alcohol use: Not Currently    Comment: rarely   Drug use: No   Sexual activity: Not Currently    Partners: Male    Birth control/protection: Post-menopausal

## 2023-04-09 NOTE — Therapy (Signed)
OUTPATIENT PHYSICAL THERAPY LOWER EXTREMITY TREATMENT   Patient Name: Ashley Blair MRN: [id number] DOB:11-06-1955, 67 y.o., female Today's Date: 04/09/2023  END OF SESSION:  PT End of Session - 04/09/23 1629     Visit Number 5    Number of Visits 16    Date for PT Re-Evaluation 05/29/23    Authorization Type HTA    PT Start Time 1601    PT Stop Time 1648    PT Time Calculation (min) 47 min    Activity Tolerance Patient tolerated treatment well    Behavior During Therapy WFL for tasks assessed/performed              Past Medical History:  Diagnosis Date   Anxiety    Arthritis    Elevated hemoglobin A1c 2019   5.7   Family history of DES exposure    Fibrocystic breast    GERD (gastroesophageal reflux disease)    HPV (human papilloma virus) infection    Hypercholesterolemia    OCD (obsessive compulsive disorder)    Pre-diabetes    S/P excision of lipoma    Past Surgical History:  Procedure Laterality Date   ANKLE SURGERY     broken as a child   BREAST BIOPSY Right    COLONOSCOPY     DILATION AND CURETTAGE OF UTERUS     LIPOMA EXCISION     rib cage   TOTAL KNEE ARTHROPLASTY Left 02/02/2023   Procedure: LEFT TOTAL KNEE ARTHROPLASTY;  Surgeon: Tarry Kos, MD;  Location: [facility] OR;  Service: Orthopedics;  Laterality: Left;   TRIGGER FINGER RELEASE Left 03/2013   thumb   Patient Active Problem List   Diagnosis Date Noted   Status post total left knee replacement 02/02/2023   Primary osteoarthritis of left knee 02/01/2023   Family history of breast cancer in mother 12/12/2013   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   SUBJECTIVE:   SUBJECTIVE STATEMENT: Pt reports knee is alright. HEP going well.   PERTINENT HISTORY: N/A PAIN:  Are you having pain? No NPRS scale: 2/10 Pain location: posterior knee anterior knee  Pain description: "tight band feeling" Aggravating factors: bending Relieving factors: N/A, ice/rest   PRECAUTIONS: Knee  RED FLAGS: None   WEIGHT BEARING RESTRICTIONS: No  FALLS:  Has patient fallen in last 6 months? No  LIVING ENVIRONMENT: Lives with: lives with their family and lives with their spouse Lives in: House/apartment Stairs: Yes Has following equipment at home: Single point cane  OCCUPATION: Marketing executive; Office manager  PLOF: Independent  PATIENT GOALS: return to normal exercise    OBJECTIVE:   DIAGNOSTIC FINDINGS: N/A  PATIENT SURVEYS:  FOTO 39 60 @ DC 12 MCII   COGNITION: Overall cognitive status: Within functional limits for tasks assessed     SENSATION: WFL  EDEMA:  Moderate, palpable edema anteriorly and posteriorly into popliteal fossa  POSTURE: No Significant postural limitations  PALPATION: TTP of medial and lateral knee; popliteal space; calf supple  LOWER EXTREMITY ROM:  Active ROM Right eval Left eval L 7/20 L 7/24 Lt 8/5 Lt  8/5 8/19 8/30 9/3 04/08/23  Hip flexion            Hip extension            Hip abduction            Hip adduction            Hip internal rotation            Hip external rotation            Knee flexion 125 75 78 86 95 91 101 CKC  107 105 90 at beginning of session, improves to 110  Knee extension 3 -12 -5   1 0 0    Ankle dorsiflexion            Ankle plantarflexion            Ankle inversion            Ankle eversion             (Blank rows = not tested)   TODAY'S TREATMENT:  Treatment                            9/5:  Trigger Point Dry Needling, Manual  Therapy Treatment:  Initial or subsequent education regarding Trigger Point Dry Needling: Subsequent Did patient give consent to treatment with Trigger Point Dry Needling: Yes TPDN with skilled palpation and monitoring followed by STM to the following muscles: biceps femoris & VL  STM & myofascial rolling along VL and across distal knee Reviewed seated stretch Lunge stretch with AP tibial mobs- edu husband on use of sheet at home Deep squat at sink for pulloff.   04/08/23 Supine heel slides with strap 10 x 10 second holds improves from 90 to 99 Supine hamstring isometrics into green ball 5 x 10 second holds Contract relax in seated 10 x 5 second holds improves to 107 Contract relax in prone 2x 10 x 5 second holds improves to 110 Seated on stair progressive flexion stretch 5 minutes Shuttle 75, 4 x 10 with progressive knee flexion 110 following    9/3: PROM L knee seated and supine with overpressure.  STM to medial and lateral quads, posterior knee Patella mobilizations tib/fem mobilizations  Sci-fit bike 5 min full revs Prone knee flexion x10 Stair climbing 1/2 flight reciprocally up/down- single rail up, bil rail down   08/31 Manual: Manual: PROM into flexion with OP; Grade II and III PA and AP mobilization; Short sitting PROM into flexion with contract/ relax at end range with 5 sec hold. STM to medial and lateral quad; reviewed all techniques with husband per request to be performed this weekend.   LAQ with blue theraband resistance  Squats to chair with cues to squeeze glutes  Quad sets   8/30 Manual: PROM into flexion; Grade II and III PA and AP mobilization; STM to medial and lateral quad; Edema massage   Reviewed self stretch for home using strap 5x 5 sec hold    Treatment                            8/28:  See post op HEP   Treatment  8/26:  Bike 6 min half revolutions Discussion of manipulation and PT schedule/frequency STM and IASTM  to tib anterior and distal lateral HS PROM L knee LAQ 3# 5" hold x20   Treatment                            8/22: Pt seen for aquatic therapy today.  Treatment took place in water 3.5-4.75 ft in depth at the [facility] pool. Temp of water was 91.  Pt entered/exited the pool via stairs using step to pattern with hand rail rail.  *walking forward, back with cues for heel strike *quad and hip flex stretch suing noodle *3 way stretch using solid noodle: hamstring, gastroc, adductor and IT band *STS from 3rd then 4th step from bottom Noodle kick down yellow hip in neutral position then externally rotation 10 slow then 10 fast ea position *cycling on noodle x 6 widths; hip add/abd; hip flex/ext *Quad stretch on 2nd step 5 pulses then static hold x 5 *Step up onto 2nd step cues for eccentric lowering and controlled left knee extension     Pt requires the buoyancy and hydrostatic pressure of water for support, and to offload joints by unweighting joint load by at least 50 % in navel deep water and by at least 75-80% in chest to neck deep water.  Viscosity of the water is needed for resistance of strengthening. Water current perturbations provides challenge to standing balance requiring increased core activation.      PATIENT EDUCATION:  Education details:  anatomy, exercise progression, DOMS expectations, muscle firing,  envelope of function, HEP, POC  Person educated: Patient Education method: Explanation, Demonstration, Tactile cues, Verbal cues, and Handouts Education comprehension: verbalized understanding, returned demonstration, verbal cues required, tactile cues required, and needs further education     HOME EXERCISE PROGRAM:  Access Code: [email or web address] URL: [email or web address] Post op program   ASSESSMENT:   CLINICAL IMPRESSION: Able to achieve 114 deg today. Reports medial knee pain is what is stopping her- discussed using massage gun at add  muscle group but not into medial knee.   OBJECTIVE IMPAIRMENTS decreased ROM, decreased strength, hypomobility, increased fascial restrictions, increased muscle spasms, impaired flexibility, improper body mechanics, postural dysfunction, and pain.    ACTIVITY LIMITATIONS carrying, lifting, sitting, stairs, locomotion level   PARTICIPATION LIMITATIONS: driving, shopping, community activity, occupation, and yard work   PERSONAL FACTORS Age, Time since onset of injury/illness/exacerbation, and 1 comorbidity:    are also affecting patient's functional outcome.        GOALS:   SHORT TERM GOALS: Target date: 04/02/2023     Pt will become independent with HEP in order to demonstrate synthesis of PT education.  Goal status: met   2.  Pt will be able to demonstrate at least 110 deg knee flexion  in order to demonstrate functional improvement in LE function for self-care and house hold duties.    Goal status: ongoing   3.  Pt will score at least 12 pt increase on FOTO to demonstrate functional improvement in MCII and pt perceived function.     Goal status: INITIAL       LONG TERM GOALS: Target date: 05/20/2023     Pt  will become independent with final HEP in order to demonstrate synthesis of PT education.    Goal status: ongoing   2.  Pt will be able to demonstrate/report ability to walk >30 mins without pain in order  to demonstrate functional improvement and tolerance to exercise and community mobility.    Goal status: met   3.  Pt will be able to demonstrate full depth squat in order to demonstrate functional improvement in LE function for self-care and house hold duties.  ongoing  4.  Pt will score >/= 60 on FOTO to demonstrate improvement in perceived L knee function.  Baseline:  Goal status: ongoing  5.  Pt will be able to demonstrate full L knee ROM in order to demonstrate functional improvement in LE function for self-care and house hold  duties.  ongoing      PLAN: PT FREQUENCY: 1-2x/week Additional 3rd visit in the week of 8/19-8/24/24 for aquatics appt 6/week for 2 weeks &2-3/week following   PT DURATION: POC date   PLANNED INTERVENTIONS: Therapeutic exercises, Therapeutic activity, Neuromuscular re-education, Balance training, Gait training, Patient/Family education, Self Care, Joint mobilization, Joint manipulation, Orthotic/Fit training, Aquatic Therapy, Dry Needling, Electrical stimulation, Spinal manipulation, Spinal mobilization, Cryotherapy, Moist heat, Compression bandaging, scar mobilization, Splintting, Taping, Vasopneumatic device, Traction, Ultrasound, Ionotophoresis 4mg /ml Dexamethasone, Manual therapy, and Re-evaluation   PLAN FOR NEXT SESSION: PROM into flexion, work back into there-ex as tolerated. Cupping/ DN  Cyndie Woodbeck C. Kinta Martis PT, DPT 04/09/23 4:57 PM

## 2023-04-10 ENCOUNTER — Other Ambulatory Visit: Payer: Self-pay | Admitting: Orthopaedic Surgery

## 2023-04-10 ENCOUNTER — Encounter (HOSPITAL_BASED_OUTPATIENT_CLINIC_OR_DEPARTMENT_OTHER): Payer: Self-pay | Admitting: Physical Therapy

## 2023-04-10 ENCOUNTER — Ambulatory Visit (HOSPITAL_BASED_OUTPATIENT_CLINIC_OR_DEPARTMENT_OTHER): Payer: HMO | Admitting: Physical Therapy

## 2023-04-10 DIAGNOSIS — R262 Difficulty in walking, not elsewhere classified: Secondary | ICD-10-CM

## 2023-04-10 DIAGNOSIS — M6281 Muscle weakness (generalized): Secondary | ICD-10-CM

## 2023-04-10 DIAGNOSIS — M25562 Pain in left knee: Secondary | ICD-10-CM

## 2023-04-10 MED ORDER — AMOXICILLIN 500 MG PO CAPS: 2000.0000 mg | ORAL_CAPSULE | Freq: Once | ORAL | 6 refills | Status: DC

## 2023-04-10 NOTE — Therapy (Signed)
OUTPATIENT PHYSICAL THERAPY LOWER EXTREMITY TREATMENT   Patient Name: Niyana Masarik MRN: [id number] DOB:1956/03/21, 67 y.o., female Today's Date: 04/10/2023  END OF SESSION:  PT End of Session - 04/10/23 0932     Visit Number 6    Number of Visits 16    Date for PT Re-Evaluation 05/29/23    Authorization Type HTA    PT Start Time 0932    PT Stop Time 1015    PT Time Calculation (min) 43 min    Activity Tolerance Patient tolerated treatment well    Behavior During Therapy WFL for tasks assessed/performed              Past Medical History:  Diagnosis Date   Anxiety    Arthritis    Elevated hemoglobin A1c 2019   5.7   Family history of DES exposure    Fibrocystic breast    GERD (gastroesophageal reflux disease)    HPV (human papilloma virus) infection    Hypercholesterolemia    OCD (obsessive compulsive disorder)    Pre-diabetes    S/P excision of lipoma    Past Surgical History:  Procedure Laterality Date   ANKLE SURGERY     broken as a child   BREAST BIOPSY Right    COLONOSCOPY     DILATION AND CURETTAGE OF UTERUS     LIPOMA EXCISION     rib cage   TOTAL KNEE ARTHROPLASTY Left 02/02/2023   Procedure: LEFT TOTAL KNEE ARTHROPLASTY;  Surgeon: Tarry Kos, MD;  Location: [facility] OR;  Service: Orthopedics;  Laterality: Left;   TRIGGER FINGER RELEASE Left 03/2013   thumb   Patient Active Problem List   Diagnosis Date Noted   Status post total left knee replacement 02/02/2023   Primary osteoarthritis of left knee 02/01/2023   Family history of breast cancer in mother 12/12/2013   H/O diethylstilbestrol (DES) exposure in utero 12/12/2013    PCP:  Thana Ates, MD       REFERRING PROVIDER:  Tarry Kos, MD     REFERRING DIAG:  [phone] (ICD-10-CM) - Primary osteoarthritis of left knee      THERAPY DIAG:  Difficulty walking  Acute pain of left knee  Muscle weakness (generalized)  Rationale for Evaluation and Treatment: Rehabilitation  ONSET  DATE: 02/02/23 DOS Manipulation 8/29    Days since surgery: 53   Operative procedure: Left total knee arthroplasty.   SUBJECTIVE:   SUBJECTIVE STATEMENT: Pt reports knee is  stiff in the morning. Thinks DN helped.   PERTINENT HISTORY: N/A PAIN:  Are you having pain? No NPRS scale: 3/10 Pain location: posterior knee anterior knee  Pain description: "tight band feeling" Aggravating factors: bending Relieving factors: N/A, ice/rest   PRECAUTIONS: Knee  RED FLAGS: None   WEIGHT BEARING RESTRICTIONS: No  FALLS:  Has patient fallen in last 6 months? No  LIVING ENVIRONMENT: Lives with: lives with their family and lives with their spouse Lives in: House/apartment Stairs: Yes Has following equipment at home: Single point cane  OCCUPATION: Marketing executive; Office manager  PLOF: Independent  PATIENT GOALS: return to normal exercise    OBJECTIVE:   DIAGNOSTIC FINDINGS: N/A  PATIENT SURVEYS:  FOTO 39 60 @ DC 12 MCII   COGNITION: Overall cognitive status: Within functional limits for tasks assessed     SENSATION: WFL  EDEMA:  Moderate, palpable edema anteriorly and posteriorly into popliteal fossa  POSTURE: No Significant postural limitations  PALPATION: TTP of medial and lateral knee; popliteal  space; calf supple  LOWER EXTREMITY ROM:  Active ROM Right eval Left eval L 7/20 L 7/24 Lt 8/5 Lt  8/5 8/19 8/30 9/3 04/08/23 04/10/23  Hip flexion             Hip extension             Hip abduction             Hip adduction             Hip internal rotation             Hip external rotation             Knee flexion 125 75 78 86 95 91 101 CKC  107 105 90 at beginning of session, improves to 110 90 Improves to  117  Knee extension 3 -12 -5   1 0 0     Ankle dorsiflexion             Ankle plantarflexion             Ankle inversion             Ankle eversion              (Blank rows = not tested)   TODAY'S TREATMENT:  04/10/23 Patient TTP R vastus  lateralis, biceps femoris Supine heel slides with strap 10 x 10 second holds improves from 90 to 100 Contract relax in prone 2x 10 x 5 second holds improves to 108 Quadruped  rocking x 2  with 30 second hold improves to 111 Self STM to VM with theracane Manual scar mobilizations  and education for self performance 10 minutes - improves to 117 following  Deep squat at counter 1 x 10    Treatment                            9/5:  Trigger Point Dry Needling, Manual Therapy Treatment:  Initial or subsequent education regarding Trigger Point Dry Needling: Subsequent Did patient give consent to treatment with Trigger Point Dry Needling: Yes TPDN with skilled palpation and monitoring followed by STM to the following muscles: biceps femoris & VL  STM & myofascial rolling along VL and across distal knee Reviewed seated stretch Lunge stretch with AP tibial mobs- edu husband on use of sheet at home Deep squat at sink for pulloff.   04/08/23 Supine heel slides with strap 10 x 10 second holds improves from 90 to 99 Supine hamstring isometrics into green ball 5 x 10 second holds Contract relax in seated 10 x 5 second holds improves to 107 Contract relax in prone 2x 10 x 5 second holds improves to 110 Seated on stair progressive flexion stretch 5 minutes Shuttle 75, 4 x 10 with progressive knee flexion 110 following    9/3: PROM L knee seated and supine with overpressure.  STM to medial and lateral quads, posterior knee Patella mobilizations tib/fem mobilizations  Sci-fit bike 5 min full revs Prone knee flexion x10 Stair climbing 1/2 flight reciprocally up/down- single rail up, bil rail down   08/31 Manual: Manual: PROM into flexion with OP; Grade II and III PA and AP mobilization; Short sitting PROM into flexion with contract/ relax at end range with 5 sec hold. STM to medial and lateral quad; reviewed all techniques with husband per request to be performed this weekend.   LAQ with blue  theraband resistance  Squats to  chair with cues to squeeze glutes  Quad sets   8/30 Manual: PROM into flexion; Grade II and III PA and AP mobilization; STM to medial and lateral quad; Edema massage   Reviewed self stretch for home using strap 5x 5 sec hold    Treatment                            8/28:  See post op HEP   Treatment                            8/26:  Bike 6 min half revolutions Discussion of manipulation and PT schedule/frequency STM and IASTM to tib anterior and distal lateral HS PROM L knee LAQ 3# 5" hold x20   Treatment                            8/22: Pt seen for aquatic therapy today.  Treatment took place in water 3.5-4.75 ft in depth at the [facility] pool. Temp of water was 91.  Pt entered/exited the pool via stairs using step to pattern with hand rail rail.  *walking forward, back with cues for heel strike *quad and hip flex stretch suing noodle *3 way stretch using solid noodle: hamstring, gastroc, adductor and IT band *STS from 3rd then 4th step from bottom Noodle kick down yellow hip in neutral position then externally rotation 10 slow then 10 fast ea position *cycling on noodle x 6 widths; hip add/abd; hip flex/ext *Quad stretch on 2nd step 5 pulses then static hold x 5 *Step up onto 2nd step cues for eccentric lowering and controlled left knee extension     Pt requires the buoyancy and hydrostatic pressure of water for support, and to offload joints by unweighting joint load by at least 50 % in navel deep water and by at least 75-80% in chest to neck deep water.  Viscosity of the water is needed for resistance of strengthening. Water current perturbations provides challenge to standing balance requiring increased core activation.      PATIENT EDUCATION:  Education details:  anatomy, exercise progression, DOMS expectations, muscle firing,  envelope of function, HEP, POC 04/10/23: hep scar mobilizations  Person educated:  Patient Education method: Explanation, Demonstration, Tactile cues, Verbal cues, and Handouts Education comprehension: verbalized understanding, returned demonstration, verbal cues required, tactile cues required, and needs further education     HOME EXERCISE PROGRAM:  Access Code: [email or web address] URL: [email or web address] Post op program   ASSESSMENT:   CLINICAL IMPRESSION: Patient with AROM at 90 at beginning of session. Continued TTP VL and biceps femoris. Continued with contract relax and knee flexion stretches which improves to 111. Did not tolerate quadruped stretch well due to tight/trigger points in vastus medialis. Anterior scar with limited mobility, performed mobilizations with improvement to 117. Patient will continue to benefit from physical therapy in order to improve function and reduce impairment.   OBJECTIVE IMPAIRMENTS decreased ROM, decreased strength, hypomobility, increased fascial restrictions, increased muscle spasms, impaired flexibility, improper body mechanics, postural dysfunction, and pain.    ACTIVITY LIMITATIONS carrying, lifting, sitting, stairs, locomotion level   PARTICIPATION LIMITATIONS: driving, shopping, community activity, occupation, and yard work   PERSONAL FACTORS Age, Time since onset of injury/illness/exacerbation, and 1 comorbidity:    are also affecting patient's functional outcome.        GOALS:  SHORT TERM GOALS: Target date: 04/02/2023     Pt will become independent with HEP in order to demonstrate synthesis of PT education.  Goal status: met   2.  Pt will be able to demonstrate at least 110 deg knee flexion  in order to demonstrate functional improvement in LE function for self-care and house hold duties.    Goal status: ongoing   3.  Pt will score at least 12 pt increase on FOTO to demonstrate functional improvement in MCII and pt perceived function.     Goal status: INITIAL       LONG TERM GOALS: Target  date: 05/20/2023     Pt  will become independent with final HEP in order to demonstrate synthesis of PT education.    Goal status: ongoing   2.  Pt will be able to demonstrate/report ability to walk >30 mins without pain in order to demonstrate functional improvement and tolerance to exercise and community mobility.    Goal status: met   3.  Pt will be able to demonstrate full depth squat in order to demonstrate functional improvement in LE function for self-care and house hold duties.  ongoing  4.  Pt will score >/= 60 on FOTO to demonstrate improvement in perceived L knee function.  Baseline:  Goal status: ongoing  5.  Pt will be able to demonstrate full L knee ROM in order to demonstrate functional improvement in LE function for self-care and house hold duties.  ongoing      PLAN: PT FREQUENCY: 1-2x/week Additional 3rd visit in the week of 8/19-8/24/24 for aquatics appt 6/week for 2 weeks &2-3/week following   PT DURATION: POC date   PLANNED INTERVENTIONS: Therapeutic exercises, Therapeutic activity, Neuromuscular re-education, Balance training, Gait training, Patient/Family education, Self Care, Joint mobilization, Joint manipulation, Orthotic/Fit training, Aquatic Therapy, Dry Needling, Electrical stimulation, Spinal manipulation, Spinal mobilization, Cryotherapy, Moist heat, Compression bandaging, scar mobilization, Splintting, Taping, Vasopneumatic device, Traction, Ultrasound, Ionotophoresis 4mg /ml Dexamethasone, Manual therapy, and Re-evaluation   PLAN FOR NEXT SESSION: PROM into flexion, work back into there-ex as tolerated. Cupping/ DN   Reola Mosher Shelvie Salsberry, PT 04/10/2023, 10:20 AM

## 2023-04-11 ENCOUNTER — Ambulatory Visit (HOSPITAL_BASED_OUTPATIENT_CLINIC_OR_DEPARTMENT_OTHER): Payer: HMO | Admitting: Physical Therapy

## 2023-04-11 ENCOUNTER — Encounter (HOSPITAL_BASED_OUTPATIENT_CLINIC_OR_DEPARTMENT_OTHER): Payer: Self-pay | Admitting: Physical Therapy

## 2023-04-11 DIAGNOSIS — M6281 Muscle weakness (generalized): Secondary | ICD-10-CM

## 2023-04-11 DIAGNOSIS — R262 Difficulty in walking, not elsewhere classified: Secondary | ICD-10-CM | POA: Diagnosis not present

## 2023-04-11 DIAGNOSIS — M25562 Pain in left knee: Secondary | ICD-10-CM

## 2023-04-11 NOTE — Therapy (Signed)
OUTPATIENT PHYSICAL THERAPY LOWER EXTREMITY TREATMENT   Patient Name: Ashley Blair MRN: [id number] DOB:Feb 03, 1956, 67 y.o., female Today's Date: 04/11/2023  END OF SESSION:  PT End of Session - 04/11/23 0837     Visit Number 7    Number of Visits 16    Date for PT Re-Evaluation 05/29/23    Authorization Type HTA    PT Start Time 0830    PT Stop Time 0917    PT Time Calculation (min) 47 min    Activity Tolerance Patient tolerated treatment well    Behavior During Therapy [facility] for tasks assessed/performed               Past Medical History:  Diagnosis Date   Anxiety    Arthritis    Elevated hemoglobin A1c 2019   5.7   Family history of DES exposure    Fibrocystic breast    GERD (gastroesophageal reflux disease)    HPV (human papilloma virus) infection    Hypercholesterolemia    OCD (obsessive compulsive disorder)    Pre-diabetes    S/P excision of lipoma    Past Surgical History:  Procedure Laterality Date   ANKLE SURGERY     broken as a child   BREAST BIOPSY Right    COLONOSCOPY     DILATION AND CURETTAGE OF UTERUS     LIPOMA EXCISION     rib cage   TOTAL KNEE ARTHROPLASTY Left 02/02/2023   Procedure: LEFT TOTAL KNEE ARTHROPLASTY;  Surgeon: Tarry Kos, MD;  Location: [facility] OR;  Service: Orthopedics;  Laterality: Left;   TRIGGER FINGER RELEASE Left 03/2013   thumb   Patient Active Problem List   Diagnosis Date Noted   Status post total left knee replacement 02/02/2023   Primary osteoarthritis of left knee 02/01/2023   Family history of breast cancer in mother 12/12/2013   H/O diethylstilbestrol (DES) exposure in utero 12/12/2013    PCP:  Thana Ates, MD       REFERRING PROVIDER:  Tarry Kos, MD     REFERRING DIAG:  [phone] (ICD-10-CM) - Primary osteoarthritis of left knee      THERAPY DIAG:  Difficulty walking  Acute pain of left knee  Muscle weakness (generalized)  Rationale for Evaluation and Treatment:  Rehabilitation  ONSET DATE: 02/02/23 DOS Manipulation 8/29    Days since surgery: 53   Operative procedure: Left total knee arthroplasty.   SUBJECTIVE:   SUBJECTIVE STATEMENT: Pt states that she had a lot of continued tension in her medial quad. " I killed myself with trying to my knot to release". I pushed on it and rubbed it, today it feels better.   PERTINENT HISTORY: N/A PAIN:  Are you having pain? No NPRS scale: 3/10 Pain location: posterior knee anterior knee  Pain description: "tight band feeling" Aggravating factors: bending Relieving factors: N/A, ice/rest   PRECAUTIONS: Knee  RED FLAGS: None   WEIGHT BEARING RESTRICTIONS: No  FALLS:  Has patient fallen in last 6 months? No  LIVING ENVIRONMENT: Lives with: lives with their family and lives with their spouse Lives in: House/apartment Stairs: Yes Has following equipment at home: Single point cane  OCCUPATION: Marketing executive; Office manager  PLOF: Independent  PATIENT GOALS: return to normal exercise    OBJECTIVE:   DIAGNOSTIC FINDINGS: N/A  PATIENT SURVEYS:  FOTO 39 60 @ DC 12 MCII   COGNITION: Overall cognitive status: Within functional limits for tasks assessed     SENSATION: WFL  EDEMA:  Moderate, palpable edema anteriorly and posteriorly into popliteal fossa  POSTURE: No Significant postural limitations  PALPATION: TTP of medial and lateral knee; popliteal space; calf supple  LOWER EXTREMITY ROM:  Active ROM Right eval Left eval L 7/20 L 7/24 Lt 8/5 Lt  8/5 8/19 8/30 9/3 04/08/23 04/10/23  Hip flexion             Hip extension             Hip abduction             Hip adduction             Hip internal rotation             Hip external rotation             Knee flexion 125 75 78 86 95 91 101 CKC  107 105 90 at beginning of session, improves to 110 90 Improves to  117  Knee extension 3 -12 -5   1 0 0     Ankle dorsiflexion             Ankle plantarflexion              Ankle inversion             Ankle eversion              (Blank rows = not tested)   TODAY'S TREATMENT:  04/11/23 Patient TTP R vastus lateralis, biceps femoris Supine heel slides with strap 10 x 10 second holds improves from 95 to 116 Contract relax in short sitting 2x 5 x 5 second holds  Active walking around clinic with focus on knee flexion.  Heel taps at stairs to focus on quad control with descending stairs. Deep squat at counter 1 x 10   04/10/23 Patient TTP R vastus lateralis, biceps femoris Supine heel slides with strap 10 x 10 second holds improves from 90 to 100 Contract relax in prone 2x 10 x 5 second holds improves to 108 Quadruped  rocking x 2  with 30 second hold improves to 111 Self STM to VM with theracane Manual scar mobilizations  and education for self performance 10 minutes - improves to 117 following  Deep squat at counter 1 x 10    Treatment                            9/5:  Trigger Point Dry Needling, Manual Therapy Treatment:  Initial or subsequent education regarding Trigger Point Dry Needling: Subsequent Did patient give consent to treatment with Trigger Point Dry Needling: Yes TPDN with skilled palpation and monitoring followed by STM to the following muscles: biceps femoris & VL  STM & myofascial rolling along VL and across distal knee Reviewed seated stretch Lunge stretch with AP tibial mobs- edu husband on use of sheet at home Deep squat at sink for pulloff.   04/08/23 Supine heel slides with strap 10 x 10 second holds improves from 90 to 99 Supine hamstring isometrics into green ball 5 x 10 second holds Contract relax in seated 10 x 5 second holds improves to 107 Contract relax in prone 2x 10 x 5 second holds improves to 110 Seated on stair progressive flexion stretch 5 minutes Shuttle 75, 4 x 10 with progressive knee flexion 110 following    9/3: PROM L knee seated and supine with overpressure.  STM to medial and lateral quads, posterior  knee Patella mobilizations tib/fem mobilizations  Sci-fit bike 5 min full revs Prone knee flexion x10 Stair climbing 1/2 flight reciprocally up/down- single rail up, bil rail down   08/31 Manual: Manual: PROM into flexion with OP; Grade II and III PA and AP mobilization; Short sitting PROM into flexion with contract/ relax at end range with 5 sec hold. STM to medial and lateral quad; reviewed all techniques with husband per request to be performed this weekend.   LAQ with blue theraband resistance  Squats to chair with cues to squeeze glutes  Quad sets   8/30 Manual: PROM into flexion; Grade II and III PA and AP mobilization; STM to medial and lateral quad; Edema massage   Reviewed self stretch for home using strap 5x 5 sec hold    Treatment                            8/28:  See post op HEP   Treatment                            8/26:  Bike 6 min half revolutions Discussion of manipulation and PT schedule/frequency STM and IASTM to tib anterior and distal lateral HS PROM L knee LAQ 3# 5" hold x20   Treatment                            8/22: Pt seen for aquatic therapy today.  Treatment took place in water 3.5-4.75 ft in depth at the [facility] pool. Temp of water was 91.  Pt entered/exited the pool via stairs using step to pattern with hand rail rail.  *walking forward, back with cues for heel strike *quad and hip flex stretch suing noodle *3 way stretch using solid noodle: hamstring, gastroc, adductor and IT band *STS from 3rd then 4th step from bottom Noodle kick down yellow hip in neutral position then externally rotation 10 slow then 10 fast ea position *cycling on noodle x 6 widths; hip add/abd; hip flex/ext *Quad stretch on 2nd step 5 pulses then static hold x 5 *Step up onto 2nd step cues for eccentric lowering and controlled left knee extension     Pt requires the buoyancy and hydrostatic pressure of water for support, and to offload joints by  unweighting joint load by at least 50 % in navel deep water and by at least 75-80% in chest to neck deep water.  Viscosity of the water is needed for resistance of strengthening. Water current perturbations provides challenge to standing balance requiring increased core activation.      PATIENT EDUCATION:  Education details:  anatomy, exercise progression, DOMS expectations, muscle firing,  envelope of function, HEP, POC 04/10/23: hep scar mobilizations  Person educated: Patient Education method: Explanation, Demonstration, Tactile cues, Verbal cues, and Handouts Education comprehension: verbalized understanding, returned demonstration, verbal cues required, tactile cues required, and needs further education     HOME EXERCISE PROGRAM:  Access Code: [email or web address] URL: [email or web address] Post op program   ASSESSMENT:   CLINICAL IMPRESSION: Patient with AROM at 95 at beginning of session. Continued TTP VL and biceps femoris. Continued with contract relax and knee flexion stretches which improves to 116. Introduced heel taps due to reports of difficulty with stairs. Also addressed gait deviations with improved gait noted with verbal cues. Pt with tendency to hip hike. Discussed slowing down  with exercises to give body time to rest. Requested pt to walk instead of doing 3rd set of exercises. Plan to DN next session per pt request. Patient will continue to benefit from physical therapy in order to improve function and reduce impairment.   OBJECTIVE IMPAIRMENTS decreased ROM, decreased strength, hypomobility, increased fascial restrictions, increased muscle spasms, impaired flexibility, improper body mechanics, postural dysfunction, and pain.    ACTIVITY LIMITATIONS carrying, lifting, sitting, stairs, locomotion level   PARTICIPATION LIMITATIONS: driving, shopping, community activity, occupation, and yard work   PERSONAL FACTORS Age, Time since onset of  injury/illness/exacerbation, and 1 comorbidity:    are also affecting patient's functional outcome.        GOALS:   SHORT TERM GOALS: Target date: 04/02/2023     Pt will become independent with HEP in order to demonstrate synthesis of PT education.  Goal status: met   2.  Pt will be able to demonstrate at least 110 deg knee flexion  in order to demonstrate functional improvement in LE function for self-care and house hold duties.    Goal status: ongoing   3.  Pt will score at least 12 pt increase on FOTO to demonstrate functional improvement in MCII and pt perceived function.     Goal status: INITIAL       LONG TERM GOALS: Target date: 05/20/2023     Pt  will become independent with final HEP in order to demonstrate synthesis of PT education.    Goal status: ongoing   2.  Pt will be able to demonstrate/report ability to walk >30 mins without pain in order to demonstrate functional improvement and tolerance to exercise and community mobility.    Goal status: met   3.  Pt will be able to demonstrate full depth squat in order to demonstrate functional improvement in LE function for self-care and house hold duties.  ongoing  4.  Pt will score >/= 60 on FOTO to demonstrate improvement in perceived L knee function.  Baseline:  Goal status: ongoing  5.  Pt will be able to demonstrate full L knee ROM in order to demonstrate functional improvement in LE function for self-care and house hold duties.  ongoing      PLAN: PT FREQUENCY: 1-2x/week Additional 3rd visit in the week of 8/19-8/24/24 for aquatics appt 6/week for 2 weeks &2-3/week following   PT DURATION: POC date   PLANNED INTERVENTIONS: Therapeutic exercises, Therapeutic activity, Neuromuscular re-education, Balance training, Gait training, Patient/Family education, Self Care, Joint mobilization, Joint manipulation, Orthotic/Fit training, Aquatic Therapy, Dry Needling, Electrical stimulation, Spinal manipulation,  Spinal mobilization, Cryotherapy, Moist heat, Compression bandaging, scar mobilization, Splintting, Taping, Vasopneumatic device, Traction, Ultrasound, Ionotophoresis 4mg /ml Dexamethasone, Manual therapy, and Re-evaluation   PLAN FOR NEXT SESSION: PROM into flexion, work back into there-ex as tolerated. Cupping/ DN   Champ Mungo, PT 04/11/2023, 9:51 AM

## 2023-04-13 ENCOUNTER — Encounter (HOSPITAL_BASED_OUTPATIENT_CLINIC_OR_DEPARTMENT_OTHER): Payer: Self-pay | Admitting: Physical Therapy

## 2023-04-13 ENCOUNTER — Ambulatory Visit (HOSPITAL_BASED_OUTPATIENT_CLINIC_OR_DEPARTMENT_OTHER): Payer: HMO | Admitting: Physical Therapy

## 2023-04-13 DIAGNOSIS — R262 Difficulty in walking, not elsewhere classified: Secondary | ICD-10-CM | POA: Diagnosis not present

## 2023-04-13 DIAGNOSIS — M6281 Muscle weakness (generalized): Secondary | ICD-10-CM

## 2023-04-13 DIAGNOSIS — M25562 Pain in left knee: Secondary | ICD-10-CM

## 2023-04-13 NOTE — Therapy (Signed)
OUTPATIENT PHYSICAL THERAPY LOWER EXTREMITY TREATMENT   Patient Name: Ashley Blair MRN: [id number] DOB:Aug 01, 1956, 67 y.o., female Today's Date: 04/13/2023  END OF SESSION:  PT End of Session - 04/13/23 0936     Visit Number 8    Number of Visits 16    Date for PT Re-Evaluation 05/29/23    Authorization Type HTA    PT Start Time 0931    PT Stop Time 1014    PT Time Calculation (min) 43 min    Activity Tolerance Patient tolerated treatment well    Behavior During Therapy WFL for tasks assessed/performed                Past Medical History:  Diagnosis Date   Anxiety    Arthritis    Elevated hemoglobin A1c 2019   5.7   Family history of DES exposure    Fibrocystic breast    GERD (gastroesophageal reflux disease)    HPV (human papilloma virus) infection    Hypercholesterolemia    OCD (obsessive compulsive disorder)    Pre-diabetes    S/P excision of lipoma    Past Surgical History:  Procedure Laterality Date   ANKLE SURGERY     broken as a child   BREAST BIOPSY Right    COLONOSCOPY     DILATION AND CURETTAGE OF UTERUS     LIPOMA EXCISION     rib cage   TOTAL KNEE ARTHROPLASTY Left 02/02/2023   Procedure: LEFT TOTAL KNEE ARTHROPLASTY;  Surgeon: Tarry Kos, MD;  Location: [facility] OR;  Service: Orthopedics;  Laterality: Left;   TRIGGER FINGER RELEASE Left 03/2013   thumb   Patient Active Problem List   Diagnosis Date Noted   Status post total left knee replacement 02/02/2023   Primary osteoarthritis of left knee 02/01/2023   Family history of breast cancer in mother 12/12/2013   H/O diethylstilbestrol (DES) exposure in utero 12/12/2013    PCP:  Thana Ates, MD       REFERRING PROVIDER:  Tarry Kos, MD     REFERRING DIAG:  [phone] (ICD-10-CM) - Primary osteoarthritis of left knee      THERAPY DIAG:  Difficulty walking  Acute pain of left knee  Muscle weakness (generalized)  Rationale for Evaluation and Treatment:  Rehabilitation  ONSET DATE: 02/02/23 DOS Manipulation 8/29    Days since surgery: 53   Operative procedure: Left total knee arthroplasty.   SUBJECTIVE:   SUBJECTIVE STATEMENT: Pt states the VL and lateral HS area is very painful with releasing from bending. The knee is continuing to improve flexion. The pain is limiting her bending.   PERTINENT HISTORY: N/A PAIN:  Are you having pain? No NPRS scale: 3/10 Pain location: posterior knee anterior knee  Pain description: "tight band feeling" Aggravating factors: bending Relieving factors: N/A, ice/rest   PRECAUTIONS: Knee  RED FLAGS: None   WEIGHT BEARING RESTRICTIONS: No  FALLS:  Has patient fallen in last 6 months? No  LIVING ENVIRONMENT: Lives with: lives with their family and lives with their spouse Lives in: House/apartment Stairs: Yes Has following equipment at home: Single point cane  OCCUPATION: Marketing executive; Office manager  PLOF: Independent  PATIENT GOALS: return to normal exercise    OBJECTIVE:   DIAGNOSTIC FINDINGS: N/A  PATIENT SURVEYS:  FOTO 39 60 @ DC 12 MCII   COGNITION: Overall cognitive status: Within functional limits for tasks assessed     SENSATION: WFL  EDEMA:  Moderate, palpable edema anteriorly and posteriorly  into popliteal fossa  POSTURE: No Significant postural limitations  PALPATION: TTP of medial and lateral knee; popliteal space; calf supple  LOWER EXTREMITY ROM:  Active ROM Right eval Left eval L 7/20 L 7/24 Lt 8/5 Lt  8/5 8/19 8/30 9/3 04/08/23 04/10/23  Hip flexion             Hip extension             Hip abduction             Hip adduction             Hip internal rotation             Hip external rotation             Knee flexion 125 75 78 86 95 91 101 CKC  107 105 90 at beginning of session, improves to 110 90 Improves to  117  Knee extension 3 -12 -5   1 0 0     Ankle dorsiflexion             Ankle plantarflexion             Ankle inversion              Ankle eversion              (Blank rows = not tested)   TODAY'S TREATMENT:    9/9  Trigger Point Dry-Needling  Treatment instructions: Expect mild to moderate muscle soreness. S/S of pneumothorax if dry needled over a lung field, and to seek immediate medical attention should they occur. Patient verbalized understanding of these instructions and education.   Patient Consent Given: Yes Education (verbally/handout)provided: Yes Muscles treated: L VL, L lateral gastroc, L biceps femoris Electrical stimulation performed: no Parameters:   N/A Treatment response/outcome: soft tissue extensibility improvement. LTR elicited   STM to mm muscles treated Skin rolling    Contract relax in supine 2x10 5s holds  Deep squat position at rail (painful) Up2 down 1 SL eccentric on shuttle 50lbs 2x10  04/11/23 Patient TTP R vastus lateralis, biceps femoris Supine heel slides with strap 10 x 10 second holds improves from 95 to 116 Contract relax in short sitting 2x 5 x 5 second holds  Active walking around clinic with focus on knee flexion.  Heel taps at stairs to focus on quad control with descending stairs. Deep squat at counter 1 x 10   04/10/23 Patient TTP R vastus lateralis, biceps femoris Supine heel slides with strap 10 x 10 second holds improves from 90 to 100 Contract relax in prone 2x 10 x 5 second holds improves to 108 Quadruped  rocking x 2  with 30 second hold improves to 111 Self STM to VM with theracane Manual scar mobilizations  and education for self performance 10 minutes - improves to 117 following  Deep squat at counter 1 x 10    Treatment                            9/5:  Trigger Point Dry Needling, Manual Therapy Treatment:  Initial or subsequent education regarding Trigger Point Dry Needling: Subsequent Did patient give consent to treatment with Trigger Point Dry Needling: Yes TPDN with skilled palpation and monitoring followed by STM to the following muscles:  biceps femoris & VL  STM & myofascial rolling along VL and across distal knee Reviewed seated stretch Lunge stretch with AP  tibial mobs- edu husband on use of sheet at home Deep squat at sink for pulloff.   04/08/23 Supine heel slides with strap 10 x 10 second holds improves from 90 to 99 Supine hamstring isometrics into green ball 5 x 10 second holds Contract relax in seated 10 x 5 second holds improves to 107 Contract relax in prone 2x 10 x 5 second holds improves to 110 Seated on stair progressive flexion stretch 5 minutes Shuttle 75, 4 x 10 with progressive knee flexion 110 following    9/3: PROM L knee seated and supine with overpressure.  STM to medial and lateral quads, posterior knee Patella mobilizations tib/fem mobilizations  Sci-fit bike 5 min full revs Prone knee flexion x10 Stair climbing 1/2 flight reciprocally up/down- single rail up, bil rail down   08/31 Manual: Manual: PROM into flexion with OP; Grade II and III PA and AP mobilization; Short sitting PROM into flexion with contract/ relax at end range with 5 sec hold. STM to medial and lateral quad; reviewed all techniques with husband per request to be performed this weekend.   LAQ with blue theraband resistance  Squats to chair with cues to squeeze glutes  Quad sets   8/30 Manual: PROM into flexion; Grade II and III PA and AP mobilization; STM to medial and lateral quad; Edema massage   Reviewed self stretch for home using strap 5x 5 sec hold    Treatment                            8/28:  See post op HEP   Treatment                            8/26:  Bike 6 min half revolutions Discussion of manipulation and PT schedule/frequency STM and IASTM to tib anterior and distal lateral HS PROM L knee LAQ 3# 5" hold x20   Treatment                            8/22: Pt seen for aquatic therapy today.  Treatment took place in water 3.5-4.75 ft in depth at the [facility] pool. Temp of water was 91.   Pt entered/exited the pool via stairs using step to pattern with hand rail rail.  *walking forward, back with cues for heel strike *quad and hip flex stretch suing noodle *3 way stretch using solid noodle: hamstring, gastroc, adductor and IT band *STS from 3rd then 4th step from bottom Noodle kick down yellow hip in neutral position then externally rotation 10 slow then 10 fast ea position *cycling on noodle x 6 widths; hip add/abd; hip flex/ext *Quad stretch on 2nd step 5 pulses then static hold x 5 *Step up onto 2nd step cues for eccentric lowering and controlled left knee extension     Pt requires the buoyancy and hydrostatic pressure of water for support, and to offload joints by unweighting joint load by at least 50 % in navel deep water and by at least 75-80% in chest to neck deep water.  Viscosity of the water is needed for resistance of strengthening. Water current perturbations provides challenge to standing balance requiring increased core activation.      PATIENT EDUCATION:  Education details:  anatomy, exercise progression, DOMS expectations, muscle firing,  envelope of function, HEP, POC 04/10/23: hep scar mobilizations  Person  educated: Patient Education method: Explanation, Demonstration, Tactile cues, Verbal cues, and Handouts Education comprehension: verbalized understanding, returned demonstration, verbal cues required, tactile cues required, and needs further education     HOME EXERCISE PROGRAM:  Access Code: [email or web address] URL: [email or web address] Post op program   ASSESSMENT:   CLINICAL IMPRESSION: Pt with good response to TPDN with improvement in soft tissue extensibility of L quad and gatsroc. Pt does have pain with BW loaded exercise so regressed to loaded stretch in reduce weight environment. Pt able to reach 111 flexion at end of session. Plan to continue with heavy ROM focus and reintro of weighted exercise as tolerated.  Patient will  continue to benefit from physical therapy in order to improve function and reduce impairment.   OBJECTIVE IMPAIRMENTS decreased ROM, decreased strength, hypomobility, increased fascial restrictions, increased muscle spasms, impaired flexibility, improper body mechanics, postural dysfunction, and pain.    ACTIVITY LIMITATIONS carrying, lifting, sitting, stairs, locomotion level   PARTICIPATION LIMITATIONS: driving, shopping, community activity, occupation, and yard work   PERSONAL FACTORS Age, Time since onset of injury/illness/exacerbation, and 1 comorbidity:    are also affecting patient's functional outcome.        GOALS:   SHORT TERM GOALS: Target date: 04/02/2023     Pt will become independent with HEP in order to demonstrate synthesis of PT education.  Goal status: met   2.  Pt will be able to demonstrate at least 110 deg knee flexion  in order to demonstrate functional improvement in LE function for self-care and house hold duties.    Goal status: ongoing   3.  Pt will score at least 12 pt increase on FOTO to demonstrate functional improvement in MCII and pt perceived function.     Goal status: INITIAL       LONG TERM GOALS: Target date: 05/20/2023     Pt  will become independent with final HEP in order to demonstrate synthesis of PT education.    Goal status: ongoing   2.  Pt will be able to demonstrate/report ability to walk >30 mins without pain in order to demonstrate functional improvement and tolerance to exercise and community mobility.    Goal status: met   3.  Pt will be able to demonstrate full depth squat in order to demonstrate functional improvement in LE function for self-care and house hold duties.  ongoing  4.  Pt will score >/= 60 on FOTO to demonstrate improvement in perceived L knee function.  Baseline:  Goal status: ongoing  5.  Pt will be able to demonstrate full L knee ROM in order to demonstrate functional improvement in LE function for  self-care and house hold duties.  ongoing      PLAN: PT FREQUENCY: 1-2x/week Additional 3rd visit in the week of 8/19-8/24/24 for aquatics appt 6/week for 2 weeks &2-3/week following   PT DURATION: POC date   PLANNED INTERVENTIONS: Therapeutic exercises, Therapeutic activity, Neuromuscular re-education, Balance training, Gait training, Patient/Family education, Self Care, Joint mobilization, Joint manipulation, Orthotic/Fit training, Aquatic Therapy, Dry Needling, Electrical stimulation, Spinal manipulation, Spinal mobilization, Cryotherapy, Moist heat, Compression bandaging, scar mobilization, Splintting, Taping, Vasopneumatic device, Traction, Ultrasound, Ionotophoresis 4mg /ml Dexamethasone, Manual therapy, and Re-evaluation   PLAN FOR NEXT SESSION: PROM into flexion, work back into there-ex as tolerated. Cupping/ DN   Zebedee Iba, PT 04/13/2023, 10:20 AM

## 2023-04-14 ENCOUNTER — Ambulatory Visit (HOSPITAL_BASED_OUTPATIENT_CLINIC_OR_DEPARTMENT_OTHER): Payer: HMO | Admitting: Physical Therapy

## 2023-04-14 ENCOUNTER — Encounter (HOSPITAL_BASED_OUTPATIENT_CLINIC_OR_DEPARTMENT_OTHER): Payer: Self-pay | Admitting: Physical Therapy

## 2023-04-14 DIAGNOSIS — R262 Difficulty in walking, not elsewhere classified: Secondary | ICD-10-CM | POA: Diagnosis not present

## 2023-04-14 DIAGNOSIS — M25562 Pain in left knee: Secondary | ICD-10-CM

## 2023-04-14 DIAGNOSIS — M6281 Muscle weakness (generalized): Secondary | ICD-10-CM

## 2023-04-14 NOTE — Therapy (Signed)
OUTPATIENT PHYSICAL THERAPY LOWER EXTREMITY TREATMENT   Patient Name: Itta Schechter MRN: [id number] DOB:01-Mar-1956, 67 y.o., female Today's Date: 04/14/2023  END OF SESSION:  PT End of Session - 04/14/23 0756     Visit Number 9    Number of Visits 16    Date for PT Re-Evaluation 05/29/23    Authorization Type HTA    PT Start Time 0800    PT Stop Time 0840    PT Time Calculation (min) 40 min    Activity Tolerance Patient tolerated treatment well    Behavior During Therapy WFL for tasks assessed/performed                Past Medical History:  Diagnosis Date   Anxiety    Arthritis    Elevated hemoglobin A1c 2019   5.7   Family history of DES exposure    Fibrocystic breast    GERD (gastroesophageal reflux disease)    HPV (human papilloma virus) infection    Hypercholesterolemia    OCD (obsessive compulsive disorder)    Pre-diabetes    S/P excision of lipoma    Past Surgical History:  Procedure Laterality Date   ANKLE SURGERY     broken as a child   BREAST BIOPSY Right    COLONOSCOPY     DILATION AND CURETTAGE OF UTERUS     LIPOMA EXCISION     rib cage   TOTAL KNEE ARTHROPLASTY Left 02/02/2023   Procedure: LEFT TOTAL KNEE ARTHROPLASTY;  Surgeon: Tarry Kos, MD;  Location: [facility] OR;  Service: Orthopedics;  Laterality: Left;   TRIGGER FINGER RELEASE Left 03/2013   thumb   Patient Active Problem List   Diagnosis Date Noted   Status post total left knee replacement 02/02/2023   Primary osteoarthritis of left knee 02/01/2023   Family history of breast cancer in mother 12/12/2013   H/O diethylstilbestrol (DES) exposure in utero 12/12/2013    PCP:  Thana Ates, MD       REFERRING PROVIDER:  Tarry Kos, MD     REFERRING DIAG:  [phone] (ICD-10-CM) - Primary osteoarthritis of left knee      THERAPY DIAG:  Difficulty walking  Acute pain of left knee  Muscle weakness (generalized)  Rationale for Evaluation and Treatment:  Rehabilitation  ONSET DATE: 02/02/23 DOS Manipulation 8/29    Days since surgery: 53   Operative procedure: Left total knee arthroplasty.   SUBJECTIVE:   SUBJECTIVE STATEMENT: Pt states knee is still tight. Still getting band across the front.   PERTINENT HISTORY: N/A PAIN:  Are you having pain? No NPRS scale: 2-3/10 Pain location: posterior knee anterior knee  Pain description: "tight band feeling" Aggravating factors: bending Relieving factors: N/A, ice/rest   PRECAUTIONS: Knee  RED FLAGS: None   WEIGHT BEARING RESTRICTIONS: No  FALLS:  Has patient fallen in last 6 months? No  LIVING ENVIRONMENT: Lives with: lives with their family and lives with their spouse Lives in: House/apartment Stairs: Yes Has following equipment at home: Single point cane  OCCUPATION: Marketing executive; Office manager  PLOF: Independent  PATIENT GOALS: return to normal exercise    OBJECTIVE:   DIAGNOSTIC FINDINGS: N/A  PATIENT SURVEYS:  FOTO 39 60 @ DC 12 [facility]   COGNITION: Overall cognitive status: Within functional limits for tasks assessed     SENSATION: WFL  EDEMA:  Moderate, palpable edema anteriorly and posteriorly into popliteal fossa  POSTURE: No Significant postural limitations  PALPATION: TTP of medial and lateral  knee; popliteal space; calf supple  LOWER EXTREMITY ROM:  Active ROM Right eval Left eval L 7/20 L 7/24 Lt 8/5 Lt  8/5 8/19 8/30 9/3 04/08/23 04/10/23 04/14/23  Hip flexion              Hip extension              Hip abduction              Hip adduction              Hip internal rotation              Hip external rotation              Knee flexion 125 75 78 86 95 91 101 CKC  107 105 90 at beginning of session, improves to 110 90 Improves to  117 92 at beginning of session, improves to 115  Knee extension 3 -12 -5   1 0 0      Ankle dorsiflexion              Ankle plantarflexion              Ankle inversion              Ankle eversion                (Blank rows = not tested)   TODAY'S TREATMENT:  04/14/23 Patient TTP R vastus lateralis, biceps femoris Supine heel slides with strap 10 x 10 second holds improves from 90 to 102 Contract relax in prone 2x 10 x 5 second holds improves to 112 Manual scar mobilizations  and education for self performance 10 minutes  Contract relax in prone 1x 10 x 5 second holds improves to 115 Deep squat at counter 2 x 10  Standing IT band stretch 3 x 20 second holds Lunge LLE leading 2 x 10  Hamstring curl machine 25# 1 x 10     9/9  Trigger Point Dry-Needling  Treatment instructions: Expect mild to moderate muscle soreness. S/S of pneumothorax if dry needled over a lung field, and to seek immediate medical attention should they occur. Patient verbalized understanding of these instructions and education.   Patient Consent Given: Yes Education (verbally/handout)provided: Yes Muscles treated: L VL, L lateral gastroc, L biceps femoris Electrical stimulation performed: no Parameters:   N/A Treatment response/outcome: soft tissue extensibility improvement. LTR elicited   STM to mm muscles treated Skin rolling    Contract relax in supine 2x10 5s holds  Deep squat position at rail (painful) Up2 down 1 SL eccentric on shuttle 50lbs 2x10  04/11/23 Patient TTP R vastus lateralis, biceps femoris Supine heel slides with strap 10 x 10 second holds improves from 95 to 116 Contract relax in short sitting 2x 5 x 5 second holds  Active walking around clinic with focus on knee flexion.  Heel taps at stairs to focus on quad control with descending stairs. Deep squat at counter 1 x 10   04/10/23 Patient TTP R vastus lateralis, biceps femoris Supine heel slides with strap 10 x 10 second holds improves from 90 to 100 Contract relax in prone 2x 10 x 5 second holds improves to 108 Quadruped  rocking x 2  with 30 second hold improves to 111 Self STM to VM with theracane Manual scar mobilizations   and education for self performance 10 minutes - improves to 117 following  Deep squat at counter 1 x 10  Treatment                            9/5:  Trigger Point Dry Needling, Manual Therapy Treatment:  Initial or subsequent education regarding Trigger Point Dry Needling: Subsequent Did patient give consent to treatment with Trigger Point Dry Needling: Yes TPDN with skilled palpation and monitoring followed by STM to the following muscles: biceps femoris & VL  STM & myofascial rolling along VL and across distal knee Reviewed seated stretch Lunge stretch with AP tibial mobs- edu husband on use of sheet at home Deep squat at sink for pulloff.   04/08/23 Supine heel slides with strap 10 x 10 second holds improves from 90 to 99 Supine hamstring isometrics into green ball 5 x 10 second holds Contract relax in seated 10 x 5 second holds improves to 107 Contract relax in prone 2x 10 x 5 second holds improves to 110 Seated on stair progressive flexion stretch 5 minutes Shuttle 75, 4 x 10 with progressive knee flexion 110 following    9/3: PROM L knee seated and supine with overpressure.  STM to medial and lateral quads, posterior knee Patella mobilizations tib/fem mobilizations  Sci-fit bike 5 min full revs Prone knee flexion x10 Stair climbing 1/2 flight reciprocally up/down- single rail up, bil rail down   08/31 Manual: Manual: PROM into flexion with OP; Grade II and III PA and AP mobilization; Short sitting PROM into flexion with contract/ relax at end range with 5 sec hold. STM to medial and lateral quad; reviewed all techniques with husband per request to be performed this weekend.   LAQ with blue theraband resistance  Squats to chair with cues to squeeze glutes  Quad sets   8/30 Manual: PROM into flexion; Grade II and III PA and AP mobilization; STM to medial and lateral quad; Edema massage   Reviewed self stretch for home using strap 5x 5 sec hold    Treatment                             8/28:  See post op HEP   Treatment                            8/26:  Bike 6 min half revolutions Discussion of manipulation and PT schedule/frequency STM and IASTM to tib anterior and distal lateral HS PROM L knee LAQ 3# 5" hold x20   Treatment                            8/22: Pt seen for aquatic therapy today.  Treatment took place in water 3.5-4.75 ft in depth at the [facility] pool. Temp of water was 91.  Pt entered/exited the pool via stairs using step to pattern with hand rail rail.  *walking forward, back with cues for heel strike *quad and hip flex stretch suing noodle *3 way stretch using solid noodle: hamstring, gastroc, adductor and IT band *STS from 3rd then 4th step from bottom Noodle kick down yellow hip in neutral position then externally rotation 10 slow then 10 fast ea position *cycling on noodle x 6 widths; hip add/abd; hip flex/ext *Quad stretch on 2nd step 5 pulses then static hold x 5 *Step up onto 2nd step cues for eccentric lowering and controlled left knee extension  Pt requires the buoyancy and hydrostatic pressure of water for support, and to offload joints by unweighting joint load by at least 50 % in navel deep water and by at least 75-80% in chest to neck deep water.  Viscosity of the water is needed for resistance of strengthening. Water current perturbations provides challenge to standing balance requiring increased core activation.      PATIENT EDUCATION:  Education details:  anatomy, exercise progression, DOMS expectations, muscle firing,  envelope of function, HEP, POC 04/10/23: hep scar mobilizations  Person educated: Patient Education method: Explanation, Demonstration, Tactile cues, Verbal cues, and Handouts Education comprehension: verbalized understanding, returned demonstration, verbal cues required, tactile cues required, and needs further education     HOME EXERCISE PROGRAM:  Access Code: [email or web address] URL:  [email or web address] Post op program   ASSESSMENT:   CLINICAL IMPRESSION: Patient with L knee AROM at 92 at beginning of session likely due to that she just is beginning her day. Continued with exercises target at AROM with 115 following. Continued symptoms at VL/IT band with squats, improves with ITB stretch. Patient will continue to benefit from physical therapy in order to improve function and reduce impairment.    OBJECTIVE IMPAIRMENTS decreased ROM, decreased strength, hypomobility, increased fascial restrictions, increased muscle spasms, impaired flexibility, improper body mechanics, postural dysfunction, and pain.    ACTIVITY LIMITATIONS carrying, lifting, sitting, stairs, locomotion level   PARTICIPATION LIMITATIONS: driving, shopping, community activity, occupation, and yard work   PERSONAL FACTORS Age, Time since onset of injury/illness/exacerbation, and 1 comorbidity:    are also affecting patient's functional outcome.        GOALS:   SHORT TERM GOALS: Target date: 04/02/2023     Pt will become independent with HEP in order to demonstrate synthesis of PT education.  Goal status: met   2.  Pt will be able to demonstrate at least 110 deg knee flexion  in order to demonstrate functional improvement in LE function for self-care and house hold duties.    Goal status: ongoing   3.  Pt will score at least 12 pt increase on FOTO to demonstrate functional improvement in [facility]II and pt perceived function.     Goal status: INITIAL       LONG TERM GOALS: Target date: 05/20/2023     Pt  will become independent with final HEP in order to demonstrate synthesis of PT education.    Goal status: ongoing   2.  Pt will be able to demonstrate/report ability to walk >30 mins without pain in order to demonstrate functional improvement and tolerance to exercise and community mobility.    Goal status: met   3.  Pt will be able to demonstrate full depth squat  in order to demonstrate functional improvement in LE function for self-care and house hold duties.  ongoing  4.  Pt will score >/= 60 on FOTO to demonstrate improvement in perceived L knee function.  Baseline:  Goal status: ongoing  5.  Pt will be able to demonstrate full L knee ROM in order to demonstrate functional improvement in LE function for self-care and house hold duties.  ongoing      PLAN: PT FREQUENCY: 1-2x/week Additional 3rd visit in the week of 8/19-8/24/24 for aquatics appt 6/week for 2 weeks &2-3/week following   PT DURATION: POC date   PLANNED INTERVENTIONS: Therapeutic exercises, Therapeutic activity, Neuromuscular re-education, Balance training, Gait training, Patient/Family education, Self Care, Joint mobilization, Joint manipulation, Orthotic/Fit training, Aquatic Therapy, Dry Needling, Electrical stimulation,  Spinal manipulation, Spinal mobilization, Cryotherapy, Moist heat, Compression bandaging, scar mobilization, Splintting, Taping, Vasopneumatic device, Traction, Ultrasound, Ionotophoresis 4mg /ml Dexamethasone, Manual therapy, and Re-evaluation   PLAN FOR NEXT SESSION: PROM into flexion, work back into there-ex as tolerated. Cupping/ DN   Reola Mosher Liyanna Cartwright, PT 04/14/2023, 8:44 AM

## 2023-04-15 ENCOUNTER — Encounter (HOSPITAL_BASED_OUTPATIENT_CLINIC_OR_DEPARTMENT_OTHER): Payer: Self-pay | Admitting: Physical Therapy

## 2023-04-15 ENCOUNTER — Ambulatory Visit: Payer: HMO | Admitting: Orthopaedic Surgery

## 2023-04-15 ENCOUNTER — Ambulatory Visit (HOSPITAL_BASED_OUTPATIENT_CLINIC_OR_DEPARTMENT_OTHER): Payer: HMO | Admitting: Physical Therapy

## 2023-04-15 DIAGNOSIS — M6281 Muscle weakness (generalized): Secondary | ICD-10-CM

## 2023-04-15 DIAGNOSIS — R262 Difficulty in walking, not elsewhere classified: Secondary | ICD-10-CM

## 2023-04-15 DIAGNOSIS — M25562 Pain in left knee: Secondary | ICD-10-CM

## 2023-04-15 NOTE — Therapy (Signed)
OUTPATIENT PHYSICAL THERAPY LOWER EXTREMITY TREATMENT   Patient Name: Ashley Blair MRN: [id number] DOB:1956/01/15, 67 y.o., female Today's Date: 04/15/2023  END OF SESSION:  PT End of Session - 04/15/23 1037     Visit Number 10    Number of Visits 16    Date for PT Re-Evaluation 05/29/23    Authorization Type HTA    PT Start Time 0930    PT Stop Time 1015    PT Time Calculation (min) 45 min    Activity Tolerance Patient tolerated treatment well    Behavior During Therapy WFL for tasks assessed/performed                 Past Medical History:  Diagnosis Date   Anxiety    Arthritis    Elevated hemoglobin A1c 2019   5.7   Family history of DES exposure    Fibrocystic breast    GERD (gastroesophageal reflux disease)    HPV (human papilloma virus) infection    Hypercholesterolemia    OCD (obsessive compulsive disorder)    Pre-diabetes    S/P excision of lipoma    Past Surgical History:  Procedure Laterality Date   ANKLE SURGERY     broken as a child   BREAST BIOPSY Right    COLONOSCOPY     DILATION AND CURETTAGE OF UTERUS     LIPOMA EXCISION     rib cage   TOTAL KNEE ARTHROPLASTY Left 02/02/2023   Procedure: LEFT TOTAL KNEE ARTHROPLASTY;  Surgeon: Tarry Kos, MD;  Location: [facility] OR;  Service: Orthopedics;  Laterality: Left;   TRIGGER FINGER RELEASE Left 03/2013   thumb   Patient Active Problem List   Diagnosis Date Noted   Status post total left knee replacement 02/02/2023   Primary osteoarthritis of left knee 02/01/2023   Family history of breast cancer in mother 12/12/2013   H/O diethylstilbestrol (DES) exposure in utero 12/12/2013    PCP:  Thana Ates, MD       REFERRING PROVIDER:  Tarry Kos, MD     REFERRING DIAG:  [phone] (ICD-10-CM) - Primary osteoarthritis of left knee      THERAPY DIAG:  Difficulty walking  Acute pain of left knee  Muscle weakness (generalized)  Rationale for Evaluation and Treatment:  Rehabilitation  ONSET DATE: 02/02/23 DOS Manipulation 8/29    Days since surgery: 53   Operative procedure: Left total knee arthroplasty.   SUBJECTIVE:   SUBJECTIVE STATEMENT: Pt states that the knee feels stiff. The ITB pain is stopping her from being to continue.    PERTINENT HISTORY: N/A PAIN:  Are you having pain? No NPRS scale: 2-3/10 Pain location: posterior knee anterior knee  Pain description: "tight band feeling" Aggravating factors: bending Relieving factors: N/A, ice/rest   PRECAUTIONS: Knee  RED FLAGS: None   WEIGHT BEARING RESTRICTIONS: No  FALLS:  Has patient fallen in last 6 months? No  LIVING ENVIRONMENT: Lives with: lives with their family and lives with their spouse Lives in: House/apartment Stairs: Yes Has following equipment at home: Single point cane  OCCUPATION: Marketing executive; Office manager  PLOF: Independent  PATIENT GOALS: return to normal exercise    OBJECTIVE:   DIAGNOSTIC FINDINGS: N/A  PATIENT SURVEYS:  FOTO 39 60 @ DC 12 MCII   COGNITION: Overall cognitive status: Within functional limits for tasks assessed     SENSATION: WFL  EDEMA:  Moderate, palpable edema anteriorly and posteriorly into popliteal fossa  LOWER EXTREMITY ROM:  Active  ROM Right eval Left eval L 7/20 L 7/24 Lt 8/5 Lt  8/5 8/19 8/30 9/3 04/08/23 04/10/23 04/14/23 9/11  Hip flexion               Hip extension               Hip abduction               Hip adduction               Hip internal rotation               Hip external rotation               Knee flexion 125 75 78 86 95 91 101 CKC  107 105 90 at beginning of session, improves to 110 90 Improves to  117 92 at beginning of session, improves to 115 90  116 at end of session  Knee extension 3 -12 -5   1 0 0       Ankle dorsiflexion               Ankle plantarflexion               Ankle inversion               Ankle eversion                (Blank rows = not tested)   MMT 4+/5 on  L   TODAY'S TREATMENT:  9/11  Knee flexion joint mob with distraction and tibial IR/ER bias grade IV STM L adductor and biceps fem  Knee to opp shoulder stretch 30s 3x LAQ 5lbs 2 hold at top, AAROM flexion stretch 5s at bottom 2x10 Lateral lunge with UE support at table bilat 2x10 [place]/feet together squat 15x with table support and heel prop  Edu regarding self scraping/IASTM   04/14/23 Patient TTP R vastus lateralis, biceps femoris Supine heel slides with strap 10 x 10 second holds improves from 90 to 102 Contract relax in prone 2x 10 x 5 second holds improves to 112 Manual scar mobilizations  and education for self performance 10 minutes  Contract relax in prone 1x 10 x 5 second holds improves to 115 Deep squat at counter 2 x 10  Standing IT band stretch 3 x 20 second holds Lunge LLE leading 2 x 10  Hamstring curl machine 25# 1 x 10     9/9  Trigger Point Dry-Needling  Treatment instructions: Expect mild to moderate muscle soreness. S/S of pneumothorax if dry needled over a lung field, and to seek immediate medical attention should they occur. Patient verbalized understanding of these instructions and education.   Patient Consent Given: Yes Education (verbally/handout)provided: Yes Muscles treated: L VL, L lateral gastroc, L biceps femoris Electrical stimulation performed: no Parameters:   N/A Treatment response/outcome: soft tissue extensibility improvement. LTR elicited   STM to mm muscles treated Skin rolling    Contract relax in supine 2x10 5s holds  Deep squat position at rail (painful) Up2 down 1 SL eccentric on shuttle 50lbs 2x10  04/11/23 Patient TTP R vastus lateralis, biceps femoris Supine heel slides with strap 10 x 10 second holds improves from 95 to 116 Contract relax in short sitting 2x 5 x 5 second holds  Active walking around clinic with focus on knee flexion.  Heel taps at stairs to focus on quad control with descending stairs. Deep squat at  counter 1 x 10  04/10/23 Patient TTP R vastus lateralis, biceps femoris Supine heel slides with strap 10 x 10 second holds improves from 90 to 100 Contract relax in prone 2x 10 x 5 second holds improves to 108 Quadruped  rocking x 2  with 30 second hold improves to 111 Self STM to VM with theracane Manual scar mobilizations  and education for self performance 10 minutes - improves to 117 following  Deep squat at counter 1 x 10    Treatment                            9/5:  Trigger Point Dry Needling, Manual Therapy Treatment:  Initial or subsequent education regarding Trigger Point Dry Needling: Subsequent Did patient give consent to treatment with Trigger Point Dry Needling: Yes TPDN with skilled palpation and monitoring followed by STM to the following muscles: biceps femoris & VL  STM & myofascial rolling along VL and across distal knee Reviewed seated stretch Lunge stretch with AP tibial mobs- edu husband on use of sheet at home Deep squat at sink for pulloff.   04/08/23 Supine heel slides with strap 10 x 10 second holds improves from 90 to 99 Supine hamstring isometrics into green ball 5 x 10 second holds Contract relax in seated 10 x 5 second holds improves to 107 Contract relax in prone 2x 10 x 5 second holds improves to 110 Seated on stair progressive flexion stretch 5 minutes Shuttle 75, 4 x 10 with progressive knee flexion 110 following    9/3: PROM L knee seated and supine with overpressure.  STM to medial and lateral quads, posterior knee Patella mobilizations tib/fem mobilizations  Sci-fit bike 5 min full revs Prone knee flexion x10 Stair climbing 1/2 flight reciprocally up/down- single rail up, bil rail down   08/31 Manual: Manual: PROM into flexion with OP; Grade II and III PA and AP mobilization; Short sitting PROM into flexion with contract/ relax at end range with 5 sec hold. STM to medial and lateral quad; reviewed all techniques with husband per request to  be performed this weekend.   LAQ with blue theraband resistance  Squats to chair with cues to squeeze glutes  Quad sets   8/30 Manual: PROM into flexion; Grade II and III PA and AP mobilization; STM to medial and lateral quad; Edema massage   Reviewed self stretch for home using strap 5x 5 sec hold    Treatment                            8/28:  See post op HEP   Treatment                            8/26:  Bike 6 min half revolutions Discussion of manipulation and PT schedule/frequency STM and IASTM to tib anterior and distal lateral HS PROM L knee LAQ 3# 5" hold x20   Treatment                            8/22: Pt seen for aquatic therapy today.  Treatment took place in water 3.5-4.75 ft in depth at the [facility] pool. Temp of water was 91.  Pt entered/exited the pool via stairs using step to pattern with hand rail rail.  *walking forward, back with cues for heel strike *  quad and hip flex stretch suing noodle *3 way stretch using solid noodle: hamstring, gastroc, adductor and IT band *STS from 3rd then 4th step from bottom Noodle kick down yellow hip in neutral position then externally rotation 10 slow then 10 fast ea position *cycling on noodle x 6 widths; hip add/abd; hip flex/ext *Quad stretch on 2nd step 5 pulses then static hold x 5 *Step up onto 2nd step cues for eccentric lowering and controlled left knee extension     Pt requires the buoyancy and hydrostatic pressure of water for support, and to offload joints by unweighting joint load by at least 50 % in navel deep water and by at least 75-80% in chest to neck deep water.  Viscosity of the water is needed for resistance of strengthening. Water current perturbations provides challenge to standing balance requiring increased core activation.      PATIENT EDUCATION:  Education details:  anatomy, exercise progression, DOMS expectations, muscle firing,  envelope of function, HEP, POC 04/10/23: hep scar  mobilizations  Person educated: Patient Education method: Explanation, Demonstration, Tactile cues, Verbal cues, and Handouts Education comprehension: verbalized understanding, returned demonstration, verbal cues required, tactile cues required, and needs further education     HOME EXERCISE PROGRAM:  Access Code: [email or web address] URL: [email or web address] Post op program   ASSESSMENT:   CLINICAL IMPRESSION: Pt improves L knee flexion today at end of session to 117. Pt able to maintain ext to 0 deg. Pt does respond well to combined manual therapy with CKC loaded flexion. Posterior knee pain and anterior scar tightness continue to limit knee flexion. Pt advised to incorporate adductor and hip stretching type motions as well to improve soft tissue extensibility along kinetic chain. Pt is progressing well with rehab, though at a slow and steady rate. Plan to continue with manual PRN and progressive flexion as tolerated. Pt would benefit from continued skilled therapy in order to reach goals and maximize functional L E strength and ROM for full return to PLOF.     OBJECTIVE IMPAIRMENTS decreased ROM, decreased strength, hypomobility, increased fascial restrictions, increased muscle spasms, impaired flexibility, improper body mechanics, postural dysfunction, and pain.    ACTIVITY LIMITATIONS carrying, lifting, sitting, stairs, locomotion level   PARTICIPATION LIMITATIONS: driving, shopping, community activity, occupation, and yard work   PERSONAL FACTORS Age, Time since onset of injury/illness/exacerbation, and 1 comorbidity:    are also affecting patient's functional outcome.        GOALS:   SHORT TERM GOALS: Target date: 04/02/2023     Pt will become independent with HEP in order to demonstrate synthesis of PT education.  Goal status: met   2.  Pt will be able to demonstrate at least 110 deg knee flexion  in order to demonstrate functional improvement in LE function  for self-care and house hold duties.    Goal status: MET   3.  Pt will score at least 12 pt increase on FOTO to demonstrate functional improvement in MCII and pt perceived function.     Goal status: INITIAL       LONG TERM GOALS: Target date: 05/20/2023     Pt  will become independent with final HEP in order to demonstrate synthesis of PT education.    Goal status: ongoing   2.  Pt will be able to demonstrate/report ability to walk >30 mins without pain in order to demonstrate functional improvement and tolerance to exercise and community mobility.    Goal status: met   3.  Pt will be able to demonstrate full depth squat in order to demonstrate functional improvement in LE function for self-care and house hold duties.  ongoing  4.  Pt will score >/= 60 on FOTO to demonstrate improvement in perceived L knee function.  Baseline:  Goal status: ongoing  5.  Pt will be able to demonstrate full L knee ROM in order to demonstrate functional improvement in LE function for self-care and house hold duties.  ongoing      PLAN: PT FREQUENCY: 1-2x/week Additional 3rd visit in the week of 8/19-8/24/24 for aquatics appt 6/week for 2 weeks &2-3/week following   PT DURATION: POC date   PLANNED INTERVENTIONS: Therapeutic exercises, Therapeutic activity, Neuromuscular re-education, Balance training, Gait training, Patient/Family education, Self Care, Joint mobilization, Joint manipulation, Orthotic/Fit training, Aquatic Therapy, Dry Needling, Electrical stimulation, Spinal manipulation, Spinal mobilization, Cryotherapy, Moist heat, Compression bandaging, scar mobilization, Splintting, Taping, Vasopneumatic device, Traction, Ultrasound, Ionotophoresis 4mg /ml Dexamethasone, Manual therapy, and Re-evaluation   PLAN FOR NEXT SESSION: PROM into flexion, work back into there-ex as tolerated. Cupping/ DN   Zebedee Iba, PT 04/15/2023, 10:43 AM

## 2023-04-16 ENCOUNTER — Encounter (HOSPITAL_BASED_OUTPATIENT_CLINIC_OR_DEPARTMENT_OTHER): Payer: Self-pay

## 2023-04-16 ENCOUNTER — Ambulatory Visit (HOSPITAL_BASED_OUTPATIENT_CLINIC_OR_DEPARTMENT_OTHER): Payer: HMO

## 2023-04-16 DIAGNOSIS — M6281 Muscle weakness (generalized): Secondary | ICD-10-CM

## 2023-04-16 DIAGNOSIS — R262 Difficulty in walking, not elsewhere classified: Secondary | ICD-10-CM | POA: Diagnosis not present

## 2023-04-16 DIAGNOSIS — M25562 Pain in left knee: Secondary | ICD-10-CM

## 2023-04-16 NOTE — Therapy (Signed)
OUTPATIENT PHYSICAL THERAPY LOWER EXTREMITY TREATMENT   Patient Name: Ashley Blair MRN: [id number] DOB:1956-04-24, 67 y.o., female Today's Date: 04/16/2023  END OF SESSION:  PT End of Session - 04/16/23 0924     Visit Number 11    Number of Visits 16    Date for PT Re-Evaluation 05/29/23    Authorization Type HTA    PT Start Time 0847    PT Stop Time 0930    PT Time Calculation (min) 43 min    Activity Tolerance Patient tolerated treatment well    Behavior During Therapy [facility] for tasks assessed/performed                  Past Medical History:  Diagnosis Date   Anxiety    Arthritis    Elevated hemoglobin A1c 2019   5.7   Family history of DES exposure    Fibrocystic breast    GERD (gastroesophageal reflux disease)    HPV (human papilloma virus) infection    Hypercholesterolemia    OCD (obsessive compulsive disorder)    Pre-diabetes    S/P excision of lipoma    Past Surgical History:  Procedure Laterality Date   ANKLE SURGERY     broken as a child   BREAST BIOPSY Right    COLONOSCOPY     DILATION AND CURETTAGE OF UTERUS     LIPOMA EXCISION     rib cage   TOTAL KNEE ARTHROPLASTY Left 02/02/2023   Procedure: LEFT TOTAL KNEE ARTHROPLASTY;  Surgeon: Tarry Kos, MD;  Location: [facility] OR;  Service: Orthopedics;  Laterality: Left;   TRIGGER FINGER RELEASE Left 03/2013   thumb   Patient Active Problem List   Diagnosis Date Noted   Status post total left knee replacement 02/02/2023   Primary osteoarthritis of left knee 02/01/2023   Family history of breast cancer in mother 12/12/2013   H/O diethylstilbestrol (DES) exposure in utero 12/12/2013    PCP:  Thana Ates, MD       REFERRING PROVIDER:  Tarry Kos, MD     REFERRING DIAG:  [id number] (ICD-10-CM) - Primary osteoarthritis of left knee      THERAPY DIAG:  Difficulty walking  Acute pain of left knee  Muscle weakness (generalized)  Rationale for Evaluation and Treatment:  Rehabilitation  ONSET DATE: 02/02/23 DOS Manipulation 8/29    Days since surgery: 53   Operative procedure: Left total knee arthroplasty.   SUBJECTIVE:   SUBJECTIVE STATEMENT: Pt reports stiffness this morning. She is compliant with HEP and tries to use exercise bike at home for 20 mins at a time. Feels improvement since MUA.   PERTINENT HISTORY: N/A PAIN:  Are you having pain? No NPRS scale: 2-3/10 Pain location: posterior knee anterior knee  Pain description: "tight band feeling" Aggravating factors: bending Relieving factors: N/A, ice/rest   PRECAUTIONS: Knee  RED FLAGS: None   WEIGHT BEARING RESTRICTIONS: No  FALLS:  Has patient fallen in last 6 months? No  LIVING ENVIRONMENT: Lives with: lives with their family and lives with their spouse Lives in: House/apartment Stairs: Yes Has following equipment at home: Single point cane  OCCUPATION: Marketing executive; Office manager  PLOF: Independent  PATIENT GOALS: return to normal exercise    OBJECTIVE:   DIAGNOSTIC FINDINGS: N/A  PATIENT SURVEYS:  FOTO 39 60 @ DC 12 MCII   COGNITION: Overall cognitive status: Within functional limits for tasks assessed     SENSATION: WFL  EDEMA:  Moderate, palpable edema anteriorly  and posteriorly into popliteal fossa  LOWER EXTREMITY ROM:  Active ROM Right eval Left eval L 7/20 L 7/24 Lt 8/5 Lt  8/5 8/19 8/30 9/3 04/08/23 04/10/23 04/14/23 9/11  Hip flexion               Hip extension               Hip abduction               Hip adduction               Hip internal rotation               Hip external rotation               Knee flexion 125 75 78 86 95 91 101 CKC  107 105 90 at beginning of session, improves to 110 90 Improves to  117 92 at beginning of session, improves to 115 90  116 at end of session  Knee extension 3 -12 -5   1 0 0       Ankle dorsiflexion               Ankle plantarflexion               Ankle inversion               Ankle  eversion                (Blank rows = not tested)   MMT 4+/5 on L   TODAY'S TREATMENT:    9/12 PROM L knee Sci-fit bike for ROM L tib/fem jt mobilizations with distraction and tibial ER/IR bias STM to medial/lateral/posterior knee LAQ 5lbs 2 hold at top, AAROM flexion stretch 5s at bottom 2x10    9/11  Knee flexion joint mob with distraction and tibial IR/ER bias grade IV STM L adductor and biceps fem  Knee to opp shoulder stretch 30s 3x LAQ 5lbs 2 hold at top, AAROM flexion stretch 5s at bottom 2x10 Lateral lunge with UE support at table bilat 2x10 [place]/feet together squat 15x with table support and heel prop  Edu regarding self scraping/IASTM   04/14/23 Patient TTP R vastus lateralis, biceps femoris Supine heel slides with strap 10 x 10 second holds improves from 90 to 102 Contract relax in prone 2x 10 x 5 second holds improves to 112 Manual scar mobilizations  and education for self performance 10 minutes  Contract relax in prone 1x 10 x 5 second holds improves to 115 Deep squat at counter 2 x 10  Standing IT band stretch 3 x 20 second holds Lunge LLE leading 2 x 10  Hamstring curl machine 25# 1 x 10       PATIENT EDUCATION:  Education details:  anatomy, exercise progression, DOMS expectations, muscle firing,  envelope of function, HEP, POC 04/10/23: hep scar mobilizations  Person educated: Patient Education method: Explanation, Demonstration, Tactile cues, Verbal cues, and Handouts Education comprehension: verbalized understanding, returned demonstration, verbal cues required, tactile cues required, and needs further education     HOME EXERCISE PROGRAM:  Access Code: [email or web address] URL: [email or web address] Post op program   ASSESSMENT:   CLINICAL IMPRESSION:  Pt measured at 113deg today. She was 117deg yesterday. Very tender to palpation of medial and lateral knee. With end range knee flexion, she feels discomfort in  posterior knee. Spent time on MT to these areas and instructed pt in self MT as well.  Will continue to work on improving ROM and maintaining functional range.     OBJECTIVE IMPAIRMENTS decreased ROM, decreased strength, hypomobility, increased fascial restrictions, increased muscle spasms, impaired flexibility, improper body mechanics, postural dysfunction, and pain.    ACTIVITY LIMITATIONS carrying, lifting, sitting, stairs, locomotion level   PARTICIPATION LIMITATIONS: driving, shopping, community activity, occupation, and yard work   PERSONAL FACTORS Age, Time since onset of injury/illness/exacerbation, and 1 comorbidity:    are also affecting patient's functional outcome.        GOALS:   SHORT TERM GOALS: Target date: 04/02/2023     Pt will become independent with HEP in order to demonstrate synthesis of PT education.  Goal status: met   2.  Pt will be able to demonstrate at least 110 deg knee flexion  in order to demonstrate functional improvement in LE function for self-care and house hold duties.    Goal status: MET   3.  Pt will score at least 12 pt increase on FOTO to demonstrate functional improvement in MCII and pt perceived function.     Goal status: INITIAL       LONG TERM GOALS: Target date: 05/20/2023     Pt  will become independent with final HEP in order to demonstrate synthesis of PT education.    Goal status: ongoing   2.  Pt will be able to demonstrate/report ability to walk >30 mins without pain in order to demonstrate functional improvement and tolerance to exercise and community mobility.    Goal status: met   3.  Pt will be able to demonstrate full depth squat in order to demonstrate functional improvement in LE function for self-care and house hold duties.  ongoing  4.  Pt will score >/= 60 on FOTO to demonstrate improvement in perceived L knee function.  Baseline:  Goal status: ongoing  5.  Pt will be able to demonstrate full L knee ROM in  order to demonstrate functional improvement in LE function for self-care and house hold duties.  ongoing      PLAN: PT FREQUENCY: 1-2x/week Additional 3rd visit in the week of 8/19-8/24/24 for aquatics appt 6/week for 2 weeks &2-3/week following   PT DURATION: POC date   PLANNED INTERVENTIONS: Therapeutic exercises, Therapeutic activity, Neuromuscular re-education, Balance training, Gait training, Patient/Family education, Self Care, Joint mobilization, Joint manipulation, Orthotic/Fit training, Aquatic Therapy, Dry Needling, Electrical stimulation, Spinal manipulation, Spinal mobilization, Cryotherapy, Moist heat, Compression bandaging, scar mobilization, Splintting, Taping, Vasopneumatic device, Traction, Ultrasound, Ionotophoresis 4mg /ml Dexamethasone, Manual therapy, and Re-evaluation   PLAN FOR NEXT SESSION: PROM into flexion, work back into there-ex as tolerated. Cupping/ DN   Donnel Saxon Damarea Merkel, PTA 04/16/2023, 9:49 AM

## 2023-04-17 ENCOUNTER — Ambulatory Visit (HOSPITAL_BASED_OUTPATIENT_CLINIC_OR_DEPARTMENT_OTHER): Payer: HMO | Admitting: Physical Therapy

## 2023-04-17 ENCOUNTER — Encounter (HOSPITAL_BASED_OUTPATIENT_CLINIC_OR_DEPARTMENT_OTHER): Payer: Self-pay | Admitting: Physical Therapy

## 2023-04-17 DIAGNOSIS — M6281 Muscle weakness (generalized): Secondary | ICD-10-CM

## 2023-04-17 DIAGNOSIS — R262 Difficulty in walking, not elsewhere classified: Secondary | ICD-10-CM

## 2023-04-17 DIAGNOSIS — Z1231 Encounter for screening mammogram for malignant neoplasm of breast: Secondary | ICD-10-CM | POA: Diagnosis not present

## 2023-04-17 DIAGNOSIS — M25562 Pain in left knee: Secondary | ICD-10-CM

## 2023-04-17 NOTE — Therapy (Signed)
OUTPATIENT PHYSICAL THERAPY LOWER EXTREMITY TREATMENT   Patient Name: Ashley Blair MRN: [id number] DOB:Jul 19, 1956, 67 y.o., female Today's Date: 04/17/2023  END OF SESSION:  PT End of Session - 04/17/23 0840     Visit Number 12    Number of Visits 16    Date for PT Re-Evaluation 05/29/23    Authorization Type HTA    PT Start Time 0845    PT Stop Time 0925    PT Time Calculation (min) 40 min    Activity Tolerance Patient tolerated treatment well    Behavior During Therapy [facility] for tasks assessed/performed                  Past Medical History:  Diagnosis Date   Anxiety    Arthritis    Elevated hemoglobin A1c 2019   5.7   Family history of DES exposure    Fibrocystic breast    GERD (gastroesophageal reflux disease)    HPV (human papilloma virus) infection    Hypercholesterolemia    OCD (obsessive compulsive disorder)    Pre-diabetes    S/P excision of lipoma    Past Surgical History:  Procedure Laterality Date   ANKLE SURGERY     broken as a child   BREAST BIOPSY Right    COLONOSCOPY     DILATION AND CURETTAGE OF UTERUS     LIPOMA EXCISION     rib cage   TOTAL KNEE ARTHROPLASTY Left 02/02/2023   Procedure: LEFT TOTAL KNEE ARTHROPLASTY;  Surgeon: Tarry Kos, MD;  Location: [facility] OR;  Service: Orthopedics;  Laterality: Left;   TRIGGER FINGER RELEASE Left 03/2013   thumb   Patient Active Problem List   Diagnosis Date Noted   Status post total left knee replacement 02/02/2023   Primary osteoarthritis of left knee 02/01/2023   Family history of breast cancer in mother 12/12/2013   H/O diethylstilbestrol (DES) exposure in utero 12/12/2013    PCP:  Thana Ates, MD       REFERRING PROVIDER:  Tarry Kos, MD     REFERRING DIAG:  [phone] (ICD-10-CM) - Primary osteoarthritis of left knee      THERAPY DIAG:  Difficulty walking  Acute pain of left knee  Muscle weakness (generalized)  Rationale for Evaluation and Treatment:  Rehabilitation  ONSET DATE: 02/02/23 DOS Manipulation 8/29    Days since surgery: 53   Operative procedure: Left total knee arthroplasty.   SUBJECTIVE:   SUBJECTIVE STATEMENT: Pt reports knee is stiff this morning. Doing HEP.    PERTINENT HISTORY: N/A PAIN:  Are you having pain? No NPRS scale: 2-3/10 Pain location: posterior knee anterior knee  Pain description: "tight band feeling" Aggravating factors: bending Relieving factors: N/A, ice/rest   PRECAUTIONS: Knee  RED FLAGS: None   WEIGHT BEARING RESTRICTIONS: No  FALLS:  Has patient fallen in last 6 months? No  LIVING ENVIRONMENT: Lives with: lives with their family and lives with their spouse Lives in: House/apartment Stairs: Yes Has following equipment at home: Single point cane  OCCUPATION: Marketing executive; Office manager  PLOF: Independent  PATIENT GOALS: return to normal exercise    OBJECTIVE:   DIAGNOSTIC FINDINGS: N/A  PATIENT SURVEYS:  FOTO 39 60 @ DC 12 MCII   COGNITION: Overall cognitive status: Within functional limits for tasks assessed     SENSATION: WFL  EDEMA:  Moderate, palpable edema anteriorly and posteriorly into popliteal fossa  LOWER EXTREMITY ROM:  Active ROM Right eval Left eval L 7/20  L 7/24 Lt 8/5 Lt  8/5 8/19 8/30 9/3 04/08/23 04/10/23 04/14/23 9/11 04/17/23  Hip flexion                Hip extension                Hip abduction                Hip adduction                Hip internal rotation                Hip external rotation                Knee flexion 125 75 78 86 95 91 101 CKC  107 105 90 at beginning of session, improves to 110 90 Improves to  117 92 at beginning of session, improves to 115 90  116 at end of session 101  Knee extension 3 -12 -5   1 0 0        Ankle dorsiflexion                Ankle plantarflexion                Ankle inversion                Ankle eversion                 (Blank rows = not tested)   MMT 4+/5 on L   TODAY'S  TREATMENT:   04/17/23 Contract relax in prone 2x 10 x 5 second holds improves to 111 Standing IT band stretch 3 x 20 second holds Deep squat at counter 2 x 10   Manual: STM to L vastus lateralis pre and post dry needling for trigger point identification and muscular relaxation. Trigger Point Dry-Needling  Treatment instructions: Expect mild to moderate muscle soreness. S/S of pneumothorax if dry needled over a lung field, and to seek immediate medical attention should they occur. Patient verbalized understanding of these instructions and education.  Patient Consent Given: Yes Education handout provided: Previously provided Muscles treated: L vastus lateralis Electrical stimulation performed: No Parameters: N/A Treatment response/outcome: decrease in tissue tension Lateral lunge with UE support at table bilat 2x10 [place]/feet together squat 2x 5 with table support and heel prop Split squat with RLE on chair 1 x 10    9/12 PROM L knee Sci-fit bike for ROM L tib/fem jt mobilizations with distraction and tibial ER/IR bias STM to medial/lateral/posterior knee LAQ 5lbs 2 hold at top, AAROM flexion stretch 5s at bottom 2x10    9/11  Knee flexion joint mob with distraction and tibial IR/ER bias grade IV STM L adductor and biceps fem  Knee to opp shoulder stretch 30s 3x LAQ 5lbs 2 hold at top, AAROM flexion stretch 5s at bottom 2x10 Lateral lunge with UE support at table bilat 2x10 [place]/feet together squat 15x with table support and heel prop  Edu regarding self scraping/IASTM   04/14/23 Patient TTP R vastus lateralis, biceps femoris Supine heel slides with strap 10 x 10 second holds improves from 90 to 102 Contract relax in prone 2x 10 x 5 second holds improves to 112 Manual scar mobilizations  and education for self performance 10 minutes  Contract relax in prone 1x 10 x 5 second holds improves to 115 Deep squat at counter 2 x 10  Standing IT band stretch 3 x  20 second holds  Lunge LLE leading 2 x 10  Hamstring curl machine 25# 1 x 10       PATIENT EDUCATION:  Education details:  anatomy, exercise progression, DOMS expectations, muscle firing,  envelope of function, HEP, POC 04/10/23: hep scar mobilizations  Person educated: Patient Education method: Explanation, Demonstration, Tactile cues, Verbal cues, and Handouts Education comprehension: verbalized understanding, returned demonstration, verbal cues required, tactile cues required, and needs further education     HOME EXERCISE PROGRAM:  Access Code: [email or web address] URL: [email or web address] Post op program   ASSESSMENT:   CLINICAL IMPRESSION:  Patient with ROM to 111. Continued tenderness and trigger points in quad. Performed STM and DN to vastus lateralis with improvement in symptoms and squat performance following. Continued with strengthening into progressive flexion. Patient will continue to benefit from physical therapy in order to improve function and reduce impairment.     OBJECTIVE IMPAIRMENTS decreased ROM, decreased strength, hypomobility, increased fascial restrictions, increased muscle spasms, impaired flexibility, improper body mechanics, postural dysfunction, and pain.    ACTIVITY LIMITATIONS carrying, lifting, sitting, stairs, locomotion level   PARTICIPATION LIMITATIONS: driving, shopping, community activity, occupation, and yard work   PERSONAL FACTORS Age, Time since onset of injury/illness/exacerbation, and 1 comorbidity:    are also affecting patient's functional outcome.        GOALS:   SHORT TERM GOALS: Target date: 04/02/2023     Pt will become independent with HEP in order to demonstrate synthesis of PT education.  Goal status: met   2.  Pt will be able to demonstrate at least 110 deg knee flexion  in order to demonstrate functional improvement in LE function for self-care and house hold duties.    Goal status: MET   3.  Pt will  score at least 12 pt increase on FOTO to demonstrate functional improvement in MCII and pt perceived function.     Goal status: INITIAL       LONG TERM GOALS: Target date: 05/20/2023     Pt  will become independent with final HEP in order to demonstrate synthesis of PT education.    Goal status: ongoing   2.  Pt will be able to demonstrate/report ability to walk >30 mins without pain in order to demonstrate functional improvement and tolerance to exercise and community mobility.    Goal status: met   3.  Pt will be able to demonstrate full depth squat in order to demonstrate functional improvement in LE function for self-care and house hold duties.  ongoing  4.  Pt will score >/= 60 on FOTO to demonstrate improvement in perceived L knee function.  Baseline:  Goal status: ongoing  5.  Pt will be able to demonstrate full L knee ROM in order to demonstrate functional improvement in LE function for self-care and house hold duties.  ongoing      PLAN: PT FREQUENCY: 1-2x/week Additional 3rd visit in the week of 8/19-8/24/24 for aquatics appt 6/week for 2 weeks &2-3/week following   PT DURATION: POC date   PLANNED INTERVENTIONS: Therapeutic exercises, Therapeutic activity, Neuromuscular re-education, Balance training, Gait training, Patient/Family education, Self Care, Joint mobilization, Joint manipulation, Orthotic/Fit training, Aquatic Therapy, Dry Needling, Electrical stimulation, Spinal manipulation, Spinal mobilization, Cryotherapy, Moist heat, Compression bandaging, scar mobilization, Splintting, Taping, Vasopneumatic device, Traction, Ultrasound, Ionotophoresis 4mg /ml Dexamethasone, Manual therapy, and Re-evaluation   PLAN FOR NEXT SESSION: PROM into flexion, work back into there-ex as tolerated. Cupping/ DN   Reola Mosher Ason Heslin, PT 04/17/2023, 8:41 AM

## 2023-04-18 ENCOUNTER — Encounter (HOSPITAL_BASED_OUTPATIENT_CLINIC_OR_DEPARTMENT_OTHER): Payer: Self-pay | Admitting: Physical Therapy

## 2023-04-18 ENCOUNTER — Ambulatory Visit (HOSPITAL_BASED_OUTPATIENT_CLINIC_OR_DEPARTMENT_OTHER): Payer: HMO | Admitting: Physical Therapy

## 2023-04-18 DIAGNOSIS — M6281 Muscle weakness (generalized): Secondary | ICD-10-CM

## 2023-04-18 DIAGNOSIS — R262 Difficulty in walking, not elsewhere classified: Secondary | ICD-10-CM

## 2023-04-18 DIAGNOSIS — M25562 Pain in left knee: Secondary | ICD-10-CM

## 2023-04-18 NOTE — Therapy (Signed)
OUTPATIENT PHYSICAL THERAPY LOWER EXTREMITY TREATMENT   Patient Name: Ashley Blair MRN: [id number] DOB:08/11/1955, 67 y.o., female Today's Date: 04/18/2023  END OF SESSION:  PT End of Session - 04/18/23 0829     Visit Number 13    Number of Visits 16    Date for PT Re-Evaluation 05/29/23    Authorization Type HTA    PT Start Time 0830    PT Stop Time 0910    PT Time Calculation (min) 40 min    Activity Tolerance Patient tolerated treatment well    Behavior During Therapy [facility] for tasks assessed/performed                  Past Medical History:  Diagnosis Date   Anxiety    Arthritis    Elevated hemoglobin A1c 2019   5.7   Family history of DES exposure    Fibrocystic breast    GERD (gastroesophageal reflux disease)    HPV (human papilloma virus) infection    Hypercholesterolemia    OCD (obsessive compulsive disorder)    Pre-diabetes    S/P excision of lipoma    Past Surgical History:  Procedure Laterality Date   ANKLE SURGERY     broken as a child   BREAST BIOPSY Right    COLONOSCOPY     DILATION AND CURETTAGE OF UTERUS     LIPOMA EXCISION     rib cage   TOTAL KNEE ARTHROPLASTY Left 02/02/2023   Procedure: LEFT TOTAL KNEE ARTHROPLASTY;  Surgeon: Tarry Kos, MD;  Location: [facility] OR;  Service: Orthopedics;  Laterality: Left;   TRIGGER FINGER RELEASE Left 03/2013   thumb   Patient Active Problem List   Diagnosis Date Noted   Status post total left knee replacement 02/02/2023   Primary osteoarthritis of left knee 02/01/2023   Family history of breast cancer in mother 12/12/2013   H/O diethylstilbestrol (DES) exposure in utero 12/12/2013    PCP:  Thana Ates, MD       REFERRING PROVIDER:  Tarry Kos, MD     REFERRING DIAG:  [phone] (ICD-10-CM) - Primary osteoarthritis of left knee      THERAPY DIAG:  Difficulty walking  Acute pain of left knee  Muscle weakness (generalized)  Rationale for Evaluation and Treatment:  Rehabilitation  ONSET DATE: 02/02/23 DOS Manipulation 8/29    Days since surgery: 53   Operative procedure: Left total knee arthroplasty.   SUBJECTIVE:   SUBJECTIVE STATEMENT: Pt reports feels that DN may have helped some but still tender.   PERTINENT HISTORY: N/A PAIN:  Are you having pain? No NPRS scale: 2-3/10 Pain location: posterior knee anterior knee  Pain description: "tight band feeling" Aggravating factors: bending Relieving factors: N/A, ice/rest   PRECAUTIONS: Knee  RED FLAGS: None   WEIGHT BEARING RESTRICTIONS: No  FALLS:  Has patient fallen in last 6 months? No  LIVING ENVIRONMENT: Lives with: lives with their family and lives with their spouse Lives in: House/apartment Stairs: Yes Has following equipment at home: Single point cane  OCCUPATION: Marketing executive; Office manager  PLOF: Independent  PATIENT GOALS: return to normal exercise    OBJECTIVE:   DIAGNOSTIC FINDINGS: N/A  PATIENT SURVEYS:  FOTO 39 60 @ DC 12 MCII   COGNITION: Overall cognitive status: Within functional limits for tasks assessed     SENSATION: WFL  EDEMA:  Moderate, palpable edema anteriorly and posteriorly into popliteal fossa  LOWER EXTREMITY ROM:  Active ROM Right eval Left eval  L 7/20 L 7/24 Lt 8/5 Lt  8/5 8/19 8/30 9/3 04/08/23 04/10/23 04/14/23 9/11 04/17/23 04/18/23  Hip flexion                 Hip extension                 Hip abduction                 Hip adduction                 Hip internal rotation                 Hip external rotation                 Knee flexion 125 75 78 86 95 91 101 CKC  107 105 90 at beginning of session, improves to 110 90 Improves to  117 92 at beginning of session, improves to 115 90  116 at end of session 101 102  116 at end of session  Knee extension 3 -12 -5   1 0 0         Ankle dorsiflexion                 Ankle plantarflexion                 Ankle inversion                 Ankle eversion                   (Blank rows = not tested)   MMT 4+/5 on L   TODAY'S TREATMENT:  04/18/23 Patellar mobs superior inferior - improves from 102 to 107 Mobilization with movement lunge with PA tibial glide with belt 109  following  Mobilizations grade III tibial AP glides in progressive flexion 111 following Knee extension machine 10# LLE only 3 x 10 Hamstring curl 10 # BLE down LLE up 1 x 10 Supine hamstring isometrics into red ball 10 x 10 second holds Manual fibular head AP/PA glides; STM to hamstrings, popliteal fossa region - 116 following   04/17/23 Contract relax in prone 2x 10 x 5 second holds improves to 111 Standing IT band stretch 3 x 20 second holds Deep squat at counter 2 x 10   Manual: STM to L vastus lateralis pre and post dry needling for trigger point identification and muscular relaxation. Trigger Point Dry-Needling  Treatment instructions: Expect mild to moderate muscle soreness. S/S of pneumothorax if dry needled over a lung field, and to seek immediate medical attention should they occur. Patient verbalized understanding of these instructions and education.  Patient Consent Given: Yes Education handout provided: Previously provided Muscles treated: L vastus lateralis Electrical stimulation performed: No Parameters: N/A Treatment response/outcome: decrease in tissue tension Lateral lunge with UE support at table bilat 2x10 [place]/feet together squat 2x 5 with table support and heel prop Split squat with RLE on chair 1 x 10    9/12 PROM L knee Sci-fit bike for ROM L tib/fem jt mobilizations with distraction and tibial ER/IR bias STM to medial/lateral/posterior knee LAQ 5lbs 2 hold at top, AAROM flexion stretch 5s at bottom 2x10    9/11  Knee flexion joint mob with distraction and tibial IR/ER bias grade IV STM L adductor and biceps fem  Knee to opp shoulder stretch 30s 3x LAQ 5lbs 2 hold at top, AAROM flexion stretch 5s at bottom 2x10 Lateral lunge  with UE  support at table bilat 2x10 [place]/feet together squat 15x with table support and heel prop  Edu regarding self scraping/IASTM   04/14/23 Patient TTP R vastus lateralis, biceps femoris Supine heel slides with strap 10 x 10 second holds improves from 90 to 102 Contract relax in prone 2x 10 x 5 second holds improves to 112 Manual scar mobilizations  and education for self performance 10 minutes  Contract relax in prone 1x 10 x 5 second holds improves to 115 Deep squat at counter 2 x 10  Standing IT band stretch 3 x 20 second holds Lunge LLE leading 2 x 10  Hamstring curl machine 25# 1 x 10       PATIENT EDUCATION:  Education details:  anatomy, exercise progression, DOMS expectations, muscle firing,  envelope of function, HEP, POC 04/10/23: hep scar mobilizations  Person educated: Patient Education method: Explanation, Demonstration, Tactile cues, Verbal cues, and Handouts Education comprehension: verbalized understanding, returned demonstration, verbal cues required, tactile cues required, and needs further education     HOME EXERCISE PROGRAM:  Access Code: [email or web address] URL: [email or web address] Post op program   ASSESSMENT:   CLINICAL IMPRESSION:  Patient's ROM is improving in retention with 102 at beginning of session. Patient with ROM to 116 at end of session.  Continued with strengthening into progressive flexion and manual therapy. Patient will continue to benefit from physical therapy in order to improve function and reduce impairment.     OBJECTIVE IMPAIRMENTS decreased ROM, decreased strength, hypomobility, increased fascial restrictions, increased muscle spasms, impaired flexibility, improper body mechanics, postural dysfunction, and pain.    ACTIVITY LIMITATIONS carrying, lifting, sitting, stairs, locomotion level   PARTICIPATION LIMITATIONS: driving, shopping, community activity, occupation, and yard work   PERSONAL FACTORS Age, Time  since onset of injury/illness/exacerbation, and 1 comorbidity:    are also affecting patient's functional outcome.        GOALS:   SHORT TERM GOALS: Target date: 04/02/2023     Pt will become independent with HEP in order to demonstrate synthesis of PT education.  Goal status: met   2.  Pt will be able to demonstrate at least 110 deg knee flexion  in order to demonstrate functional improvement in LE function for self-care and house hold duties.    Goal status: MET   3.  Pt will score at least 12 pt increase on FOTO to demonstrate functional improvement in MCII and pt perceived function.     Goal status: INITIAL       LONG TERM GOALS: Target date: 05/20/2023     Pt  will become independent with final HEP in order to demonstrate synthesis of PT education.    Goal status: ongoing   2.  Pt will be able to demonstrate/report ability to walk >30 mins without pain in order to demonstrate functional improvement and tolerance to exercise and community mobility.    Goal status: met   3.  Pt will be able to demonstrate full depth squat in order to demonstrate functional improvement in LE function for self-care and house hold duties.  ongoing  4.  Pt will score >/= 60 on FOTO to demonstrate improvement in perceived L knee function.  Baseline:  Goal status: ongoing  5.  Pt will be able to demonstrate full L knee ROM in order to demonstrate functional improvement in LE function for self-care and house hold duties.  ongoing      PLAN: PT FREQUENCY: 1-2x/week Additional 3rd visit in the week  of 8/19-8/24/24 for aquatics appt 6/week for 2 weeks &2-3/week following   PT DURATION: POC date   PLANNED INTERVENTIONS: Therapeutic exercises, Therapeutic activity, Neuromuscular re-education, Balance training, Gait training, Patient/Family education, Self Care, Joint mobilization, Joint manipulation, Orthotic/Fit training, Aquatic Therapy, Dry Needling, Electrical stimulation, Spinal  manipulation, Spinal mobilization, Cryotherapy, Moist heat, Compression bandaging, scar mobilization, Splintting, Taping, Vasopneumatic device, Traction, Ultrasound, Ionotophoresis 4mg /ml Dexamethasone, Manual therapy, and Re-evaluation   PLAN FOR NEXT SESSION: PROM into flexion, work back into there-ex as tolerated. Cupping/ DN   Reola Mosher Thomasina Housley, PT 04/18/2023, 8:30 AM

## 2023-04-20 ENCOUNTER — Encounter (HOSPITAL_BASED_OUTPATIENT_CLINIC_OR_DEPARTMENT_OTHER): Payer: Self-pay

## 2023-04-20 ENCOUNTER — Encounter: Payer: Self-pay | Admitting: Obstetrics and Gynecology

## 2023-04-20 ENCOUNTER — Ambulatory Visit (HOSPITAL_BASED_OUTPATIENT_CLINIC_OR_DEPARTMENT_OTHER): Payer: HMO | Admitting: Physical Therapy

## 2023-04-21 ENCOUNTER — Encounter (HOSPITAL_BASED_OUTPATIENT_CLINIC_OR_DEPARTMENT_OTHER): Payer: Self-pay | Admitting: Physical Therapy

## 2023-04-21 ENCOUNTER — Ambulatory Visit (HOSPITAL_BASED_OUTPATIENT_CLINIC_OR_DEPARTMENT_OTHER): Payer: HMO | Admitting: Physical Therapy

## 2023-04-21 DIAGNOSIS — M25562 Pain in left knee: Secondary | ICD-10-CM

## 2023-04-21 DIAGNOSIS — M6281 Muscle weakness (generalized): Secondary | ICD-10-CM

## 2023-04-21 DIAGNOSIS — R262 Difficulty in walking, not elsewhere classified: Secondary | ICD-10-CM

## 2023-04-21 NOTE — Therapy (Signed)
OUTPATIENT PHYSICAL THERAPY LOWER EXTREMITY TREATMENT   Patient Name: Ashley Blair MRN: [id number] DOB:1955/08/25, 67 y.o., female Today's Date: 04/21/2023  END OF SESSION:  PT End of Session - 04/21/23 0842     Visit Number 14    Number of Visits 16    Date for PT Re-Evaluation 05/29/23    Authorization Type HTA    PT Start Time 0843    PT Stop Time 0929    PT Time Calculation (min) 46 min    Activity Tolerance Patient tolerated treatment well    Behavior During Therapy [facility] for tasks assessed/performed                  Past Medical History:  Diagnosis Date   Anxiety    Arthritis    Elevated hemoglobin A1c 2019   5.7   Family history of DES exposure    Fibrocystic breast    GERD (gastroesophageal reflux disease)    HPV (human papilloma virus) infection    Hypercholesterolemia    OCD (obsessive compulsive disorder)    Pre-diabetes    S/P excision of lipoma    Past Surgical History:  Procedure Laterality Date   ANKLE SURGERY     broken as a child   BREAST BIOPSY Right    COLONOSCOPY     DILATION AND CURETTAGE OF UTERUS     LIPOMA EXCISION     rib cage   TOTAL KNEE ARTHROPLASTY Left 02/02/2023   Procedure: LEFT TOTAL KNEE ARTHROPLASTY;  Surgeon: Tarry Kos, MD;  Location: [facility] OR;  Service: Orthopedics;  Laterality: Left;   TRIGGER FINGER RELEASE Left 03/2013   thumb   Patient Active Problem List   Diagnosis Date Noted   Status post total left knee replacement 02/02/2023   Primary osteoarthritis of left knee 02/01/2023   Family history of breast cancer in mother 12/12/2013   H/O diethylstilbestrol (DES) exposure in utero 12/12/2013    PCP:  Thana Ates, MD       REFERRING PROVIDER:  Tarry Kos, MD     REFERRING DIAG:  [phone] (ICD-10-CM) - Primary osteoarthritis of left knee      THERAPY DIAG:  Difficulty walking  Acute pain of left knee  Muscle weakness (generalized)  Rationale for Evaluation and Treatment:  Rehabilitation  ONSET DATE: 02/02/23 DOS Manipulation 8/29    Days since surgery: 53   Operative procedure: Left total knee arthroplasty.   SUBJECTIVE:   SUBJECTIVE STATEMENT: Able to go down stairs after riding bike- If I am not warm, it feels like my quads are going to rip.   PERTINENT HISTORY: N/A PAIN:  Are you having pain? No NPRS scale: 2-3/10 Pain location: posterior knee anterior knee  Pain description: "tight band feeling" Aggravating factors: bending Relieving factors: N/A, ice/rest   PRECAUTIONS: Knee  RED FLAGS: None   WEIGHT BEARING RESTRICTIONS: No  FALLS:  Has patient fallen in last 6 months? No  LIVING ENVIRONMENT: Lives with: lives with their family and lives with their spouse Lives in: House/apartment Stairs: Yes Has following equipment at home: Single point cane  OCCUPATION: Marketing executive; Office manager  PLOF: Independent  PATIENT GOALS: return to normal exercise    OBJECTIVE:   DIAGNOSTIC FINDINGS: N/A  PATIENT SURVEYS:  FOTO 39 60 @ DC 12 MCII   COGNITION: Overall cognitive status: Within functional limits for tasks assessed     SENSATION: WFL  EDEMA:  Moderate, palpable edema anteriorly and posteriorly into popliteal fossa  LOWER EXTREMITY ROM:  Active ROM Right eval Left eval L 7/20 L 7/24 Lt 8/5 Lt  8/5 8/19 8/30 9/3 04/08/23 04/10/23 04/14/23 9/11 04/17/23 04/18/23  Hip flexion                 Hip extension                 Hip abduction                 Hip adduction                 Hip internal rotation                 Hip external rotation                 Knee flexion 125 75 78 86 95 91 101 CKC  107 105 90 at beginning of session, improves to 110 90 Improves to  117 92 at beginning of session, improves to 115 90  116 at end of session 101 102  116 at end of session  Knee extension 3 -12 -5   1 0 0         Ankle dorsiflexion                 Ankle plantarflexion                 Ankle inversion                  Ankle eversion                  (Blank rows = not tested)   MMT 4+/5 on L   TODAY'S TREATMENT:  Treatment                            9/17:  ROM start at 104 Foot on table- lunge mobs Qped rocking- able to decrease lat/post knee impingement with inhibition squeeze on proximal gastroc.  Trigger Point Dry Needling, Manual Therapy Treatment:  Initial or subsequent education regarding Trigger Point Dry Needling: Subsequent Did patient give consent to treatment with Trigger Point Dry Needling: Yes TPDN with skilled palpation and monitoring followed by STM to the following muscles: Lt lateral gastroc & soleus  Prone eccentric HS lower, 5lb ankle weight Seated LAQ- 5lb, finding full ext with upright through spine & pelvis Able to achieve 115 in qped at end of session Mini squats & full squats tailbone to wall green tband at knees Hip diag press in turnout & chair pull off at bar- green tband at knees   04/18/23 Patellar mobs superior inferior - improves from 102 to 107 Mobilization with movement lunge with PA tibial glide with belt 109  following  Mobilizations grade III tibial AP glides in progressive flexion 111 following Knee extension machine 10# LLE only 3 x 10 Hamstring curl 10 # BLE down LLE up 1 x 10 Supine hamstring isometrics into red ball 10 x 10 second holds Manual fibular head AP/PA glides; STM to hamstrings, popliteal fossa region - 116 following   04/17/23 Contract relax in prone 2x 10 x 5 second holds improves to 111 Standing IT band stretch 3 x 20 second holds Deep squat at counter 2 x 10   Manual: STM to L vastus lateralis pre and post dry needling for trigger point identification and muscular relaxation. Trigger Point Dry-Needling  Treatment instructions: Expect mild to  moderate muscle soreness. S/S of pneumothorax if dry needled over a lung field, and to seek immediate medical attention should they occur. Patient verbalized understanding of these instructions  and education.  Patient Consent Given: Yes Education handout provided: Previously provided Muscles treated: L vastus lateralis Electrical stimulation performed: No Parameters: N/A Treatment response/outcome: decrease in tissue tension Lateral lunge with UE support at table bilat 2x10 Cannonball/feet together squat 2x 5 with table support and heel prop Split squat with RLE on chair 1 x 10    9/12 PROM L knee Sci-fit bike for ROM L tib/fem jt mobilizations with distraction and tibial ER/IR bias STM to medial/lateral/posterior knee LAQ 5lbs 2 hold at top, AAROM flexion stretch 5s at bottom 2x10      PATIENT EDUCATION:  Education details:  anatomy, exercise progression, DOMS expectations, muscle firing,  envelope of function, HEP, POC 04/10/23: hep scar mobilizations  Person educated: Patient Education method: Explanation, Demonstration, Tactile cues, Verbal cues, and Handouts Education comprehension: verbalized understanding, returned demonstration, verbal cues required, tactile cues required, and needs further education     HOME EXERCISE PROGRAM:  Access Code: [email or web address] URL: [email or web address]   ASSESSMENT:   CLINICAL IMPRESSION:  Will work on decreasing amount of warmup before doing stairs and progress ROM mobs to quadruped.     OBJECTIVE IMPAIRMENTS decreased ROM, decreased strength, hypomobility, increased fascial restrictions, increased muscle spasms, impaired flexibility, improper body mechanics, postural dysfunction, and pain.    ACTIVITY LIMITATIONS carrying, lifting, sitting, stairs, locomotion level   PARTICIPATION LIMITATIONS: driving, shopping, community activity, occupation, and yard work   PERSONAL FACTORS Age, Time since onset of injury/illness/exacerbation, and 1 comorbidity:    are also affecting patient's functional outcome.        GOALS:   SHORT TERM GOALS: Target date: 04/02/2023     Pt will become independent  with HEP in order to demonstrate synthesis of PT education.  Goal status: met   2.  Pt will be able to demonstrate at least 110 deg knee flexion  in order to demonstrate functional improvement in LE function for self-care and house hold duties.    Goal status: MET   3.  Pt will score at least 12 pt increase on FOTO to demonstrate functional improvement in MCII and pt perceived function.     Goal status: INITIAL       LONG TERM GOALS: Target date: 05/20/2023     Pt  will become independent with final HEP in order to demonstrate synthesis of PT education.    Goal status: ongoing   2.  Pt will be able to demonstrate/report ability to walk >30 mins without pain in order to demonstrate functional improvement and tolerance to exercise and community mobility.    Goal status: met   3.  Pt will be able to demonstrate full depth squat in order to demonstrate functional improvement in LE function for self-care and house hold duties.  ongoing  4.  Pt will score >/= 60 on FOTO to demonstrate improvement in perceived L knee function.  Baseline:  Goal status: ongoing  5.  Pt will be able to demonstrate full L knee ROM in order to demonstrate functional improvement in LE function for self-care and house hold duties.  ongoing      PLAN: PT FREQUENCY: 1-2x/week Additional 3rd visit in the week of 8/19-8/24/24 for aquatics appt 6/week for 2 weeks &2-3/week following   PT DURATION: POC date   PLANNED INTERVENTIONS: Therapeutic exercises, Therapeutic activity, Neuromuscular  re-education, Balance training, Gait training, Patient/Family education, Self Care, Joint mobilization, Joint manipulation, Orthotic/Fit training, Aquatic Therapy, Dry Needling, Electrical stimulation, Spinal manipulation, Spinal mobilization, Cryotherapy, Moist heat, Compression bandaging, scar mobilization, Splintting, Taping, Vasopneumatic device, Traction, Ultrasound, Ionotophoresis 4mg /ml Dexamethasone, Manual  therapy, and Re-evaluation   PLAN FOR NEXT SESSION: PROM into flexion, work back into there-ex as tolerated. Cupping/ DN PRN  Aryana Wonnacott C. Kyan Giannone PT, DPT 04/21/23 9:31 AM

## 2023-04-22 ENCOUNTER — Ambulatory Visit (HOSPITAL_BASED_OUTPATIENT_CLINIC_OR_DEPARTMENT_OTHER): Payer: HMO | Admitting: Physical Therapy

## 2023-04-22 ENCOUNTER — Encounter (HOSPITAL_BASED_OUTPATIENT_CLINIC_OR_DEPARTMENT_OTHER): Payer: Self-pay | Admitting: Physical Therapy

## 2023-04-22 ENCOUNTER — Encounter (HOSPITAL_BASED_OUTPATIENT_CLINIC_OR_DEPARTMENT_OTHER): Payer: HMO | Admitting: Physical Therapy

## 2023-04-22 DIAGNOSIS — R262 Difficulty in walking, not elsewhere classified: Secondary | ICD-10-CM

## 2023-04-22 DIAGNOSIS — M6281 Muscle weakness (generalized): Secondary | ICD-10-CM

## 2023-04-22 DIAGNOSIS — M25562 Pain in left knee: Secondary | ICD-10-CM

## 2023-04-22 NOTE — Therapy (Signed)
OUTPATIENT PHYSICAL THERAPY LOWER EXTREMITY TREATMENT   Patient Name: Ashley Blair MRN: [id number] DOB:May 21, 1956, 67 y.o., female Today's Date: 04/22/2023  END OF SESSION:  PT End of Session - 04/22/23 1355     Visit Number 15    Number of Visits 16    Date for PT Re-Evaluation 05/29/23    Authorization Type HTA    PT Start Time 1318    PT Stop Time 1357    PT Time Calculation (min) 39 min    Activity Tolerance Patient tolerated treatment well    Behavior During Therapy WFL for tasks assessed/performed                   Past Medical History:  Diagnosis Date   Anxiety    Arthritis    Elevated hemoglobin A1c 2019   5.7   Family history of DES exposure    Fibrocystic breast    GERD (gastroesophageal reflux disease)    HPV (human papilloma virus) infection    Hypercholesterolemia    OCD (obsessive compulsive disorder)    Pre-diabetes    S/P excision of lipoma    Past Surgical History:  Procedure Laterality Date   ANKLE SURGERY     broken as a child   BREAST BIOPSY Right    COLONOSCOPY     DILATION AND CURETTAGE OF UTERUS     LIPOMA EXCISION     rib cage   TOTAL KNEE ARTHROPLASTY Left 02/02/2023   Procedure: LEFT TOTAL KNEE ARTHROPLASTY;  Surgeon: Tarry Kos, MD;  Location: [facility] OR;  Service: Orthopedics;  Laterality: Left;   TRIGGER FINGER RELEASE Left 03/2013   thumb   Patient Active Problem List   Diagnosis Date Noted   Status post total left knee replacement 02/02/2023   Primary osteoarthritis of left knee 02/01/2023   Family history of breast cancer in mother 12/12/2013   H/O diethylstilbestrol (DES) exposure in utero 12/12/2013    PCP:  Thana Ates, MD       REFERRING PROVIDER:  Tarry Kos, MD     REFERRING DIAG:  [phone] (ICD-10-CM) - Primary osteoarthritis of left knee      THERAPY DIAG:  Difficulty walking  Acute pain of left knee  Muscle weakness (generalized)  Rationale for Evaluation and Treatment:  Rehabilitation  ONSET DATE: 02/02/23 DOS Manipulation 8/29    Days since surgery: 53   Operative procedure: Left total knee arthroplasty.   SUBJECTIVE:   SUBJECTIVE STATEMENT: Pt reports tightness with knee flexion still that might be weather related. No pain or soreness after last session.   PERTINENT HISTORY: N/A PAIN:  Are you having pain? No NPRS scale: 2-3/10 Pain location: posterior knee anterior knee  Pain description: "tight band feeling" Aggravating factors: bending Relieving factors: N/A, ice/rest   PRECAUTIONS: Knee  RED FLAGS: None   WEIGHT BEARING RESTRICTIONS: No  FALLS:  Has patient fallen in last 6 months? No  LIVING ENVIRONMENT: Lives with: lives with their family and lives with their spouse Lives in: House/apartment Stairs: Yes Has following equipment at home: Single point cane  OCCUPATION: Marketing executive; Office manager  PLOF: Independent  PATIENT GOALS: return to normal exercise    OBJECTIVE:   DIAGNOSTIC FINDINGS: N/A  PATIENT SURVEYS:  FOTO 39 60 @ DC 12 MCII   COGNITION: Overall cognitive status: Within functional limits for tasks assessed     SENSATION: WFL  EDEMA:  Moderate, palpable edema anteriorly and posteriorly into popliteal fossa  LOWER EXTREMITY  ROM:  Active ROM Right eval Left eval L 7/20 L 7/24 Lt 8/5 Lt  8/5 8/19 8/30 9/3 04/08/23 04/10/23 04/14/23 9/11 04/17/23 04/18/23  Hip flexion                 Hip extension                 Hip abduction                 Hip adduction                 Hip internal rotation                 Hip external rotation                 Knee flexion 125 75 78 86 95 91 101 CKC  107 105 90 at beginning of session, improves to 110 90 Improves to  117 92 at beginning of session, improves to 115 90  116 at end of session 101 102  116 at end of session  Knee extension 3 -12 -5   1 0 0         Ankle dorsiflexion                 Ankle plantarflexion                 Ankle  inversion                 Ankle eversion                  (Blank rows = not tested)   MMT 4+/5 on L   TODAY'S TREATMENT:  9/18 Starting 111; 116 after manual  STM L adductor and medial HS group L knee Grade IV tibial AP glides in progressive flexion with tibial IR and ER  Self STM of adductor and HS with foam roller  Shuttle SL leg press 50lbs 3x6  Cannonball/feet together squat  2x10 with table support and heel prop   Treatment                            9/17:  ROM start at 104 Foot on table- lunge mobs Qped rocking- able to decrease lat/post knee impingement with inhibition squeeze on proximal gastroc.  Trigger Point Dry Needling, Manual Therapy Treatment:  Initial or subsequent education regarding Trigger Point Dry Needling: Subsequent Did patient give consent to treatment with Trigger Point Dry Needling: Yes TPDN with skilled palpation and monitoring followed by STM to the following muscles: Lt lateral gastroc & soleus  Prone eccentric HS lower, 5lb ankle weight Seated LAQ- 5lb, finding full ext with upright through spine & pelvis Able to achieve 115 in qped at end of session Mini squats & full squats tailbone to wall green tband at knees Hip diag press in turnout & chair pull off at bar- green tband at knees   04/18/23 Patellar mobs superior inferior - improves from 102 to 107 Mobilization with movement lunge with PA tibial glide with belt 109  following  Mobilizations grade III tibial AP glides in progressive flexion 111 following Knee extension machine 10# LLE only 3 x 10 Hamstring curl 10 # BLE down LLE up 1 x 10 Supine hamstring isometrics into red ball 10 x 10 second holds Manual fibular head AP/PA glides; STM to hamstrings, popliteal fossa region - 116 following   04/17/23  Contract relax in prone 2x 10 x 5 second holds improves to 111 Standing IT band stretch 3 x 20 second holds Deep squat at counter 2 x 10   Manual: STM to L vastus lateralis pre and post  dry needling for trigger point identification and muscular relaxation. Trigger Point Dry-Needling  Treatment instructions: Expect mild to moderate muscle soreness. S/S of pneumothorax if dry needled over a lung field, and to seek immediate medical attention should they occur. Patient verbalized understanding of these instructions and education.  Patient Consent Given: Yes Education handout provided: Previously provided Muscles treated: L vastus lateralis Electrical stimulation performed: No Parameters: N/A Treatment response/outcome: decrease in tissue tension Lateral lunge with UE support at table bilat 2x10 [place]/feet together squat 2x 5 with table support and heel prop Split squat with RLE on chair 1 x 10    9/12 PROM L knee Sci-fit bike for ROM L tib/fem jt mobilizations with distraction and tibial ER/IR bias STM to medial/lateral/posterior knee LAQ 5lbs 2 hold at top, AAROM flexion stretch 5s at bottom 2x10      PATIENT EDUCATION:  Education details:  anatomy, exercise progression, DOMS expectations, muscle firing,  envelope of function, HEP, POC 04/10/23: hep scar mobilizations  Person educated: Patient Education method: Explanation, Demonstration, Tactile cues, Verbal cues, and Handouts Education comprehension: verbalized understanding, returned demonstration, verbal cues required, tactile cues required, and needs further education     HOME EXERCISE PROGRAM:  Access Code: [email or web address] URL: [email or web address]   ASSESSMENT:   CLINICAL IMPRESSION:  Pt improved to 116 knee flexion today but is limited due to posterior knee pain that appears related to edema and swelling. Pt is progressing with knee ROM but appears to be limited with expected edema post op. Pt is 11 wks at this time. Pt likely to progressively improve as she continues to heal post-operatively with progressive loading. Plan for PN and re-cert at next. Pt would benefit from  continued skilled therapy in order to reach goals and maximize functional L LE strength and ROM for full return to PLOF.     OBJECTIVE IMPAIRMENTS decreased ROM, decreased strength, hypomobility, increased fascial restrictions, increased muscle spasms, impaired flexibility, improper body mechanics, postural dysfunction, and pain.    ACTIVITY LIMITATIONS carrying, lifting, sitting, stairs, locomotion level   PARTICIPATION LIMITATIONS: driving, shopping, community activity, occupation, and yard work   PERSONAL FACTORS Age, Time since onset of injury/illness/exacerbation, and 1 comorbidity:    are also affecting patient's functional outcome.        GOALS:   SHORT TERM GOALS: Target date: 04/02/2023     Pt will become independent with HEP in order to demonstrate synthesis of PT education.  Goal status: met   2.  Pt will be able to demonstrate at least 110 deg knee flexion  in order to demonstrate functional improvement in LE function for self-care and house hold duties.    Goal status: MET   3.  Pt will score at least 12 pt increase on FOTO to demonstrate functional improvement in MCII and pt perceived function.     Goal status: INITIAL       LONG TERM GOALS: Target date: 05/20/2023     Pt  will become independent with final HEP in order to demonstrate synthesis of PT education.    Goal status: ongoing   2.  Pt will be able to demonstrate/report ability to walk >30 mins without pain in order to demonstrate functional improvement and tolerance to exercise and  community mobility.    Goal status: met   3.  Pt will be able to demonstrate full depth squat in order to demonstrate functional improvement in LE function for self-care and house hold duties.  ongoing  4.  Pt will score >/= 60 on FOTO to demonstrate improvement in perceived L knee function.  Baseline:  Goal status: ongoing  5.  Pt will be able to demonstrate full L knee ROM in order to demonstrate functional  improvement in LE function for self-care and house hold duties.  ongoing      PLAN: PT FREQUENCY: 1-2x/week Additional 3rd visit in the week of 8/19-8/24/24 for aquatics appt 6/week for 2 weeks &2-3/week following   PT DURATION: POC date   PLANNED INTERVENTIONS: Therapeutic exercises, Therapeutic activity, Neuromuscular re-education, Balance training, Gait training, Patient/Family education, Self Care, Joint mobilization, Joint manipulation, Orthotic/Fit training, Aquatic Therapy, Dry Needling, Electrical stimulation, Spinal manipulation, Spinal mobilization, Cryotherapy, Moist heat, Compression bandaging, scar mobilization, Splintting, Taping, Vasopneumatic device, Traction, Ultrasound, Ionotophoresis 4mg /ml Dexamethasone, Manual therapy, and Re-evaluation   PLAN FOR NEXT SESSION: PROM into flexion, work back into there-ex as tolerated. Cupping/ DN PRN  Zebedee Iba PT, DPT 04/22/23 2:10 PM

## 2023-04-25 ENCOUNTER — Ambulatory Visit (HOSPITAL_BASED_OUTPATIENT_CLINIC_OR_DEPARTMENT_OTHER): Payer: HMO | Admitting: Physical Therapy

## 2023-04-25 ENCOUNTER — Encounter (HOSPITAL_BASED_OUTPATIENT_CLINIC_OR_DEPARTMENT_OTHER): Payer: Self-pay | Admitting: Physical Therapy

## 2023-04-25 DIAGNOSIS — R262 Difficulty in walking, not elsewhere classified: Secondary | ICD-10-CM

## 2023-04-25 DIAGNOSIS — M25562 Pain in left knee: Secondary | ICD-10-CM

## 2023-04-25 DIAGNOSIS — M6281 Muscle weakness (generalized): Secondary | ICD-10-CM

## 2023-04-25 NOTE — Therapy (Signed)
OUTPATIENT PHYSICAL THERAPY LOWER EXTREMITY TREATMENT   Patient Name: Ashley Blair MRN: [id number] DOB:[birth year], 67 y.o., female Today's Date: 04/25/2023  END OF SESSION:  PT End of Session - 04/25/23 1135     Visit Number 16    Number of Visits 16    Date for PT Re-Evaluation 05/29/23    Authorization Type HTA    PT Start Time 1000    PT Stop Time 1045    PT Time Calculation (min) 45 min    Activity Tolerance Patient tolerated treatment well    Behavior During Therapy WFL for tasks assessed/performed                    Past Medical History:  Diagnosis Date   Anxiety    Arthritis    Elevated hemoglobin A1c 2019   5.7   Family history of DES exposure    Fibrocystic breast    GERD (gastroesophageal reflux disease)    HPV (human papilloma virus) infection    Hypercholesterolemia    OCD (obsessive compulsive disorder)    Pre-diabetes    S/P excision of lipoma    Past Surgical History:  Procedure Laterality Date   ANKLE SURGERY     broken as a child   BREAST BIOPSY Right    COLONOSCOPY     DILATION AND CURETTAGE OF UTERUS     LIPOMA EXCISION     rib cage   TOTAL KNEE ARTHROPLASTY Left 02/02/2023   Procedure: LEFT TOTAL KNEE ARTHROPLASTY;  Surgeon: Tarry Kos, MD;  Location: [facility] OR;  Service: Orthopedics;  Laterality: Left;   TRIGGER FINGER RELEASE Left 03/2013   thumb   Patient Active Problem List   Diagnosis Date Noted   Status post total left knee replacement 02/02/2023   Primary osteoarthritis of left knee 02/01/2023   Family history of breast cancer in mother 12/12/2013   H/O diethylstilbestrol (DES) exposure in utero 12/12/2013    PCP:  Thana Ates, MD       REFERRING PROVIDER:  Tarry Kos, MD     REFERRING DIAG:  [phone] (ICD-10-CM) - Primary osteoarthritis of left knee      THERAPY DIAG:  Difficulty walking  Acute pain of left knee  Muscle weakness (generalized)  Rationale for Evaluation and Treatment:  Rehabilitation  ONSET DATE: 02/02/23 DOS Manipulation 8/29    Days since surgery: 53   Operative procedure: Left total knee arthroplasty.   SUBJECTIVE:   SUBJECTIVE STATEMENT: Pt states that she notes most pain behind her knee and stiffness first thing in the morning. She has some difficulty with the stairs. Great improvements reported with functional movements, such as squatting and walking.   PERTINENT HISTORY: N/A PAIN:  Are you having pain? No NPRS scale: 2-3/10 Pain location: posterior knee anterior knee  Pain description: "tight band feeling" Aggravating factors: bending Relieving factors: N/A, ice/rest   PRECAUTIONS: Knee  RED FLAGS: None   WEIGHT BEARING RESTRICTIONS: No  FALLS:  Has patient fallen in last 6 months? No  LIVING ENVIRONMENT: Lives with: lives with their family and lives with their spouse Lives in: House/apartment Stairs: Yes Has following equipment at home: Single point cane  OCCUPATION: Marketing executive; Office manager  PLOF: Independent  PATIENT GOALS: return to normal exercise    OBJECTIVE:   DIAGNOSTIC FINDINGS: N/A  PATIENT SURVEYS:  FOTO 39 60 @ DC 12 MCII   COGNITION: Overall cognitive status: Within functional limits for tasks assessed  SENSATION: WFL  EDEMA:  Moderate, palpable edema anteriorly and posteriorly into popliteal fossa  LOWER EXTREMITY ROM:  Active ROM Right eval Left eval L 7/20 L 7/24 Lt 8/5 Lt  8/5 8/19 8/30 9/3 04/08/23 04/10/23 04/14/23 9/11 04/17/23 04/18/23  Hip flexion                 Hip extension                 Hip abduction                 Hip adduction                 Hip internal rotation                 Hip external rotation                 Knee flexion 125 75 78 86 95 91 101 CKC  107 105 90 at beginning of session, improves to 110 90 Improves to  117 92 at beginning of session, improves to 115 90  116 at end of session 101 102  116 at end of session  Knee extension 3 -12 -5    1 0 0         Ankle dorsiflexion                 Ankle plantarflexion                 Ankle inversion                 Ankle eversion                  (Blank rows = not tested)   MMT 4+/5 on L   TODAY'S TREATMENT:  9/18 Starting 111; 116 after manual  STM L adductor and medial HS group L knee Grade IV tibial AP glides in progressive flexion with tibial IR and ER  Self STM of adductor and HS with foam roller  Shuttle SL leg press 50lbs 3x6  Cannonball/feet together squat  2x10 with table support and heel prop  Treatment                            9/21:  Bike for 8 min while PT present for subjective reports.  Deep squats while holding onto bar x20  Lateral lunges with 10 sec hold x10 each leg Side stepping with green theraband around ankles x 6 laps 1x walk around clinic circle for gait assessment 2x stairs with focus on knee flexion upon ascending and eccentric control with descend.  SL squats on shuttle with 75# x10  DL jumps on shuttle with 40# x 30  Pin and stretch to quad Cross friction massage to lateral gatroc and STM to gastroc  ROM in seated 122 degrees with OP Supine 121 with OP.  Pt is able to reach ~110 with active ROM in seated and supine.  Treatment                            9/17:  ROM start at 104 Foot on table- lunge mobs Qped rocking- able to decrease lat/post knee impingement with inhibition squeeze on proximal gastroc.  Trigger Point Dry Needling, Manual Therapy Treatment:  Initial or subsequent education regarding Trigger Point Dry Needling: Subsequent Did patient give consent to treatment with  Trigger Point Dry Needling: Yes TPDN with skilled palpation and monitoring followed by STM to the following muscles: Lt lateral gastroc & soleus  Prone eccentric HS lower, 5lb ankle weight Seated LAQ- 5lb, finding full ext with upright through spine & pelvis Able to achieve 115 in qped at end of session Mini squats & full squats tailbone to wall green tband  at knees Hip diag press in turnout & chair pull off at bar- green tband at knees   04/18/23 Patellar mobs superior inferior - improves from 102 to 107 Mobilization with movement lunge with PA tibial glide with belt 109  following  Mobilizations grade III tibial AP glides in progressive flexion 111 following Knee extension machine 10# LLE only 3 x 10 Hamstring curl 10 # BLE down LLE up 1 x 10 Supine hamstring isometrics into red ball 10 x 10 second holds Manual fibular head AP/PA glides; STM to hamstrings, popliteal fossa region - 116 following   04/17/23 Contract relax in prone 2x 10 x 5 second holds improves to 111 Standing IT band stretch 3 x 20 second holds Deep squat at counter 2 x 10   Manual: STM to L vastus lateralis pre and post dry needling for trigger point identification and muscular relaxation. Trigger Point Dry-Needling  Treatment instructions: Expect mild to moderate muscle soreness. S/S of pneumothorax if dry needled over a lung field, and to seek immediate medical attention should they occur. Patient verbalized understanding of these instructions and education.  Patient Consent Given: Yes Education handout provided: Previously provided Muscles treated: L vastus lateralis Electrical stimulation performed: No Parameters: N/A Treatment response/outcome: decrease in tissue tension Lateral lunge with UE support at table bilat 2x10 Cannonball/feet together squat 2x 5 with table support and heel prop Split squat with RLE on chair 1 x 10    9/12 PROM L knee Sci-fit bike for ROM L tib/fem jt mobilizations with distraction and tibial ER/IR bias STM to medial/lateral/posterior knee LAQ 5lbs 2 hold at top, AAROM flexion stretch 5s at bottom 2x10      PATIENT EDUCATION:  Education details:  anatomy, exercise progression, DOMS expectations, muscle firing,  envelope of function, HEP, POC 04/10/23: hep scar mobilizations  Person educated: Patient Education method:  Explanation, Demonstration, Tactile cues, Verbal cues, and Handouts Education comprehension: verbalized understanding, returned demonstration, verbal cues required, tactile cues required, and needs further education     HOME EXERCISE PROGRAM:  Access Code: [email or web address] URL: [email or web address]   ASSESSMENT:   CLINICAL IMPRESSION:  Pt improved to 121 knee flexion today with OP. She continues to report pain in lateral gastroc. She has some grit in lateral gastroc head tendon insertion and tension throughout lateral gastroc with deep pressure. Pt would benefit from DN to address this area next session. Challenged pt with more dynamic strengthening today with good tolerance. She continues to walk with slight hip hike on L side with gait and stairs. Improvements noted with cues. Pt likely to progressively improve as she continues to heal post-operatively with progressive loading. Plan for PN and re-cert at next session. Pt would benefit from continued skilled therapy in order to reach goals and maximize functional L LE strength and ROM for full return to PLOF.   OBJECTIVE IMPAIRMENTS decreased ROM, decreased strength, hypomobility, increased fascial restrictions, increased muscle spasms, impaired flexibility, improper body mechanics, postural dysfunction, and pain.    ACTIVITY LIMITATIONS carrying, lifting, sitting, stairs, locomotion level   PARTICIPATION LIMITATIONS: driving, shopping, community activity, occupation, and yard work  PERSONAL FACTORS Age, Time since onset of injury/illness/exacerbation, and 1 comorbidity:    are also affecting patient's functional outcome.        GOALS:   SHORT TERM GOALS: Target date: 04/02/2023     Pt will become independent with HEP in order to demonstrate synthesis of PT education.  Goal status: met   2.  Pt will be able to demonstrate at least 110 deg knee flexion  in order to demonstrate functional improvement in LE function  for self-care and house hold duties.    Goal status: MET   3.  Pt will score at least 12 pt increase on FOTO to demonstrate functional improvement in MCII and pt perceived function.     Goal status: INITIAL       LONG TERM GOALS: Target date: 05/20/2023     Pt  will become independent with final HEP in order to demonstrate synthesis of PT education.    Goal status: ongoing   2.  Pt will be able to demonstrate/report ability to walk >30 mins without pain in order to demonstrate functional improvement and tolerance to exercise and community mobility.    Goal status: met   3.  Pt will be able to demonstrate full depth squat in order to demonstrate functional improvement in LE function for self-care and house hold duties.  ongoing  4.  Pt will score >/= 60 on FOTO to demonstrate improvement in perceived L knee function.  Baseline:  Goal status: ongoing  5.  Pt will be able to demonstrate full L knee ROM in order to demonstrate functional improvement in LE function for self-care and house hold duties.  ongoing   PLAN: PT FREQUENCY: 1-2x/week Additional 3rd visit in the week of 8/19-8/24/24 for aquatics appt 6/week for 2 weeks &2-3/week following   PT DURATION: POC date   PLANNED INTERVENTIONS: Therapeutic exercises, Therapeutic activity, Neuromuscular re-education, Balance training, Gait training, Patient/Family education, Self Care, Joint mobilization, Joint manipulation, Orthotic/Fit training, Aquatic Therapy, Dry Needling, Electrical stimulation, Spinal manipulation, Spinal mobilization, Cryotherapy, Moist heat, Compression bandaging, scar mobilization, Splintting, Taping, Vasopneumatic device, Traction, Ultrasound, Ionotophoresis 4mg /ml Dexamethasone, Manual therapy, and Re-evaluation   PLAN FOR NEXT SESSION: PROM into flexion, work back into there-ex as tolerated. Cupping/ DN PRN. CERT   Royal Hawthorn PT, DPT 04/25/23  11:36 AM

## 2023-04-27 ENCOUNTER — Encounter (HOSPITAL_BASED_OUTPATIENT_CLINIC_OR_DEPARTMENT_OTHER): Payer: Self-pay | Admitting: Physical Therapy

## 2023-04-27 ENCOUNTER — Ambulatory Visit (HOSPITAL_BASED_OUTPATIENT_CLINIC_OR_DEPARTMENT_OTHER): Payer: HMO | Admitting: Physical Therapy

## 2023-04-27 DIAGNOSIS — R262 Difficulty in walking, not elsewhere classified: Secondary | ICD-10-CM

## 2023-04-27 DIAGNOSIS — M25562 Pain in left knee: Secondary | ICD-10-CM

## 2023-04-27 DIAGNOSIS — M6281 Muscle weakness (generalized): Secondary | ICD-10-CM

## 2023-04-27 NOTE — Therapy (Signed)
OUTPATIENT PHYSICAL THERAPY LOWER EXTREMITY TREATMENT   Patient Name: Ashley Blair MRN: [id number] DOB:07-23-1956, 67 y.o., female Today's Date: 04/27/2023  END OF SESSION:  PT End of Session - 04/27/23 0928     Visit Number 17    Number of Visits 30    Date for PT Re-Evaluation 07/26/23    Authorization Type HTA    PT Start Time 0930    PT Stop Time 1015    PT Time Calculation (min) 45 min    Activity Tolerance Patient tolerated treatment well    Behavior During Therapy WFL for tasks assessed/performed                    Past Medical History:  Diagnosis Date   Anxiety    Arthritis    Elevated hemoglobin A1c 2019   5.7   Family history of DES exposure    Fibrocystic breast    GERD (gastroesophageal reflux disease)    HPV (human papilloma virus) infection    Hypercholesterolemia    OCD (obsessive compulsive disorder)    Pre-diabetes    S/P excision of lipoma    Past Surgical History:  Procedure Laterality Date   ANKLE SURGERY     broken as a child   BREAST BIOPSY Right    COLONOSCOPY     DILATION AND CURETTAGE OF UTERUS     LIPOMA EXCISION     rib cage   TOTAL KNEE ARTHROPLASTY Left 02/02/2023   Procedure: LEFT TOTAL KNEE ARTHROPLASTY;  Surgeon: Tarry Kos, MD;  Location: [facility] OR;  Service: Orthopedics;  Laterality: Left;   TRIGGER FINGER RELEASE Left 03/2013   thumb   Patient Active Problem List   Diagnosis Date Noted   Status post total left knee replacement 02/02/2023   Primary osteoarthritis of left knee 02/01/2023   Family history of breast cancer in mother 12/12/2013   H/O diethylstilbestrol (DES) exposure in utero 12/12/2013    PCP:  Thana Ates, MD       REFERRING PROVIDER:  Tarry Kos, MD     REFERRING DIAG:  [phone] (ICD-10-CM) - Primary osteoarthritis of left knee      THERAPY DIAG:  Difficulty walking - Plan: PT plan of care cert/re-cert  Acute pain of left knee - Plan: PT plan of care cert/re-cert  Muscle  weakness (generalized) - Plan: PT plan of care cert/re-cert  Rationale for Evaluation and Treatment: Rehabilitation  ONSET DATE: 02/02/23 DOS Manipulation 8/29    Days since surgery: 53   Operative procedure: Left total knee arthroplasty.   SUBJECTIVE:   SUBJECTIVE STATEMENT: Pt states that the knee just feels stiff and the knee feels better compared to before. She feels stairs are easier than before but she still has difficulty going down while holding items. Walking is improving   PERTINENT HISTORY: N/A PAIN:  Are you having pain? No NPRS scale: 2-3/10 Pain location: posterior knee anterior knee  Pain description: "tight band feeling" Aggravating factors: bending Relieving factors: N/A, ice/rest   PRECAUTIONS: Knee  RED FLAGS: None   WEIGHT BEARING RESTRICTIONS: No  FALLS:  Has patient fallen in last 6 months? No  LIVING ENVIRONMENT: Lives with: lives with their family and lives with their spouse Lives in: House/apartment Stairs: Yes Has following equipment at home: Single point cane  OCCUPATION: Marketing executive; Office manager  PLOF: Independent  PATIENT GOALS: return to normal exercise    OBJECTIVE:   DIAGNOSTIC FINDINGS: N/A  PATIENT SURVEYS:  FOTO  39 60 @ DC 12 MCII  FOTO 72% (GOAL MET 9/23)   COGNITION: Overall cognitive status: Within functional limits for tasks assessed     SENSATION: WFL  EDEMA:  Moderate, palpable edema anteriorly and posteriorly into popliteal fossa  LOWER EXTREMITY ROM:  Active ROM Right eval Left eval L 7/20 L 7/24 Lt 8/5 Lt  8/5 8/19 8/30 9/3 04/08/23 04/10/23 04/14/23 9/11 04/17/23 04/18/23 04/27/23  Hip flexion                  Hip extension                  Hip abduction                  Hip adduction                  Hip internal rotation                  Hip external rotation                  Knee flexion 125 75 78 86 95 91 101 CKC  107 105 90 at beginning of session, improves to 110 90 Improves to   117 92 at beginning of session, improves to 115 90  116 at end of session 101 102  116 at end of session 117  Knee extension 3 -12 -5   1 0 0        0  Ankle dorsiflexion                  Ankle plantarflexion                  Ankle inversion                  Ankle eversion                   (Blank rows = not tested)  MMT (HHD in lbs)  Right 9/23 Left 9/23  Hip flexion    Hip extension    Hip abduction    Hip adduction    Hip internal rotation    Hip external rotation    Knee flexion 38.3, 38.8, 38.2 27.6, 33.5, 31.1  Knee extension 39.5, 43.6, 46.8 39.9, 42.1, 41.1  Ankle dorsiflexion    Ankle plantarflexion    Ankle inversion    Ankle eversion     (Blank rows = not tested)   TODAY'S TREATMENT:  9/23  Trigger Point Dry-Needling  Treatment instructions: Expect mild to moderate muscle soreness. S/S of pneumothorax if dry needled over a lung field, and to seek immediate medical attention should they occur. Patient verbalized understanding of these instructions and education.   Patient Consent Given: Yes Education (verbally/handout)provided: Yes Muscles treated: L lateral gastroc and L lateral HS Electrical stimulation performed: none Parameters:   n/a Treatment response/outcome: several large LTR elicited; palpable increase in soft tissue extensibility  STM L gastroc and HS Cannonball/feet together squat  3x8 with table support and heel prop  Recovery, exercise progression, healing timeline, self STM    Starting 109;  9/18 Starting 111; 116 after manual  STM L adductor and medial HS group L knee Grade IV tibial AP glides in progressive flexion with tibial IR and ER  Self STM of adductor and HS with foam roller  Shuttle SL leg press 50lbs 3x6  Cannonball/feet together squat  3x8 with table support and  heel prop  Treatment                            9/21:  Bike for 8 min while PT present for subjective reports.  Deep squats while holding onto bar x20   Lateral lunges with 10 sec hold x10 each leg Side stepping with green theraband around ankles x 6 laps 1x walk around clinic circle for gait assessment 2x stairs with focus on knee flexion upon ascending and eccentric control with descend.  SL squats on shuttle with 75# x10  DL jumps on shuttle with 78# x 30  Pin and stretch to quad Cross friction massage to lateral gatroc and STM to gastroc  ROM in seated 122 degrees with OP Supine 121 with OP.  Pt is able to reach ~110 with active ROM in seated and supine.  Treatment                            9/17:  ROM start at 104 Foot on table- lunge mobs Qped rocking- able to decrease lat/post knee impingement with inhibition squeeze on proximal gastroc.  Trigger Point Dry Needling, Manual Therapy Treatment:  Initial or subsequent education regarding Trigger Point Dry Needling: Subsequent Did patient give consent to treatment with Trigger Point Dry Needling: Yes TPDN with skilled palpation and monitoring followed by STM to the following muscles: Lt lateral gastroc & soleus  Prone eccentric HS lower, 5lb ankle weight Seated LAQ- 5lb, finding full ext with upright through spine & pelvis Able to achieve 115 in qped at end of session Mini squats & full squats tailbone to wall green tband at knees Hip diag press in turnout & chair pull off at bar- green tband at knees   04/18/23 Patellar mobs superior inferior - improves from 102 to 107 Mobilization with movement lunge with PA tibial glide with belt 109  following  Mobilizations grade III tibial AP glides in progressive flexion 111 following Knee extension machine 10# LLE only 3 x 10 Hamstring curl 10 # BLE down LLE up 1 x 10 Supine hamstring isometrics into red ball 10 x 10 second holds Manual fibular head AP/PA glides; STM to hamstrings, popliteal fossa region - 116 following   04/17/23 Contract relax in prone 2x 10 x 5 second holds improves to 111 Standing IT band stretch 3 x 20 second  holds Deep squat at counter 2 x 10   Manual: STM to L vastus lateralis pre and post dry needling for trigger point identification and muscular relaxation. Trigger Point Dry-Needling  Treatment instructions: Expect mild to moderate muscle soreness. S/S of pneumothorax if dry needled over a lung field, and to seek immediate medical attention should they occur. Patient verbalized understanding of these instructions and education.  Patient Consent Given: Yes Education handout provided: Previously provided Muscles treated: L vastus lateralis Electrical stimulation performed: No Parameters: N/A Treatment response/outcome: decrease in tissue tension Lateral lunge with UE support at table bilat 2x10 Cannonball/feet together squat 2x 5 with table support and heel prop Split squat with RLE on chair 1 x 10    9/12 PROM L knee Sci-fit bike for ROM L tib/fem jt mobilizations with distraction and tibial ER/IR bias STM to medial/lateral/posterior knee LAQ 5lbs 2 hold at top, AAROM flexion stretch 5s at bottom 2x10      PATIENT EDUCATION:  Education details:  anatomy, exercise progression, DOMS expectations, muscle  firing,  envelope of function, HEP, POC 04/10/23: hep scar mobilizations  Person educated: Patient Education method: Explanation, Demonstration, Tactile cues, Verbal cues, and Handouts Education comprehension: verbalized understanding, returned demonstration, verbal cues required, tactile cues required, and needs further education     HOME EXERCISE PROGRAM:  Access Code: [email or web address] URL: [email or web address]   ASSESSMENT:   CLINICAL IMPRESSION:  Pt continues to improve with ROM measures as well as demos 80% strength of knee ext and flexion as compared to non-surgical side. Pt continues to improve with gait mechanics but does have difficulty with eccentric L quad strength as well as knee flexion strength due to posterior knee pain. Pt does response  well to TPDN and manual therapy following by progressive loading. Pt to continue with loaded knee flexion ROM as tolerated as well as HS and quad strength. FOTO goal exceeded at today's visit. Pt would benefit from continued skilled therapy in order to reach goals and maximize functional L LE strength and ROM for full return to PLOF.   OBJECTIVE IMPAIRMENTS decreased ROM, decreased strength, hypomobility, increased fascial restrictions, increased muscle spasms, impaired flexibility, improper body mechanics, postural dysfunction, and pain.    ACTIVITY LIMITATIONS carrying, lifting, sitting, stairs, locomotion level   PARTICIPATION LIMITATIONS: driving, shopping, community activity, occupation, and yard work   PERSONAL FACTORS Age, Time since onset of injury/illness/exacerbation, and 1 comorbidity:    are also affecting patient's functional outcome.        GOALS:   SHORT TERM GOALS: Target date: 04/02/2023     Pt will become independent with HEP in order to demonstrate synthesis of PT education.  Goal status: met   2.  Pt will be able to demonstrate at least 110 deg knee flexion  in order to demonstrate functional improvement in LE function for self-care and house hold duties.    Goal status: MET   3.  Pt will score at least 12 pt increase on FOTO to demonstrate functional improvement in MCII and pt perceived function.     Goal status: MET       LONG TERM GOALS: Target date: 07/20/2023      Pt  will become independent with final HEP in order to demonstrate synthesis of PT education.    Goal status: ongoing   2.  Pt will be able to demonstrate/report ability to walk >30 mins without pain in order to demonstrate functional improvement and tolerance to exercise and community mobility.    Goal status: met   3.  Pt will be able to demonstrate full depth squat in order to demonstrate functional improvement in LE function for self-care and house hold duties.  ongoing  4.  Pt  will score >/= 60 on FOTO to demonstrate improvement in perceived L knee function.  Baseline:  Goal status: MET  5.  Pt will be able to demonstrate full L knee ROM in order to demonstrate functional improvement in LE function for self-care and house hold duties.  ongoing   PLAN: PT FREQUENCY: 1-2x/week 12 wks   PT DURATION: POC date   PLANNED INTERVENTIONS: Therapeutic exercises, Therapeutic activity, Neuromuscular re-education, Balance training, Gait training, Patient/Family education, Self Care, Joint mobilization, Joint manipulation, Orthotic/Fit training, Aquatic Therapy, Dry Needling, Electrical stimulation, Spinal manipulation, Spinal mobilization, Cryotherapy, Moist heat, Compression bandaging, scar mobilization, Splintting, Taping, Vasopneumatic device, Traction, Ultrasound, Ionotophoresis 4mg /ml Dexamethasone, Manual therapy, and Re-evaluation   PLAN FOR NEXT SESSION: PROM into flexion, work back into there-ex as tolerated. Cupping/ DN PRN. CERT  Zebedee Iba PT, DPT 04/27/23 10:52 AM

## 2023-04-29 ENCOUNTER — Encounter (HOSPITAL_BASED_OUTPATIENT_CLINIC_OR_DEPARTMENT_OTHER): Payer: Self-pay | Admitting: Physical Therapy

## 2023-04-29 ENCOUNTER — Ambulatory Visit (HOSPITAL_BASED_OUTPATIENT_CLINIC_OR_DEPARTMENT_OTHER): Payer: HMO | Admitting: Physical Therapy

## 2023-04-29 DIAGNOSIS — M25562 Pain in left knee: Secondary | ICD-10-CM

## 2023-04-29 DIAGNOSIS — R262 Difficulty in walking, not elsewhere classified: Secondary | ICD-10-CM

## 2023-04-29 DIAGNOSIS — M6281 Muscle weakness (generalized): Secondary | ICD-10-CM

## 2023-04-29 NOTE — Therapy (Signed)
OUTPATIENT PHYSICAL THERAPY LOWER EXTREMITY TREATMENT   Patient Name: Mahliyah Zambo MRN: [id number] DOB:1955/12/03, 67 y.o., female Today's Date: 04/29/2023  END OF SESSION:  PT End of Session - 04/29/23 0839     Visit Number 18    Number of Visits 30    Date for PT Re-Evaluation 07/26/23    Authorization Type HTA    PT Start Time 0843    PT Stop Time 0927    PT Time Calculation (min) 44 min    Activity Tolerance Patient tolerated treatment well    Behavior During Therapy [facility] for tasks assessed/performed                    Past Medical History:  Diagnosis Date   Anxiety    Arthritis    Elevated hemoglobin A1c 2019   5.7   Family history of DES exposure    Fibrocystic breast    GERD (gastroesophageal reflux disease)    HPV (human papilloma virus) infection    Hypercholesterolemia    OCD (obsessive compulsive disorder)    Pre-diabetes    S/P excision of lipoma    Past Surgical History:  Procedure Laterality Date   ANKLE SURGERY     broken as a child   BREAST BIOPSY Right    COLONOSCOPY     DILATION AND CURETTAGE OF UTERUS     LIPOMA EXCISION     rib cage   TOTAL KNEE ARTHROPLASTY Left 02/02/2023   Procedure: LEFT TOTAL KNEE ARTHROPLASTY;  Surgeon: Tarry Kos, MD;  Location: [facility] OR;  Service: Orthopedics;  Laterality: Left;   TRIGGER FINGER RELEASE Left 03/2013   thumb   Patient Active Problem List   Diagnosis Date Noted   Status post total left knee replacement 02/02/2023   Primary osteoarthritis of left knee 02/01/2023   Family history of breast cancer in mother 12/12/2013   H/O diethylstilbestrol (DES) exposure in utero 12/12/2013    PCP:  Thana Ates, MD       REFERRING PROVIDER:  Tarry Kos, MD     REFERRING DIAG:  [phone] (ICD-10-CM) - Primary osteoarthritis of left knee      THERAPY DIAG:  Difficulty walking  Acute pain of left knee  Muscle weakness (generalized)  Rationale for Evaluation and Treatment:  Rehabilitation  ONSET DATE: 02/02/23 DOS Manipulation 8/29    Days since surgery: 53   Operative procedure: Left total knee arthroplasty.   SUBJECTIVE:   SUBJECTIVE STATEMENT: Pt states that knee is doing alright. Continued stiff and tight.   PERTINENT HISTORY: N/A PAIN:  Are you having pain? No NPRS scale: 2-3/10 Pain location: posterior knee anterior knee  Pain description: "tight band feeling" Aggravating factors: bending Relieving factors: N/A, ice/rest   PRECAUTIONS: Knee  RED FLAGS: None   WEIGHT BEARING RESTRICTIONS: No  FALLS:  Has patient fallen in last 6 months? No  LIVING ENVIRONMENT: Lives with: lives with their family and lives with their spouse Lives in: House/apartment Stairs: Yes Has following equipment at home: Single point cane  OCCUPATION: Marketing executive; Office manager  PLOF: Independent  PATIENT GOALS: return to normal exercise    OBJECTIVE:   DIAGNOSTIC FINDINGS: N/A  PATIENT SURVEYS:  FOTO 39 60 @ DC 12 MCII  FOTO 72% (GOAL MET 9/23)   COGNITION: Overall cognitive status: Within functional limits for tasks assessed     SENSATION: WFL  EDEMA:  Moderate, palpable edema anteriorly and posteriorly into popliteal fossa  LOWER EXTREMITY ROM:  Active ROM Right eval Left eval L 7/20 L 7/24 Lt 8/5 Lt  8/5 8/19 8/30 9/3 04/08/23 04/10/23 04/14/23 9/11 04/17/23 04/18/23 04/27/23 04/28/23  Hip flexion                   Hip extension                   Hip abduction                   Hip adduction                   Hip internal rotation                   Hip external rotation                   Knee flexion 125 75 78 86 95 91 101 CKC  107 105 90 at beginning of session, improves to 110 90 Improves to  117 92 at beginning of session, improves to 115 90  116 at end of session 101 102  116 at end of session 117 108 at beginning of session  Knee extension 3 -12 -5   1 0 0        0 0  Ankle dorsiflexion                   Ankle  plantarflexion                   Ankle inversion                   Ankle eversion                    (Blank rows = not tested)  MMT (HHD in lbs)  Right 9/23 Left 9/23  Hip flexion    Hip extension    Hip abduction    Hip adduction    Hip internal rotation    Hip external rotation    Knee flexion 38.3, 38.8, 38.2 27.6, 33.5, 31.1  Knee extension 39.5, 43.6, 46.8 39.9, 42.1, 41.1  Ankle dorsiflexion    Ankle plantarflexion    Ankle inversion    Ankle eversion     (Blank rows = not tested)   TODAY'S TREATMENT:  04/29/23 Manual: STM to L lateral gastroc pre and post dry needling for trigger point identification and muscular relaxation.  Trigger Point Dry-Needling  Treatment instructions: Expect mild to moderate muscle soreness. S/S of pneumothorax if dry needled over a lung field, and to seek immediate medical attention should they occur. Patient verbalized understanding of these instructions and education.   Patient Consent Given: Yes Education (verbally/handout)provided: Yes Muscles treated: L lateral gastroc and L lateral HS Electrical stimulation performed: none Parameters:   n/a Treatment response/outcome: several large LTR elicited; palpable increase in soft tissue extensibility Standing soleus stretch 3 x 20 second holds Cannonball/feet together squat  3x8 with table support and heel prop Shuttle SL leg press 50lbs 3x10 Split squat with RLE on table and unilateral UE support 2 x 10    9/23  Trigger Point Dry-Needling  Treatment instructions: Expect mild to moderate muscle soreness. S/S of pneumothorax if dry needled over a lung field, and to seek immediate medical attention should they occur. Patient verbalized understanding of these instructions and education.   Patient Consent Given: Yes Education (verbally/handout)provided: Yes Muscles treated: L lateral gastroc and L lateral  HS Electrical stimulation performed: none Parameters:   n/a Treatment  response/outcome: several large LTR elicited; palpable increase in soft tissue extensibility  STM L gastroc and HS Cannonball/feet together squat  3x8 with table support and heel prop  Recovery, exercise progression, healing timeline, self STM    Starting 109;  9/18 Starting 111; 116 after manual  STM L adductor and medial HS group L knee Grade IV tibial AP glides in progressive flexion with tibial IR and ER  Self STM of adductor and HS with foam roller  Shuttle SL leg press 50lbs 3x6  Cannonball/feet together squat  3x8 with table support and heel prop  Treatment                            9/21:  Bike for 8 min while PT present for subjective reports.  Deep squats while holding onto bar x20  Lateral lunges with 10 sec hold x10 each leg Side stepping with green theraband around ankles x 6 laps 1x walk around clinic circle for gait assessment 2x stairs with focus on knee flexion upon ascending and eccentric control with descend.  SL squats on shuttle with 75# x10  DL jumps on shuttle with 16# x 30  Pin and stretch to quad Cross friction massage to lateral gatroc and STM to gastroc  ROM in seated 122 degrees with OP Supine 121 with OP.  Pt is able to reach ~110 with active ROM in seated and supine.  Treatment                            9/17:  ROM start at 104 Foot on table- lunge mobs Qped rocking- able to decrease lat/post knee impingement with inhibition squeeze on proximal gastroc.  Trigger Point Dry Needling, Manual Therapy Treatment:  Initial or subsequent education regarding Trigger Point Dry Needling: Subsequent Did patient give consent to treatment with Trigger Point Dry Needling: Yes TPDN with skilled palpation and monitoring followed by STM to the following muscles: Lt lateral gastroc & soleus  Prone eccentric HS lower, 5lb ankle weight Seated LAQ- 5lb, finding full ext with upright through spine & pelvis Able to achieve 115 in qped at end of session Mini squats  & full squats tailbone to wall green tband at knees Hip diag press in turnout & chair pull off at bar- green tband at knees   04/18/23 Patellar mobs superior inferior - improves from 102 to 107 Mobilization with movement lunge with PA tibial glide with belt 109  following  Mobilizations grade III tibial AP glides in progressive flexion 111 following Knee extension machine 10# LLE only 3 x 10 Hamstring curl 10 # BLE down LLE up 1 x 10 Supine hamstring isometrics into red ball 10 x 10 second holds Manual fibular head AP/PA glides; STM to hamstrings, popliteal fossa region - 116 following   04/17/23 Contract relax in prone 2x 10 x 5 second holds improves to 111 Standing IT band stretch 3 x 20 second holds Deep squat at counter 2 x 10   Manual: STM to L vastus lateralis pre and post dry needling for trigger point identification and muscular relaxation. Trigger Point Dry-Needling  Treatment instructions: Expect mild to moderate muscle soreness. S/S of pneumothorax if dry needled over a lung field, and to seek immediate medical attention should they occur. Patient verbalized understanding of these instructions and education.  Patient Consent Given:  Yes Education handout provided: Previously provided Muscles treated: L vastus lateralis Electrical stimulation performed: No Parameters: N/A Treatment response/outcome: decrease in tissue tension Lateral lunge with UE support at table bilat 2x10 Cannonball/feet together squat 2x 5 with table support and heel prop Split squat with RLE on chair 1 x 10    9/12 PROM L knee Sci-fit bike for ROM L tib/fem jt mobilizations with distraction and tibial ER/IR bias STM to medial/lateral/posterior knee LAQ 5lbs 2 hold at top, AAROM flexion stretch 5s at bottom 2x10      PATIENT EDUCATION:  Education details:  anatomy, exercise progression, DOMS expectations, muscle firing,  envelope of function, HEP, POC 04/10/23: hep scar  mobilizations  Person educated: Patient Education method: Explanation, Demonstration, Tactile cues, Verbal cues, and Handouts Education comprehension: verbalized understanding, returned demonstration, verbal cues required, tactile cues required, and needs further education     HOME EXERCISE PROGRAM:  Access Code: [email or web address] URL: [email or web address]   ASSESSMENT:   CLINICAL IMPRESSION: Continued tenderness in popliteal fossa region and lateral gastroc. DN to lateral gastroc with decrease in tissue tension. Trial of soleus stretch with test restest of forward step down test with improved tension in calf but continued anterior knee tightness. Continued with quad strengthening which is tolerated well. Educated on taking a rest day from strengthening exercises but continued gentle mobility to see if symptoms/edema resolve. Patient will continue to benefit from physical therapy in order to improve function and reduce impairment.    OBJECTIVE IMPAIRMENTS decreased ROM, decreased strength, hypomobility, increased fascial restrictions, increased muscle spasms, impaired flexibility, improper body mechanics, postural dysfunction, and pain.    ACTIVITY LIMITATIONS carrying, lifting, sitting, stairs, locomotion level   PARTICIPATION LIMITATIONS: driving, shopping, community activity, occupation, and yard work   PERSONAL FACTORS Age, Time since onset of injury/illness/exacerbation, and 1 comorbidity:    are also affecting patient's functional outcome.        GOALS:   SHORT TERM GOALS: Target date: 04/02/2023     Pt will become independent with HEP in order to demonstrate synthesis of PT education.  Goal status: met   2.  Pt will be able to demonstrate at least 110 deg knee flexion  in order to demonstrate functional improvement in LE function for self-care and house hold duties.    Goal status: MET   3.  Pt will score at least 12 pt increase on FOTO to demonstrate  functional improvement in MCII and pt perceived function.     Goal status: MET       LONG TERM GOALS: Target date: 07/20/2023      Pt  will become independent with final HEP in order to demonstrate synthesis of PT education.    Goal status: ongoing   2.  Pt will be able to demonstrate/report ability to walk >30 mins without pain in order to demonstrate functional improvement and tolerance to exercise and community mobility.    Goal status: met   3.  Pt will be able to demonstrate full depth squat in order to demonstrate functional improvement in LE function for self-care and house hold duties.  ongoing  4.  Pt will score >/= 60 on FOTO to demonstrate improvement in perceived L knee function.  Baseline:  Goal status: MET  5.  Pt will be able to demonstrate full L knee ROM in order to demonstrate functional improvement in LE function for self-care and house hold duties.  ongoing   PLAN: PT FREQUENCY: 1-2x/week 12 wks  PT DURATION: POC date   PLANNED INTERVENTIONS: Therapeutic exercises, Therapeutic activity, Neuromuscular re-education, Balance training, Gait training, Patient/Family education, Self Care, Joint mobilization, Joint manipulation, Orthotic/Fit training, Aquatic Therapy, Dry Needling, Electrical stimulation, Spinal manipulation, Spinal mobilization, Cryotherapy, Moist heat, Compression bandaging, scar mobilization, Splintting, Taping, Vasopneumatic device, Traction, Ultrasound, Ionotophoresis 4mg /ml Dexamethasone, Manual therapy, and Re-evaluation   PLAN FOR NEXT SESSION: PROM into flexion, work back into there-ex as tolerated. Cupping/ DN PRN.    Reola Mosher Suetta Hoffmeister, PT 04/29/2023, 9:30 AM

## 2023-05-04 ENCOUNTER — Ambulatory Visit (HOSPITAL_BASED_OUTPATIENT_CLINIC_OR_DEPARTMENT_OTHER): Payer: HMO | Admitting: Physical Therapy

## 2023-05-04 ENCOUNTER — Encounter (HOSPITAL_BASED_OUTPATIENT_CLINIC_OR_DEPARTMENT_OTHER): Payer: Self-pay | Admitting: Physical Therapy

## 2023-05-04 DIAGNOSIS — R262 Difficulty in walking, not elsewhere classified: Secondary | ICD-10-CM | POA: Diagnosis not present

## 2023-05-04 DIAGNOSIS — M25562 Pain in left knee: Secondary | ICD-10-CM

## 2023-05-04 DIAGNOSIS — M6281 Muscle weakness (generalized): Secondary | ICD-10-CM

## 2023-05-04 NOTE — Therapy (Addendum)
OUTPATIENT PHYSICAL THERAPY LOWER EXTREMITY TREATMENT   Patient Name: Ashley Blair MRN: [id number] DOB:05/03/1956, 67 y.o., female Today's Date: 05/04/2023  END OF SESSION:  PT End of Session - 05/04/23 0806     Visit Number 19    Number of Visits 30    Date for PT Re-Evaluation 07/26/23    Authorization Type HTA    PT Start Time 0800    PT Stop Time 0840    PT Time Calculation (min) 40 min    Activity Tolerance Patient tolerated treatment well    Behavior During Therapy WFL for tasks assessed/performed                     Past Medical History:  Diagnosis Date   Anxiety    Arthritis    Elevated hemoglobin A1c 2019   5.7   Family history of DES exposure    Fibrocystic breast    GERD (gastroesophageal reflux disease)    HPV (human papilloma virus) infection    Hypercholesterolemia    OCD (obsessive compulsive disorder)    Pre-diabetes    S/P excision of lipoma    Past Surgical History:  Procedure Laterality Date   ANKLE SURGERY     broken as a child   BREAST BIOPSY Right    COLONOSCOPY     DILATION AND CURETTAGE OF UTERUS     LIPOMA EXCISION     rib cage   TOTAL KNEE ARTHROPLASTY Left 02/02/2023   Procedure: LEFT TOTAL KNEE ARTHROPLASTY;  Surgeon: Tarry Kos, MD;  Location: [facility] OR;  Service: Orthopedics;  Laterality: Left;   TRIGGER FINGER RELEASE Left 03/2013   thumb   Patient Active Problem List   Diagnosis Date Noted   Status post total left knee replacement 02/02/2023   Primary osteoarthritis of left knee 02/01/2023   Family history of breast cancer in mother 12/12/2013   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   SUBJECTIVE:   SUBJECTIVE STATEMENT: Pt states the knee is stiff this morning. She states going downstairs is still hard unless the knee is warm. The back of hte knee pain is better after TPDN and an NSAID.   PERTINENT HISTORY: N/A PAIN:  Are you having pain? No NPRS scale: 2-3/10 Pain location: posterior knee anterior knee  Pain description: "tight band feeling" Aggravating factors: bending Relieving factors: N/A, ice/rest   PRECAUTIONS: Knee  RED FLAGS: None   WEIGHT BEARING RESTRICTIONS: No  FALLS:  Has patient fallen in last 6 months? No  LIVING ENVIRONMENT: Lives with: lives with their family and lives with their spouse Lives in: House/apartment Stairs: Yes Has following equipment at home: Single point cane  OCCUPATION: Marketing executive; Office manager  PLOF: Independent  PATIENT GOALS: return to normal exercise    OBJECTIVE:   DIAGNOSTIC FINDINGS: N/A  PATIENT SURVEYS:  FOTO 39 60 @ DC 12 MCII  FOTO 72% (GOAL MET 9/23)   COGNITION: Overall cognitive status: Within functional limits for tasks  assessed     SENSATION: WFL  EDEMA:  Moderate, palpable edema anteriorly and posteriorly into popliteal fossa  LOWER EXTREMITY ROM:  Active ROM Right eval Left eval L 7/20 L 7/24 Lt 8/5 Lt  8/5 8/19 8/30 9/3 04/08/23 04/10/23 04/14/23 9/11 04/17/23 04/18/23 04/27/23 04/28/23 9/30  Hip flexion                    Hip extension                    Hip abduction                    Hip adduction                    Hip internal rotation                    Hip external rotation                    Knee flexion 125 75 78 86 95 91 101 CKC  107 105 90 at beginning of session, improves to 110 90 Improves to  117 92 at beginning of session, improves to 115 90  116 at end of session 101 102  116 at end of session 117 108  at beginning of session 122  Knee extension 3 -12 -5   1 0 0        0 0 0  Ankle dorsiflexion                    Ankle plantarflexion                    Ankle inversion                    Ankle eversion                     (Blank rows = not tested)  MMT (HHD in lbs)  Right 9/23 Left 9/23  Hip flexion    Hip extension    Hip abduction    Hip adduction    Hip internal rotation    Hip external rotation    Knee flexion 38.3, 38.8, 38.2 27.6, 33.5, 31.1  Knee extension 39.5, 43.6, 46.8 39.9, 42.1, 41.1  Ankle dorsiflexion    Ankle plantarflexion    Ankle inversion    Ankle eversion     (Blank rows = not tested)   TODAY'S TREATMENT:   9/30  Starting 115; Ending 122  Recumbent bike warm up 5 min   Grade IV tibial IR/ER bias flexion mob   Knee ext machine single leg 15lbs 3x5 (intermittent R LE assist)  Bosu step up 3x10  Standing gastroc/soleus stretch 30s 2x each  Deep cannonball squat with Ue assist 2x10 Standing SLS with star reach 3x5 Prone HS curl 5lbs 3x8   04/29/23 Manual: STM to L lateral gastroc pre and post dry needling for trigger point identification and muscular relaxation.  Trigger Point Dry-Needling  Treatment instructions: Expect mild to moderate muscle soreness. S/S of pneumothorax if dry needled over a lung field, and to seek immediate medical attention should they occur. Patient verbalized understanding of these instructions and education.   Patient Consent Given: Yes Education (verbally/handout)provided: Yes Muscles treated: L lateral gastroc and L lateral HS Electrical stimulation performed: none Parameters:   n/a Treatment response/outcome: several large LTR  elicited; palpable increase in soft tissue extensibility Standing soleus stretch 3 x 20 second holds Cannonball/feet together squat  3x8 with table support and heel prop Shuttle SL leg press 50lbs 3x10 Split squat with RLE on table and unilateral UE support 2 x 10    9/23  Trigger Point  Dry-Needling  Treatment instructions: Expect mild to moderate muscle soreness. S/S of pneumothorax if dry needled over a lung field, and to seek immediate medical attention should they occur. Patient verbalized understanding of these instructions and education.   Patient Consent Given: Yes Education (verbally/handout)provided: Yes Muscles treated: L lateral gastroc and L lateral HS Electrical stimulation performed: none Parameters:   n/a Treatment response/outcome: several large LTR elicited; palpable increase in soft tissue extensibility  STM L gastroc and HS Cannonball/feet together squat  3x8 with table support and heel prop  Recovery, exercise progression, healing timeline, self STM    Starting 109;  9/18 Starting 111; 116 after manual  STM L adductor and medial HS group L knee Grade IV tibial AP glides in progressive flexion with tibial IR and ER  Self STM of adductor and HS with foam roller  Shuttle SL leg press 50lbs 3x6  Cannonball/feet together squat  3x8 with table support and heel prop  Treatment                            9/21:  Bike for 8 min while PT present for subjective reports.  Deep squats while holding onto bar x20  Lateral lunges with 10 sec hold x10 each leg Side stepping with green theraband around ankles x 6 laps 1x walk around clinic circle for gait assessment 2x stairs with focus on knee flexion upon ascending and eccentric control with descend.  SL squats on shuttle with 75# x10  DL jumps on shuttle with 16# x 30  Pin and stretch to quad Cross friction massage to lateral gatroc and STM to gastroc  ROM in seated 122 degrees with OP Supine 121 with OP.  Pt is able to reach ~110 with active ROM in seated and supine.  Treatment                            9/17:  ROM start at 104 Foot on table- lunge mobs Qped rocking- able to decrease lat/post knee impingement with inhibition squeeze on proximal gastroc.  Trigger Point Dry Needling, Manual Therapy  Treatment:  Initial or subsequent education regarding Trigger Point Dry Needling: Subsequent Did patient give consent to treatment with Trigger Point Dry Needling: Yes TPDN with skilled palpation and monitoring followed by STM to the following muscles: Lt lateral gastroc & soleus  Prone eccentric HS lower, 5lb ankle weight Seated LAQ- 5lb, finding full ext with upright through spine & pelvis Able to achieve 115 in qped at end of session Mini squats & full squats tailbone to wall green tband at knees Hip diag press in turnout & chair pull off at bar- green tband at knees   04/18/23 Patellar mobs superior inferior - improves from 102 to 107 Mobilization with movement lunge with PA tibial glide with belt 109  following  Mobilizations grade III tibial AP glides in progressive flexion 111 following Knee extension machine 10# LLE only 3 x 10 Hamstring curl 10 # BLE down LLE up 1 x 10 Supine hamstring isometrics into red ball 10 x 10 second holds Manual fibular head  AP/PA glides; STM to hamstrings, popliteal fossa region - 116 following   04/17/23 Contract relax in prone 2x 10 x 5 second holds improves to 111 Standing IT band stretch 3 x 20 second holds Deep squat at counter 2 x 10   Manual: STM to L vastus lateralis pre and post dry needling for trigger point identification and muscular relaxation. Trigger Point Dry-Needling  Treatment instructions: Expect mild to moderate muscle soreness. S/S of pneumothorax if dry needled over a lung field, and to seek immediate medical attention should they occur. Patient verbalized understanding of these instructions and education.  Patient Consent Given: Yes Education handout provided: Previously provided Muscles treated: L vastus lateralis Electrical stimulation performed: No Parameters: N/A Treatment response/outcome: decrease in tissue tension Lateral lunge with UE support at table bilat 2x10 Cannonball/feet together squat 2x 5 with table support  and heel prop Split squat with RLE on chair 1 x 10    9/12 PROM L knee Sci-fit bike for ROM L tib/fem jt mobilizations with distraction and tibial ER/IR bias STM to medial/lateral/posterior knee LAQ 5lbs 2 hold at top, AAROM flexion stretch 5s at bottom 2x10      PATIENT EDUCATION:  Education details:  anatomy, exercise progression, DOMS expectations, muscle firing,  envelope of function, HEP, POC 04/10/23: hep scar mobilizations  Person educated: Patient Education method: Explanation, Demonstration, Tactile cues, Verbal cues, and Handouts Education comprehension: verbalized understanding, returned demonstration, verbal cues required, tactile cues required, and needs further education     HOME EXERCISE PROGRAM:  Access Code: [email or web address] URL: [email or web address]   ASSESSMENT:   CLINICAL IMPRESSION: Pt with decreased L posterior knee tenderness and pain today and was able to progress to progressive flexion and L LE knee ext exercise today. Pt does have expected L quadriceps weakness into isolated concentric and eccentric knee ext. Pt knee flexion continues to improve week by week as demonstrated by ability to reach 122 flexion and 0 deg ext today. Pt does have improved closed chain quad strength. However, descending stairs with reciprocal pattern continues to be challenging without adequate warm up. Plan to ocntinue with knee motion and single LE quad strength at next. OKC and CKC.   Pt would benefit from continued skilled therapy in order to reach goals and maximize functional  L LE  strength and ROM for full return to PLOF.     OBJECTIVE IMPAIRMENTS decreased ROM, decreased strength, hypomobility, increased fascial restrictions, increased muscle spasms, impaired flexibility, improper body mechanics, postural dysfunction, and pain.    ACTIVITY LIMITATIONS carrying, lifting, sitting, stairs, locomotion level   PARTICIPATION LIMITATIONS: driving,  shopping, community activity, occupation, and yard work   PERSONAL FACTORS Age, Time since onset of injury/illness/exacerbation, and 1 comorbidity:    are also affecting patient's functional outcome.        GOALS:   SHORT TERM GOALS: Target date: 04/02/2023     Pt will become independent with HEP in order to demonstrate synthesis of PT education.  Goal status: met   2.  Pt will be able to demonstrate at least 110 deg knee flexion  in order to demonstrate functional improvement in LE function for self-care and house hold duties.    Goal status: MET   3.  Pt will score at least 12 pt increase on FOTO to demonstrate functional improvement in MCII and pt perceived function.     Goal status: MET       LONG TERM GOALS: Target date: 07/20/2023  Pt  will become independent with final HEP in order to demonstrate synthesis of PT education.    Goal status: ongoing   2.  Pt will be able to demonstrate/report ability to walk >30 mins without pain in order to demonstrate functional improvement and tolerance to exercise and community mobility.    Goal status: met   3.  Pt will be able to demonstrate full depth squat in order to demonstrate functional improvement in LE function for self-care and house hold duties.  ongoing  4.  Pt will score >/= 60 on FOTO to demonstrate improvement in perceived L knee function.  Baseline:  Goal status: MET  5.  Pt will be able to demonstrate full L knee ROM in order to demonstrate functional improvement in LE function for self-care and house hold duties.  ongoing   PLAN: PT FREQUENCY: 1-2x/week 12 wks   PT DURATION: POC date   PLANNED INTERVENTIONS: Therapeutic exercises, Therapeutic activity, Neuromuscular re-education, Balance training, Gait training, Patient/Family education, Self Care, Joint mobilization, Joint manipulation, Orthotic/Fit training, Aquatic Therapy, Dry Needling, Electrical stimulation, Spinal manipulation, Spinal  mobilization, Cryotherapy, Moist heat, Compression bandaging, scar mobilization, Splintting, Taping, Vasopneumatic device, Traction, Ultrasound, Ionotophoresis 4mg /ml Dexamethasone, Manual therapy, and Re-evaluation   PLAN FOR NEXT SESSION: PROM into flexion, work back into there-ex as tolerated. Cupping/ DN PRN.    Zebedee Iba, PT 05/04/2023, 8:46 AM

## 2023-05-07 ENCOUNTER — Ambulatory Visit: Payer: HMO | Admitting: Orthopaedic Surgery

## 2023-05-07 ENCOUNTER — Encounter: Payer: Self-pay | Admitting: Orthopaedic Surgery

## 2023-05-07 DIAGNOSIS — Z96652 Presence of left artificial knee joint: Secondary | ICD-10-CM

## 2023-05-07 NOTE — Progress Notes (Signed)
Post-Op Visit Note   Patient: Ashley Blair           Date of Birth: 1956-01-31           MRN: [id number] Visit Date: 05/07/2023 PCP: Thana Ates, MD   Assessment & Plan:  Chief Complaint:  Chief Complaint  Patient presents with   Left Knee - Follow-up    Manipulation 04/02/2023   Visit Diagnoses:  1. Status post total left knee replacement     Plan: [name] is a 3 months status post left total knee replacement and manipulation.  She is doing physical therapy twice a week.  Overall she is making good progress.  She is having a much easier time with ADLs.  The only problem she has is coming down the stairs.  Overall she is happy with her progress.  Exam of the left knee shows fully healed surgical scar.  Range of motion is progressing nicely.  She lacks a few degrees of full extension.  Collaterals are stable.  No signs of infection.  I am happy with Peggy's progress overall.  She is very motivated and dedicated with her physical therapy and rehab.  Will see her back in 3 months with repeat x-rays of the left knee.  Follow-Up Instructions: No follow-ups on file.   Orders:  No orders of the defined types were placed in this encounter.  No orders of the defined types were placed in this encounter.   Imaging: No results found.  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 12/12/2013   Past Medical History:  Diagnosis Date   Anxiety    Arthritis    Elevated hemoglobin A1c 2019   5.7   Family history of DES exposure    Fibrocystic breast    GERD (gastroesophageal reflux disease)    HPV (human papilloma virus) infection    Hypercholesterolemia    OCD (obsessive compulsive disorder)    Pre-diabetes    S/P excision of lipoma     Family  History  Problem Relation Age of Onset   Breast cancer Mother    Osteoporosis Maternal Grandmother    Stroke Maternal Grandfather     Past Surgical History:  Procedure Laterality Date   ANKLE SURGERY     broken as a child   BREAST BIOPSY Right    COLONOSCOPY     DILATION AND CURETTAGE OF UTERUS     LIPOMA EXCISION     rib cage   TOTAL KNEE ARTHROPLASTY Left 02/02/2023   Procedure: LEFT TOTAL KNEE ARTHROPLASTY;  Surgeon: Tarry Kos, MD;  Location: [facility] OR;  Service: Orthopedics;  Laterality: Left;   TRIGGER FINGER RELEASE Left 03/2013   thumb   Social History   Occupational History   Not on file  Tobacco Use   Smoking status: Former    Current packs/day: 0.00    Types: Cigarettes    Quit date: 12/07/1975    Years since quitting: 47.4   Smokeless tobacco: Never  Vaping Use   Vaping status: Never Used  Substance and Sexual Activity   Alcohol use: Not Currently    Comment: rarely   Drug use: No   Sexual activity: Not Currently    Partners: Male    Birth control/protection: Post-menopausal

## 2023-05-08 ENCOUNTER — Encounter (HOSPITAL_BASED_OUTPATIENT_CLINIC_OR_DEPARTMENT_OTHER): Payer: Self-pay

## 2023-05-08 ENCOUNTER — Other Ambulatory Visit (HOSPITAL_BASED_OUTPATIENT_CLINIC_OR_DEPARTMENT_OTHER): Payer: Self-pay

## 2023-05-08 ENCOUNTER — Ambulatory Visit (HOSPITAL_BASED_OUTPATIENT_CLINIC_OR_DEPARTMENT_OTHER): Payer: HMO | Attending: Orthopaedic Surgery

## 2023-05-08 DIAGNOSIS — M25562 Pain in left knee: Secondary | ICD-10-CM | POA: Diagnosis not present

## 2023-05-08 DIAGNOSIS — R262 Difficulty in walking, not elsewhere classified: Secondary | ICD-10-CM | POA: Insufficient documentation

## 2023-05-08 DIAGNOSIS — M6281 Muscle weakness (generalized): Secondary | ICD-10-CM | POA: Insufficient documentation

## 2023-05-08 MED ORDER — INFLUENZA VAC A&B SURF ANT ADJ 0.5 ML IM SUSY
0.5000 mL | PREFILLED_SYRINGE | Freq: Once | INTRAMUSCULAR | 0 refills | Status: AC
  Filled 2023-05-08: qty 0.5, 1d supply, fill #0

## 2023-05-08 NOTE — Therapy (Signed)
OUTPATIENT PHYSICAL THERAPY LOWER EXTREMITY TREATMENT   Patient Name: Ashley Blair MRN: [id number] DOB:04-22-1956, 67 y.o., female Today's Date: 05/08/2023  END OF SESSION:  PT End of Session - 05/08/23 1054     Visit Number 20    Number of Visits 30    Date for PT Re-Evaluation 07/26/23    Authorization Type HTA    PT Start Time 1015    PT Stop Time 1100    PT Time Calculation (min) 45 min    Activity Tolerance Patient tolerated treatment well    Behavior During Therapy WFL for tasks assessed/performed                      Past Medical History:  Diagnosis Date   Anxiety    Arthritis    Elevated hemoglobin A1c 2019   5.7   Family history of DES exposure    Fibrocystic breast    GERD (gastroesophageal reflux disease)    HPV (human papilloma virus) infection    Hypercholesterolemia    OCD (obsessive compulsive disorder)    Pre-diabetes    S/P excision of lipoma    Past Surgical History:  Procedure Laterality Date   ANKLE SURGERY     broken as a child   BREAST BIOPSY Right    COLONOSCOPY     DILATION AND CURETTAGE OF UTERUS     LIPOMA EXCISION     rib cage   TOTAL KNEE ARTHROPLASTY Left 02/02/2023   Procedure: LEFT TOTAL KNEE ARTHROPLASTY;  Surgeon: Tarry Kos, MD;  Location: [facility] OR;  Service: Orthopedics;  Laterality: Left;   TRIGGER FINGER RELEASE Left 03/2013   thumb   Patient Active Problem List   Diagnosis Date Noted   Status post total left knee replacement 02/02/2023   Primary osteoarthritis of left knee 02/01/2023   Family history of breast cancer in mother 12/12/2013   H/O diethylstilbestrol (DES) exposure in utero 12/12/2013    PCP:  Thana Ates, MD       REFERRING PROVIDER:  Tarry Kos, MD     REFERRING DIAG:  [phone] (ICD-10-CM) - Primary osteoarthritis of left knee      THERAPY DIAG:  Difficulty walking  Acute pain of left knee  Muscle weakness (generalized)  Rationale for Evaluation and Treatment:  Rehabilitation  ONSET DATE: 02/02/23 DOS Manipulation 8/29    Days since surgery: 53   Operative procedure: Left total knee arthroplasty.   SUBJECTIVE:   SUBJECTIVE STATEMENT: Pt reports she can go down the stairs, but it still doesn't feel quite normal yet. Some tightness with donning socks.   PERTINENT HISTORY: N/A PAIN:  Are you having pain? No NPRS scale: 2-3/10 Pain location: posterior knee anterior knee  Pain description: "tight band feeling" Aggravating factors: bending Relieving factors: N/A, ice/rest   PRECAUTIONS: Knee  RED FLAGS: None   WEIGHT BEARING RESTRICTIONS: No  FALLS:  Has patient fallen in last 6 months? No  LIVING ENVIRONMENT: Lives with: lives with their family and lives with their spouse Lives in: House/apartment Stairs: Yes Has following equipment at home: Single point cane  OCCUPATION: Marketing executive; Office manager  PLOF: Independent  PATIENT GOALS: return to normal exercise    OBJECTIVE:   DIAGNOSTIC FINDINGS: N/A  PATIENT SURVEYS:  FOTO 39 60 @ DC 12 MCII  FOTO 72% (GOAL MET 9/23)   COGNITION: Overall cognitive status: Within functional limits for tasks assessed     SENSATION: WFL  EDEMA:  Moderate,  palpable edema anteriorly and posteriorly into popliteal fossa  LOWER EXTREMITY ROM:  Active ROM Right eval Left eval L 7/20 L 7/24 Lt 8/5 Lt  8/5 8/19 8/30 9/3 04/08/23 04/10/23 04/14/23 9/11 04/17/23 04/18/23 04/27/23 04/28/23 9/30  Hip flexion                    Hip extension                    Hip abduction                    Hip adduction                    Hip internal rotation                    Hip external rotation                    Knee flexion 125 75 78 86 95 91 101 CKC  107 105 90 at beginning of session, improves to 110 90 Improves to  117 92 at beginning of session, improves to 115 90  116 at end of session 101 102  116 at end of session 117 108 at beginning of session 122  Knee extension 3 -12  -5   1 0 0        0 0 0  Ankle dorsiflexion                    Ankle plantarflexion                    Ankle inversion                    Ankle eversion                     (Blank rows = not tested)  MMT (HHD in lbs)  Right 9/23 Left 9/23  Hip flexion    Hip extension    Hip abduction    Hip adduction    Hip internal rotation    Hip external rotation    Knee flexion 38.3, 38.8, 38.2 27.6, 33.5, 31.1  Knee extension 39.5, 43.6, 46.8 39.9, 42.1, 41.1  Ankle dorsiflexion    Ankle plantarflexion    Ankle inversion    Ankle eversion     (Blank rows = not tested)   TODAY'S TREATMENT:    10/4:   Recumbent bike warm up 6 min  Incline stretch PROM L knee  Eccentric step downs fwd and lateral Step up with march- 4"+airex Retro eccentric step down   Knee ext machine single leg 15lbs 3x5 (intermittent R LE assist)  Bosu step up 3x10  Standing SLS with star reach 3x5 Prone HS curl 5lbs 3x8   9/30  Starting 115; Ending 122  Recumbent bike warm up 5 min   Grade IV tibial IR/ER bias flexion mob   Knee ext machine single leg 15lbs 3x5 (intermittent R LE assist)  Bosu step up 3x10  Standing gastroc/soleus stretch 30s 2x each  Deep cannonball squat with Ue assist 2x10 Standing SLS with star reach 3x5 Prone HS curl 5lbs 3x8   04/29/23 Manual: STM to L lateral gastroc pre and post dry needling for trigger point identification and muscular relaxation.  Trigger Point Dry-Needling  Treatment instructions: Expect mild to moderate muscle soreness. S/S of pneumothorax if dry  needled over a lung field, and to seek immediate medical attention should they occur. Patient verbalized understanding of these instructions and education.   Patient Consent Given: Yes Education (verbally/handout)provided: Yes Muscles treated: L lateral gastroc and L lateral HS Electrical stimulation performed: none Parameters:   n/a Treatment response/outcome: several large LTR elicited; palpable  increase in soft tissue extensibility Standing soleus stretch 3 x 20 second holds Cannonball/feet together squat  3x8 with table support and heel prop Shuttle SL leg press 50lbs 3x10 Split squat with RLE on table and unilateral UE support 2 x 10    9/23  Trigger Point Dry-Needling  Treatment instructions: Expect mild to moderate muscle soreness. S/S of pneumothorax if dry needled over a lung field, and to seek immediate medical attention should they occur. Patient verbalized understanding of these instructions and education.   Patient Consent Given: Yes Education (verbally/handout)provided: Yes Muscles treated: L lateral gastroc and L lateral HS Electrical stimulation performed: none Parameters:   n/a Treatment response/outcome: several large LTR elicited; palpable increase in soft tissue extensibility  STM L gastroc and HS Cannonball/feet together squat  3x8 with table support and heel prop  Recovery, exercise progression, healing timeline, self STM    Starting 109;  9/18 Starting 111; 116 after manual  STM L adductor and medial HS group L knee Grade IV tibial AP glides in progressive flexion with tibial IR and ER  Self STM of adductor and HS with foam roller  Shuttle SL leg press 50lbs 3x6  Cannonball/feet together squat  3x8 with table support and heel prop       PATIENT EDUCATION:  Education details:  anatomy, exercise progression, DOMS expectations, muscle firing,  envelope of function, HEP, POC 04/10/23: hep scar mobilizations  Person educated: Patient Education method: Explanation, Demonstration, Tactile cues, Verbal cues, and Handouts Education comprehension: verbalized understanding, returned demonstration, verbal cues required, tactile cues required, and needs further education     HOME EXERCISE PROGRAM:  Access Code: [email or web address] URL: [email or web address]   ASSESSMENT:   CLINICAL IMPRESSION: Remains very tender and limited in  flexion by tight area of posterior knee. Spent time on manual techniques to this area to improve. Active knee extension appears similar to contralateral LE. Worked on eccentric focused strengthening today with some difficulty, though minimal discomfort reported. Condensed HEP and added eccentric exercises.      OBJECTIVE IMPAIRMENTS decreased ROM, decreased strength, hypomobility, increased fascial restrictions, increased muscle spasms, impaired flexibility, improper body mechanics, postural dysfunction, and pain.    ACTIVITY LIMITATIONS carrying, lifting, sitting, stairs, locomotion level   PARTICIPATION LIMITATIONS: driving, shopping, community activity, occupation, and yard work   PERSONAL FACTORS Age, Time since onset of injury/illness/exacerbation, and 1 comorbidity:    are also affecting patient's functional outcome.        GOALS:   SHORT TERM GOALS: Target date: 04/02/2023     Pt will become independent with HEP in order to demonstrate synthesis of PT education.  Goal status: met   2.  Pt will be able to demonstrate at least 110 deg knee flexion  in order to demonstrate functional improvement in LE function for self-care and house hold duties.    Goal status: MET   3.  Pt will score at least 12 pt increase on FOTO to demonstrate functional improvement in MCII and pt perceived function.     Goal status: MET       LONG TERM GOALS: Target date: 07/20/2023      Pt  will  become independent with final HEP in order to demonstrate synthesis of PT education.    Goal status: ongoing   2.  Pt will be able to demonstrate/report ability to walk >30 mins without pain in order to demonstrate functional improvement and tolerance to exercise and community mobility.    Goal status: met   3.  Pt will be able to demonstrate full depth squat in order to demonstrate functional improvement in LE function for self-care and house hold duties.  ongoing  4.  Pt will score >/= 60 on FOTO  to demonstrate improvement in perceived L knee function.  Baseline:  Goal status: MET  5.  Pt will be able to demonstrate full L knee ROM in order to demonstrate functional improvement in LE function for self-care and house hold duties.  ongoing   PLAN: PT FREQUENCY: 1-2x/week 12 wks   PT DURATION: POC date   PLANNED INTERVENTIONS: Therapeutic exercises, Therapeutic activity, Neuromuscular re-education, Balance training, Gait training, Patient/Family education, Self Care, Joint mobilization, Joint manipulation, Orthotic/Fit training, Aquatic Therapy, Dry Needling, Electrical stimulation, Spinal manipulation, Spinal mobilization, Cryotherapy, Moist heat, Compression bandaging, scar mobilization, Splintting, Taping, Vasopneumatic device, Traction, Ultrasound, Ionotophoresis 4mg /ml Dexamethasone, Manual therapy, and Re-evaluation   PLAN FOR NEXT SESSION: PROM into flexion, work back into there-ex as tolerated. Cupping/ DN PRN.    Donnel Saxon Talyn Eddie, PTA 05/08/2023, 11:48 AM

## 2023-05-11 ENCOUNTER — Encounter (HOSPITAL_BASED_OUTPATIENT_CLINIC_OR_DEPARTMENT_OTHER): Payer: Self-pay | Admitting: Physical Therapy

## 2023-05-11 ENCOUNTER — Ambulatory Visit (HOSPITAL_BASED_OUTPATIENT_CLINIC_OR_DEPARTMENT_OTHER): Payer: HMO | Admitting: Physical Therapy

## 2023-05-11 DIAGNOSIS — R262 Difficulty in walking, not elsewhere classified: Secondary | ICD-10-CM

## 2023-05-11 DIAGNOSIS — M25562 Pain in left knee: Secondary | ICD-10-CM

## 2023-05-11 DIAGNOSIS — M6281 Muscle weakness (generalized): Secondary | ICD-10-CM

## 2023-05-11 NOTE — Therapy (Signed)
OUTPATIENT PHYSICAL THERAPY LOWER EXTREMITY TREATMENT   Patient Name: Ashley Blair MRN: [id number] DOB:01-24-1956, 67 y.o., female Today's Date: 05/11/2023  END OF SESSION:  PT End of Session - 05/11/23 0805     Visit Number 21    Number of Visits 30    Date for PT Re-Evaluation 07/26/23    Authorization Type HTA    PT Start Time 0801    PT Stop Time 0841    PT Time Calculation (min) 40 min    Activity Tolerance Patient tolerated treatment well    Behavior During Therapy [facility] for tasks assessed/performed                       Past Medical History:  Diagnosis Date   Anxiety    Arthritis    Elevated hemoglobin A1c 2019   5.7   Family history of DES exposure    Fibrocystic breast    GERD (gastroesophageal reflux disease)    HPV (human papilloma virus) infection    Hypercholesterolemia    OCD (obsessive compulsive disorder)    Pre-diabetes    S/P excision of lipoma    Past Surgical History:  Procedure Laterality Date   ANKLE SURGERY     broken as a child   BREAST BIOPSY Right    COLONOSCOPY     DILATION AND CURETTAGE OF UTERUS     LIPOMA EXCISION     rib cage   TOTAL KNEE ARTHROPLASTY Left 02/02/2023   Procedure: LEFT TOTAL KNEE ARTHROPLASTY;  Surgeon: Tarry Kos, MD;  Location: [facility] OR;  Service: Orthopedics;  Laterality: Left;   TRIGGER FINGER RELEASE Left 03/2013   thumb   Patient Active Problem List   Diagnosis Date Noted   Status post total left knee replacement 02/02/2023   Primary osteoarthritis of left knee 02/01/2023   Family history of breast cancer in mother 12/12/2013   H/O diethylstilbestrol (DES) exposure in utero 12/12/2013    PCP:  Thana Ates, MD       REFERRING PROVIDER:  Tarry Kos, MD     REFERRING DIAG:  [phone] (ICD-10-CM) - Primary osteoarthritis of left knee      THERAPY DIAG:  Difficulty walking  Acute pain of left knee  Muscle weakness (generalized)  Rationale for Evaluation and Treatment:  Rehabilitation  ONSET DATE: 02/02/23 DOS Manipulation 8/29    Days since surgery: 53   Operative procedure: Left total knee arthroplasty.   SUBJECTIVE:   SUBJECTIVE STATEMENT: Pt states that the knee going downstairs is still difficult but the knee overall continues to improve. She is walking the dog now but the band of stiffness is still in the front of the knee.   PERTINENT HISTORY: N/A PAIN:  Are you having pain? No NPRS scale: 2-3/10 Pain location: posterior knee anterior knee  Pain description: "tight band feeling" Aggravating factors: bending Relieving factors: N/A, ice/rest   PRECAUTIONS: Knee  RED FLAGS: None   WEIGHT BEARING RESTRICTIONS: No  FALLS:  Has patient fallen in last 6 months? No  LIVING ENVIRONMENT: Lives with: lives with their family and lives with their spouse Lives in: House/apartment Stairs: Yes Has following equipment at home: Single point cane  OCCUPATION: Marketing executive; Office manager  PLOF: Independent  PATIENT GOALS: return to normal exercise    OBJECTIVE:   DIAGNOSTIC FINDINGS: N/A  PATIENT SURVEYS:  FOTO 39 60 @ DC 12 MCII  FOTO 72% (GOAL MET 9/23)   COGNITION: Overall cognitive status:  Within functional limits for tasks assessed     SENSATION: [facility]  EDEMA:  Moderate, palpable edema anteriorly and posteriorly into popliteal fossa  LOWER EXTREMITY ROM:  Active ROM Right eval Left eval L 7/20 L 7/24 Lt 8/5 Lt  8/5 8/19 8/30 9/3 04/08/23 04/10/23 04/14/23 9/11 04/17/23 04/18/23 04/27/23 04/28/23 9/30  Hip flexion                    Hip extension                    Hip abduction                    Hip adduction                    Hip internal rotation                    Hip external rotation                    Knee flexion 125 75 78 86 95 91 101 CKC  107 105 90 at beginning of session, improves to 110 90 Improves to  117 92 at beginning of session, improves to 115 90  116 at end of session 101 102  116 at  end of session 117 108 at beginning of session 122  Knee extension 3 -12 -5   1 0 0        0 0 0  Ankle dorsiflexion                    Ankle plantarflexion                    Ankle inversion                    Ankle eversion                     (Blank rows = not tested)  MMT (HHD in lbs)  Right 9/23 Left 9/23  Hip flexion    Hip extension    Hip abduction    Hip adduction    Hip internal rotation    Hip external rotation    Knee flexion 38.3, 38.8, 38.2 27.6, 33.5, 31.1  Knee extension 39.5, 43.6, 46.8 39.9, 42.1, 41.1  Ankle dorsiflexion    Ankle plantarflexion    Ankle inversion    Ankle eversion     (Blank rows = not tested)   TODAY'S TREATMENT:   10/7   Recumbent bike warm up 6 min   Cannonball squat 2x10  8" box runners step up 3x8 fwd and lateral Machine calf raise 2x12 (knee straight) leg press 30lbs  SL leg press 15lbs 3x5 gray machine  SLS on airex with 5lb weight pass 3x10  10lb staggered RDL 3x12  Manual eccentric resisted knee ext and flexion 3x10     10/4:   Recumbent bike warm up 6 min  Incline stretch PROM L knee  Eccentric step downs fwd and lateral Step up with march- 4"+airex Retro eccentric step down   Knee ext machine single leg 15lbs 3x5 (intermittent R LE assist)  Bosu step up 3x10  Standing SLS with star reach 3x5 Prone HS curl 5lbs 3x8   9/30  Starting 115; Ending 122  Recumbent bike warm up 5 min   Grade IV tibial IR/ER bias flexion mob  Knee ext machine single leg 15lbs 3x5 (intermittent R LE assist)  Bosu step up 3x10  Standing gastroc/soleus stretch 30s 2x each  Deep cannonball squat with Ue assist 2x10 Standing SLS with star reach 3x5 Prone HS curl 5lbs 3x8   04/29/23 Manual: STM to L lateral gastroc pre and post dry needling for trigger point identification and muscular relaxation.  Trigger Point Dry-Needling  Treatment instructions: Expect mild to moderate muscle soreness. S/S of pneumothorax if dry  needled over a lung field, and to seek immediate medical attention should they occur. Patient verbalized understanding of these instructions and education.   Patient Consent Given: Yes Education (verbally/handout)provided: Yes Muscles treated: L lateral gastroc and L lateral HS Electrical stimulation performed: none Parameters:   n/a Treatment response/outcome: several large LTR elicited; palpable increase in soft tissue extensibility Standing soleus stretch 3 x 20 second holds Cannonball/feet together squat  3x8 with table support and heel prop Shuttle SL leg press 50lbs 3x10 Split squat with RLE on table and unilateral UE support 2 x 10    9/23  Trigger Point Dry-Needling  Treatment instructions: Expect mild to moderate muscle soreness. S/S of pneumothorax if dry needled over a lung field, and to seek immediate medical attention should they occur. Patient verbalized understanding of these instructions and education.   Patient Consent Given: Yes Education (verbally/handout)provided: Yes Muscles treated: L lateral gastroc and L lateral HS Electrical stimulation performed: none Parameters:   n/a Treatment response/outcome: several large LTR elicited; palpable increase in soft tissue extensibility  STM L gastroc and HS Cannonball/feet together squat  3x8 with table support and heel prop  Recovery, exercise progression, healing timeline, self STM    Starting 109;  9/18 Starting 111; 116 after manual  STM L adductor and medial HS group L knee Grade IV tibial AP glides in progressive flexion with tibial IR and ER  Self STM of adductor and HS with foam roller  Shuttle SL leg press 50lbs 3x6  Cannonball/feet together squat  3x8 with table support and heel prop       PATIENT EDUCATION:  Education details:  anatomy, exercise progression, DOMS expectations, muscle firing,  envelope of function, HEP, POC 04/10/23: hep scar mobilizations  Person educated: Patient Education  method: Explanation, Demonstration, Tactile cues, Verbal cues, and Handouts Education comprehension: verbalized understanding, returned demonstration, verbal cues required, tactile cues required, and needs further education     HOME EXERCISE PROGRAM:  Access Code: [email or web address] URL: [email or web address]   ASSESSMENT:   CLINICAL IMPRESSION: Pt exercise progressed today with focus on eccentric strength in both OKC and CKC. Mild irritation to lateral knee into popliteal space that continues to be related to HS and gastroc irritation but improved with mid range holds. Pt demo's difficulty with SLS on pliant surface but improves with repetition. Plan to continue with functional movement strengthening for return to normal exercise and ADL. Pt would benefit from continued skilled therapy in order to reach goals and maximize functional L LE strength and ROM for return to PLOF.    OBJECTIVE IMPAIRMENTS decreased ROM, decreased strength, hypomobility, increased fascial restrictions, increased muscle spasms, impaired flexibility, improper body mechanics, postural dysfunction, and pain.    ACTIVITY LIMITATIONS carrying, lifting, sitting, stairs, locomotion level   PARTICIPATION LIMITATIONS: driving, shopping, community activity, occupation, and yard work   PERSONAL FACTORS Age, Time since onset of injury/illness/exacerbation, and 1 comorbidity:    are also affecting patient's functional outcome.        GOALS:  SHORT TERM GOALS: Target date: 04/02/2023     Pt will become independent with HEP in order to demonstrate synthesis of PT education.  Goal status: met   2.  Pt will be able to demonstrate at least 110 deg knee flexion  in order to demonstrate functional improvement in LE function for self-care and house hold duties.    Goal status: MET   3.  Pt will score at least 12 pt increase on FOTO to demonstrate functional improvement in MCII and pt perceived function.      Goal status: MET       LONG TERM GOALS: Target date: 07/20/2023      Pt  will become independent with final HEP in order to demonstrate synthesis of PT education.    Goal status: ongoing   2.  Pt will be able to demonstrate/report ability to walk >30 mins without pain in order to demonstrate functional improvement and tolerance to exercise and community mobility.    Goal status: met   3.  Pt will be able to demonstrate full depth squat in order to demonstrate functional improvement in LE function for self-care and house hold duties.  ongoing  4.  Pt will score >/= 60 on FOTO to demonstrate improvement in perceived L knee function.  Baseline:  Goal status: MET  5.  Pt will be able to demonstrate full L knee ROM in order to demonstrate functional improvement in LE function for self-care and house hold duties.  ongoing   PLAN: PT FREQUENCY: 1-2x/week 12 wks   PT DURATION: POC date   PLANNED INTERVENTIONS: Therapeutic exercises, Therapeutic activity, Neuromuscular re-education, Balance training, Gait training, Patient/Family education, Self Care, Joint mobilization, Joint manipulation, Orthotic/Fit training, Aquatic Therapy, Dry Needling, Electrical stimulation, Spinal manipulation, Spinal mobilization, Cryotherapy, Moist heat, Compression bandaging, scar mobilization, Splintting, Taping, Vasopneumatic device, Traction, Ultrasound, Ionotophoresis 4mg /ml Dexamethasone, Manual therapy, and Re-evaluation   PLAN FOR NEXT SESSION: PROM into flexion, work back into there-ex as tolerated. Cupping/ DN PRN.    Zebedee Iba, PT 05/11/2023, 8:43 AM

## 2023-05-13 ENCOUNTER — Encounter (HOSPITAL_BASED_OUTPATIENT_CLINIC_OR_DEPARTMENT_OTHER): Payer: Self-pay | Admitting: Physical Therapy

## 2023-05-13 ENCOUNTER — Ambulatory Visit (HOSPITAL_BASED_OUTPATIENT_CLINIC_OR_DEPARTMENT_OTHER): Payer: HMO | Admitting: Physical Therapy

## 2023-05-13 DIAGNOSIS — H52203 Unspecified astigmatism, bilateral: Secondary | ICD-10-CM | POA: Diagnosis not present

## 2023-05-13 DIAGNOSIS — R262 Difficulty in walking, not elsewhere classified: Secondary | ICD-10-CM | POA: Diagnosis not present

## 2023-05-13 DIAGNOSIS — M25562 Pain in left knee: Secondary | ICD-10-CM

## 2023-05-13 DIAGNOSIS — M6281 Muscle weakness (generalized): Secondary | ICD-10-CM

## 2023-05-13 DIAGNOSIS — H5203 Hypermetropia, bilateral: Secondary | ICD-10-CM | POA: Diagnosis not present

## 2023-05-13 DIAGNOSIS — H2513 Age-related nuclear cataract, bilateral: Secondary | ICD-10-CM | POA: Diagnosis not present

## 2023-05-13 DIAGNOSIS — H524 Presbyopia: Secondary | ICD-10-CM | POA: Diagnosis not present

## 2023-05-13 NOTE — Therapy (Signed)
OUTPATIENT PHYSICAL THERAPY LOWER EXTREMITY TREATMENT   Patient Name: Ashley Blair MRN: [id number] DOB:Jan 01, 1956, 67 y.o., female Today's Date: 05/13/2023  END OF SESSION:  PT End of Session - 05/13/23 1014     Visit Number 22    Number of Visits 30    Date for PT Re-Evaluation 07/26/23    Authorization Type HTA    PT Start Time 1015    PT Stop Time 1055    PT Time Calculation (min) 40 min    Activity Tolerance Patient tolerated treatment well    Behavior During Therapy WFL for tasks assessed/performed                       Past Medical History:  Diagnosis Date   Anxiety    Arthritis    Elevated hemoglobin A1c 2019   5.7   Family history of DES exposure    Fibrocystic breast    GERD (gastroesophageal reflux disease)    HPV (human papilloma virus) infection    Hypercholesterolemia    OCD (obsessive compulsive disorder)    Pre-diabetes    S/P excision of lipoma    Past Surgical History:  Procedure Laterality Date   ANKLE SURGERY     broken as a child   BREAST BIOPSY Right    COLONOSCOPY     DILATION AND CURETTAGE OF UTERUS     LIPOMA EXCISION     rib cage   TOTAL KNEE ARTHROPLASTY Left 02/02/2023   Procedure: LEFT TOTAL KNEE ARTHROPLASTY;  Surgeon: Tarry Kos, MD;  Location: [facility] OR;  Service: Orthopedics;  Laterality: Left;   TRIGGER FINGER RELEASE Left 03/2013   thumb   Patient Active Problem List   Diagnosis Date Noted   Status post total left knee replacement 02/02/2023   Primary osteoarthritis of left knee 02/01/2023   Family history of breast cancer in mother 12/12/2013   H/O diethylstilbestrol (DES) exposure in utero 12/12/2013    PCP:  Thana Ates, MD       REFERRING PROVIDER:  Tarry Kos, MD     REFERRING DIAG:  [phone] (ICD-10-CM) - Primary osteoarthritis of left knee      THERAPY DIAG:  Difficulty walking  Acute pain of left knee  Muscle weakness (generalized)  Rationale for Evaluation and Treatment:  Rehabilitation  ONSET DATE: 02/02/23 DOS Manipulation 8/29    Days since surgery: 53   Operative procedure: Left total knee arthroplasty.   SUBJECTIVE:   SUBJECTIVE STATEMENT: Pt reports no pain since last session. Pt was able to walk her dogs but has more tib anterior type discomfort.   PERTINENT HISTORY: N/A PAIN:  Are you having pain? No NPRS scale: 2-3/10 Pain location: posterior knee anterior knee  Pain description: "tight band feeling" Aggravating factors: bending Relieving factors: N/A, ice/rest   PRECAUTIONS: Knee  RED FLAGS: None   WEIGHT BEARING RESTRICTIONS: No  FALLS:  Has patient fallen in last 6 months? No  LIVING ENVIRONMENT: Lives with: lives with their family and lives with their spouse Lives in: House/apartment Stairs: Yes Has following equipment at home: Single point cane  OCCUPATION: Marketing executive; Office manager  PLOF: Independent  PATIENT GOALS: return to normal exercise    OBJECTIVE:   DIAGNOSTIC FINDINGS: N/A  PATIENT SURVEYS:  FOTO 39 60 @ DC 12 MCII  FOTO 72% (GOAL MET 9/23)   COGNITION: Overall cognitive status: Within functional limits for tasks assessed     SENSATION: WFL  EDEMA:  Moderate, palpable edema anteriorly and posteriorly into popliteal fossa  LOWER EXTREMITY ROM:  Active ROM Right eval Left eval L 7/20 L 7/24 Lt 8/5 Lt  8/5 8/19 8/30 9/3 04/08/23 04/10/23 04/14/23 9/11 04/17/23 04/18/23 04/27/23 04/28/23 9/30  Hip flexion                    Hip extension                    Hip abduction                    Hip adduction                    Hip internal rotation                    Hip external rotation                    Knee flexion 125 75 78 86 95 91 101 CKC  107 105 90 at beginning of session, improves to 110 90 Improves to  117 92 at beginning of session, improves to 115 90  116 at end of session 101 102  116 at end of session 117 108 at beginning of session 122  Knee extension 3 -12 -5   1 0  0        0 0 0  Ankle dorsiflexion                    Ankle plantarflexion                    Ankle inversion                    Ankle eversion                     (Blank rows = not tested)  MMT (HHD in lbs)  Right 9/23 Left 9/23  Hip flexion    Hip extension    Hip abduction    Hip adduction    Hip internal rotation    Hip external rotation    Knee flexion 38.3, 38.8, 38.2 27.6, 33.5, 31.1  Knee extension 39.5, 43.6, 46.8 39.9, 42.1, 41.1  Ankle dorsiflexion    Ankle plantarflexion    Ankle inversion    Ankle eversion     (Blank rows = not tested)   TODAY'S TREATMENT:   10/7   Recumbent bike warm up 6 min    STM L tib ant and fibularis group  SL leg press 65lbs 4x5 white machine  SL calf raise 30lbs on white leg press 3x10 SLS on airex with 5lb weight pass 2x20 5lb SL RDL 3x10 Tib ant raise with back on wall 2x15    10/7   Recumbent bike warm up 6 min   Cannonball squat 2x10  8" box runners step up 3x8 fwd and lateral Machine calf raise 2x12 (knee straight) leg press 30lbs  SL leg press 15lbs 3x5 gray machine  SLS on airex with 5lb weight pass 3x10  10lb staggered RDL 3x12  Manual eccentric resisted knee ext and flexion 3x10     10/4:   Recumbent bike warm up 6 min  Incline stretch PROM L knee  Eccentric step downs fwd and lateral Step up with march- 4"+airex Retro eccentric step down   Knee ext machine single leg 15lbs 3x5 (  intermittent R LE assist)  Bosu step up 3x10  Standing SLS with star reach 3x5 Prone HS curl 5lbs 3x8   9/30  Starting 115; Ending 122  Recumbent bike warm up 5 min   Grade IV tibial IR/ER bias flexion mob   Knee ext machine single leg 15lbs 3x5 (intermittent R LE assist)  Bosu step up 3x10  Standing gastroc/soleus stretch 30s 2x each  Deep cannonball squat with Ue assist 2x10 Standing SLS with star reach 3x5 Prone HS curl 5lbs 3x8   04/29/23 Manual: STM to L lateral gastroc pre and post dry needling for  trigger point identification and muscular relaxation.  Trigger Point Dry-Needling  Treatment instructions: Expect mild to moderate muscle soreness. S/S of pneumothorax if dry needled over a lung field, and to seek immediate medical attention should they occur. Patient verbalized understanding of these instructions and education.   Patient Consent Given: Yes Education (verbally/handout)provided: Yes Muscles treated: L lateral gastroc and L lateral HS Electrical stimulation performed: none Parameters:   n/a Treatment response/outcome: several large LTR elicited; palpable increase in soft tissue extensibility Standing soleus stretch 3 x 20 second holds Cannonball/feet together squat  3x8 with table support and heel prop Shuttle SL leg press 50lbs 3x10 Split squat with RLE on table and unilateral UE support 2 x 10    9/23  Trigger Point Dry-Needling  Treatment instructions: Expect mild to moderate muscle soreness. S/S of pneumothorax if dry needled over a lung field, and to seek immediate medical attention should they occur. Patient verbalized understanding of these instructions and education.   Patient Consent Given: Yes Education (verbally/handout)provided: Yes Muscles treated: L lateral gastroc and L lateral HS Electrical stimulation performed: none Parameters:   n/a Treatment response/outcome: several large LTR elicited; palpable increase in soft tissue extensibility  STM L gastroc and HS Cannonball/feet together squat  3x8 with table support and heel prop  Recovery, exercise progression, healing timeline, self STM    Starting 109;  9/18 Starting 111; 116 after manual  STM L adductor and medial HS group L knee Grade IV tibial AP glides in progressive flexion with tibial IR and ER  Self STM of adductor and HS with foam roller  Shuttle SL leg press 50lbs 3x6  Cannonball/feet together squat  3x8 with table support and heel prop       PATIENT EDUCATION:  Education  details:  anatomy, exercise progression, DOMS expectations, muscle firing,  envelope of function, HEP, POC 04/10/23: hep scar mobilizations  Person educated: Patient Education method: Explanation, Demonstration, Tactile cues, Verbal cues, and Handouts Education comprehension: verbalized understanding, returned demonstration, verbal cues required, tactile cues required, and needs further education     HOME EXERCISE PROGRAM:  Access Code: [email or web address] URL: [email or web address]   ASSESSMENT:   CLINICAL IMPRESSION: Pt continues to be able to progress strengthening, balance, and motor control of the L LE with good tolerance. Soreness in tib ant appears related to walk and lower shank muscle atrophy from surgery. HEP updated and progressed today for more strengthening and improvement in functional tasks, ADL, and community mobility. Plan to continue with dynamic stability and strength as able. Pt would benefit from continued skilled therapy in order to reach goals and maximize functional L LE strength and ROM for return to PLOF.    OBJECTIVE IMPAIRMENTS decreased ROM, decreased strength, hypomobility, increased fascial restrictions, increased muscle spasms, impaired flexibility, improper body mechanics, postural dysfunction, and pain.    ACTIVITY LIMITATIONS carrying, lifting, sitting, stairs, locomotion level  PARTICIPATION LIMITATIONS: driving, shopping, community activity, occupation, and yard work   PERSONAL FACTORS Age, Time since onset of injury/illness/exacerbation, and 1 comorbidity:    are also affecting patient's functional outcome.        GOALS:   SHORT TERM GOALS: Target date: 04/02/2023     Pt will become independent with HEP in order to demonstrate synthesis of PT education.  Goal status: met   2.  Pt will be able to demonstrate at least 110 deg knee flexion  in order to demonstrate functional improvement in LE function for self-care and house hold  duties.    Goal status: MET   3.  Pt will score at least 12 pt increase on FOTO to demonstrate functional improvement in MCII and pt perceived function.     Goal status: MET       LONG TERM GOALS: Target date: 07/20/2023      Pt  will become independent with final HEP in order to demonstrate synthesis of PT education.    Goal status: ongoing   2.  Pt will be able to demonstrate/report ability to walk >30 mins without pain in order to demonstrate functional improvement and tolerance to exercise and community mobility.    Goal status: met   3.  Pt will be able to demonstrate full depth squat in order to demonstrate functional improvement in LE function for self-care and house hold duties.  ongoing  4.  Pt will score >/= 60 on FOTO to demonstrate improvement in perceived L knee function.  Baseline:  Goal status: MET  5.  Pt will be able to demonstrate full L knee ROM in order to demonstrate functional improvement in LE function for self-care and house hold duties.  ongoing   PLAN: PT FREQUENCY: 1-2x/week 12 wks   PT DURATION: POC date   PLANNED INTERVENTIONS: Therapeutic exercises, Therapeutic activity, Neuromuscular re-education, Balance training, Gait training, Patient/Family education, Self Care, Joint mobilization, Joint manipulation, Orthotic/Fit training, Aquatic Therapy, Dry Needling, Electrical stimulation, Spinal manipulation, Spinal mobilization, Cryotherapy, Moist heat, Compression bandaging, scar mobilization, Splintting, Taping, Vasopneumatic device, Traction, Ultrasound, Ionotophoresis 4mg /ml Dexamethasone, Manual therapy, and Re-evaluation   PLAN FOR NEXT SESSION: PROM into flexion, work back into there-ex as tolerated. Cupping/ DN PRN.    Zebedee Iba, PT 05/13/2023, 11:01 AM

## 2023-05-18 ENCOUNTER — Ambulatory Visit (HOSPITAL_BASED_OUTPATIENT_CLINIC_OR_DEPARTMENT_OTHER): Payer: HMO | Admitting: Physical Therapy

## 2023-05-18 ENCOUNTER — Encounter (HOSPITAL_BASED_OUTPATIENT_CLINIC_OR_DEPARTMENT_OTHER): Payer: Self-pay | Admitting: Physical Therapy

## 2023-05-18 DIAGNOSIS — R262 Difficulty in walking, not elsewhere classified: Secondary | ICD-10-CM | POA: Diagnosis not present

## 2023-05-18 DIAGNOSIS — M6281 Muscle weakness (generalized): Secondary | ICD-10-CM

## 2023-05-18 DIAGNOSIS — M25562 Pain in left knee: Secondary | ICD-10-CM

## 2023-05-18 NOTE — Therapy (Signed)
OUTPATIENT PHYSICAL THERAPY LOWER EXTREMITY TREATMENT   Patient Name: Ashley Blair MRN: [id number] DOB:10-27-1955, 67 y.o., female Today's Date: 05/18/2023  END OF SESSION:  PT End of Session - 05/18/23 0850     Visit Number 23    Number of Visits 30    Date for PT Re-Evaluation 07/26/23    Authorization Type HTA    PT Start Time 0845    PT Stop Time 0927    PT Time Calculation (min) 42 min    Activity Tolerance Patient tolerated treatment well    Behavior During Therapy [facility] for tasks assessed/performed                        Past Medical History:  Diagnosis Date   Anxiety    Arthritis    Elevated hemoglobin A1c 2019   5.7   Family history of DES exposure    Fibrocystic breast    GERD (gastroesophageal reflux disease)    HPV (human papilloma virus) infection    Hypercholesterolemia    OCD (obsessive compulsive disorder)    Pre-diabetes    S/P excision of lipoma    Past Surgical History:  Procedure Laterality Date   ANKLE SURGERY     broken as a child   BREAST BIOPSY Right    COLONOSCOPY     DILATION AND CURETTAGE OF UTERUS     LIPOMA EXCISION     rib cage   TOTAL KNEE ARTHROPLASTY Left 02/02/2023   Procedure: LEFT TOTAL KNEE ARTHROPLASTY;  Surgeon: Tarry Kos, MD;  Location: [facility] OR;  Service: Orthopedics;  Laterality: Left;   TRIGGER FINGER RELEASE Left 03/2013   thumb   Patient Active Problem List   Diagnosis Date Noted   Status post total left knee replacement 02/02/2023   Primary osteoarthritis of left knee 02/01/2023   Family history of breast cancer in mother 12/12/2013   H/O diethylstilbestrol (DES) exposure in utero 12/12/2013    PCP:  Thana Ates, MD       REFERRING PROVIDER:  Tarry Kos, MD     REFERRING DIAG:  [phone] (ICD-10-CM) - Primary osteoarthritis of left knee      THERAPY DIAG:  Difficulty walking  Acute pain of left knee  Muscle weakness (generalized)  Rationale for Evaluation and Treatment:  Rehabilitation  ONSET DATE: 02/02/23 DOS Manipulation 8/29    Days since surgery: 53   Operative procedure: Left total knee arthroplasty.   SUBJECTIVE:   SUBJECTIVE STATEMENT: Pt states that the knee is better. She still feels it going downstairs. She still has constant stiffness and tightness. Sitting for a long time while at a play, hurt the back of the knee.   PERTINENT HISTORY: N/A PAIN:  Are you having pain? No NPRS scale: 2-3/10 Pain location: posterior knee anterior knee  Pain description: "tight band feeling" Aggravating factors: bending Relieving factors: N/A, ice/rest   PRECAUTIONS: Knee  RED FLAGS: None   WEIGHT BEARING RESTRICTIONS: No  FALLS:  Has patient fallen in last 6 months? No  LIVING ENVIRONMENT: Lives with: lives with their family and lives with their spouse Lives in: House/apartment Stairs: Yes Has following equipment at home: Single point cane  OCCUPATION: Marketing executive; Office manager  PLOF: Independent  PATIENT GOALS: return to normal exercise    OBJECTIVE:   DIAGNOSTIC FINDINGS: N/A  PATIENT SURVEYS:  FOTO 39 60 @ DC 12 MCII  FOTO 72% (GOAL MET 9/23)   COGNITION: Overall cognitive status:  Within functional limits for tasks assessed     SENSATION: [facility]  EDEMA:  Moderate, palpable edema anteriorly and posteriorly into popliteal fossa  LOWER EXTREMITY ROM:  Active ROM Right eval Left eval L 7/20 L 7/24 Lt 8/5 Lt  8/5 8/19 8/30 9/3 04/08/23 04/10/23 04/14/23 9/11 04/17/23 04/18/23 04/27/23 04/28/23 9/30  Hip flexion                    Hip extension                    Hip abduction                    Hip adduction                    Hip internal rotation                    Hip external rotation                    Knee flexion 125 75 78 86 95 91 101 CKC  107 105 90 at beginning of session, improves to 110 90 Improves to  117 92 at beginning of session, improves to 115 90  116 at end of session 101 102  116 at end  of session 117 108 at beginning of session 122  Knee extension 3 -12 -5   1 0 0        0 0 0  Ankle dorsiflexion                    Ankle plantarflexion                    Ankle inversion                    Ankle eversion                     (Blank rows = not tested)  MMT (HHD in lbs)  Right 9/23 Left 9/23  Hip flexion    Hip extension    Hip abduction    Hip adduction    Hip internal rotation    Hip external rotation    Knee flexion 38.3, 38.8, 38.2 27.6, 33.5, 31.1  Knee extension 39.5, 43.6, 46.8 39.9, 42.1, 41.1  Ankle dorsiflexion    Ankle plantarflexion    Ankle inversion    Ankle eversion     (Blank rows = not tested)   TODAY'S TREATMENT:   10/14  Recumbent bike warm up 6 min  STM L hamstring Manual LAD with flexion mob in prone  Towel standing hamstring slider 2x5 Kneeling squat 2x6 Standing diver with R UE support (loaded stretch) 2x10 SL leg press 81lbs 5x5 shuttle (gym leg press unavailable)  Lateral hurdle march 6x hurdles 4x laps  Airex step over and back (L SL balance) 2x10 Cannonball squat with heel prop 2x12 10/7   Recumbent bike warm up 6 min    STM L tib ant and fibularis group  SL leg press 65lbs 4x5 white machine  SL calf raise 30lbs on white leg press 3x10 SLS on airex with 5lb weight pass 2x20 5lb SL RDL 3x10 Tib ant raise with back on wall 2x15    10/7   Recumbent bike warm up 6 min   Cannonball squat 2x10  8" box runners step up 3x8 fwd  and lateral Machine calf raise 2x12 (knee straight) leg press 30lbs  SL leg press 15lbs 3x5 gray machine  SLS on airex with 5lb weight pass 3x10  10lb staggered RDL 3x12  Manual eccentric resisted knee ext and flexion 3x10     10/4:   Recumbent bike warm up 6 min  Incline stretch PROM L knee  Eccentric step downs fwd and lateral Step up with march- 4"+airex Retro eccentric step down   Knee ext machine single leg 15lbs 3x5 (intermittent R LE assist)  Bosu step up 3x10  Standing  SLS with star reach 3x5 Prone HS curl 5lbs 3x8   9/30  Starting 115; Ending 122  Recumbent bike warm up 5 min   Grade IV tibial IR/ER bias flexion mob   Knee ext machine single leg 15lbs 3x5 (intermittent R LE assist)  Bosu step up 3x10  Standing gastroc/soleus stretch 30s 2x each  Deep cannonball squat with Ue assist 2x10 Standing SLS with star reach 3x5 Prone HS curl 5lbs 3x8   04/29/23 Manual: STM to L lateral gastroc pre and post dry needling for trigger point identification and muscular relaxation.  Trigger Point Dry-Needling  Treatment instructions: Expect mild to moderate muscle soreness. S/S of pneumothorax if dry needled over a lung field, and to seek immediate medical attention should they occur. Patient verbalized understanding of these instructions and education.   Patient Consent Given: Yes Education (verbally/handout)provided: Yes Muscles treated: L lateral gastroc and L lateral HS Electrical stimulation performed: none Parameters:   n/a Treatment response/outcome: several large LTR elicited; palpable increase in soft tissue extensibility Standing soleus stretch 3 x 20 second holds Cannonball/feet together squat  3x8 with table support and heel prop Shuttle SL leg press 50lbs 3x10 Split squat with RLE on table and unilateral UE support 2 x 10    9/23  Trigger Point Dry-Needling  Treatment instructions: Expect mild to moderate muscle soreness. S/S of pneumothorax if dry needled over a lung field, and to seek immediate medical attention should they occur. Patient verbalized understanding of these instructions and education.   Patient Consent Given: Yes Education (verbally/handout)provided: Yes Muscles treated: L lateral gastroc and L lateral HS Electrical stimulation performed: none Parameters:   n/a Treatment response/outcome: several large LTR elicited; palpable increase in soft tissue extensibility  STM L gastroc and HS Cannonball/feet together squat   3x8 with table support and heel prop  Recovery, exercise progression, healing timeline, self STM    Starting 109;  9/18 Starting 111; 116 after manual  STM L adductor and medial HS group L knee Grade IV tibial AP glides in progressive flexion with tibial IR and ER  Self STM of adductor and HS with foam roller  Shuttle SL leg press 50lbs 3x6  Cannonball/feet together squat  3x8 with table support and heel prop       PATIENT EDUCATION:  Education details:  anatomy, exercise progression, DOMS expectations, muscle firing,  envelope of function, HEP, POC 04/10/23: hep scar mobilizations  Person educated: Patient Education method: Explanation, Demonstration, Tactile cues, Verbal cues, and Handouts Education comprehension: verbalized understanding, returned demonstration, verbal cues required, tactile cues required, and needs further education     HOME EXERCISE PROGRAM:  Access Code: [email or web address] URL: [email or web address]   ASSESSMENT:   CLINICAL IMPRESSION: Pt able to continue with SL strength and more dynamic SL stability to simulate community activity. Pt's posterior knee pain still consistent with swelling related pain. Pt still able to reach 122 flexion but occurs  in CKC with discomfort at end range. Pt advised on icing with elevation and compression to aid in discomfort. Plan to update HEP at next and continue with quad, HS, and hip strengthening/motor control. Pt would benefit from continued skilled therapy in order to reach goals and maximize functional L LE strength and ROM for return to PLOF.    OBJECTIVE IMPAIRMENTS decreased ROM, decreased strength, hypomobility, increased fascial restrictions, increased muscle spasms, impaired flexibility, improper body mechanics, postural dysfunction, and pain.    ACTIVITY LIMITATIONS carrying, lifting, sitting, stairs, locomotion level   PARTICIPATION LIMITATIONS: driving, shopping, community activity,  occupation, and yard work   PERSONAL FACTORS Age, Time since onset of injury/illness/exacerbation, and 1 comorbidity:    are also affecting patient's functional outcome.        GOALS:   SHORT TERM GOALS: Target date: 04/02/2023     Pt will become independent with HEP in order to demonstrate synthesis of PT education.  Goal status: met   2.  Pt will be able to demonstrate at least 110 deg knee flexion  in order to demonstrate functional improvement in LE function for self-care and house hold duties.    Goal status: MET   3.  Pt will score at least 12 pt increase on FOTO to demonstrate functional improvement in MCII and pt perceived function.     Goal status: MET       LONG TERM GOALS: Target date: 07/20/2023      Pt  will become independent with final HEP in order to demonstrate synthesis of PT education.    Goal status: ongoing   2.  Pt will be able to demonstrate/report ability to walk >30 mins without pain in order to demonstrate functional improvement and tolerance to exercise and community mobility.    Goal status: met   3.  Pt will be able to demonstrate full depth squat in order to demonstrate functional improvement in LE function for self-care and house hold duties.  ongoing  4.  Pt will score >/= 60 on FOTO to demonstrate improvement in perceived L knee function.  Baseline:  Goal status: MET  5.  Pt will be able to demonstrate full L knee ROM in order to demonstrate functional improvement in LE function for self-care and house hold duties.  ongoing   PLAN: PT FREQUENCY: 1-2x/week 12 wks   PT DURATION: POC date   PLANNED INTERVENTIONS: Therapeutic exercises, Therapeutic activity, Neuromuscular re-education, Balance training, Gait training, Patient/Family education, Self Care, Joint mobilization, Joint manipulation, Orthotic/Fit training, Aquatic Therapy, Dry Needling, Electrical stimulation, Spinal manipulation, Spinal mobilization, Cryotherapy, Moist  heat, Compression bandaging, scar mobilization, Splintting, Taping, Vasopneumatic device, Traction, Ultrasound, Ionotophoresis 4mg /ml Dexamethasone, Manual therapy, and Re-evaluation   PLAN FOR NEXT SESSION: PROM into flexion, work back into there-ex as tolerated. Cupping/ DN PRN.    Zebedee Iba, PT 05/18/2023, 9:30 AM

## 2023-05-20 ENCOUNTER — Ambulatory Visit (HOSPITAL_BASED_OUTPATIENT_CLINIC_OR_DEPARTMENT_OTHER): Payer: HMO | Admitting: Physical Therapy

## 2023-05-20 ENCOUNTER — Encounter (HOSPITAL_BASED_OUTPATIENT_CLINIC_OR_DEPARTMENT_OTHER): Payer: Self-pay | Admitting: Physical Therapy

## 2023-05-20 DIAGNOSIS — R262 Difficulty in walking, not elsewhere classified: Secondary | ICD-10-CM

## 2023-05-20 DIAGNOSIS — M25562 Pain in left knee: Secondary | ICD-10-CM

## 2023-05-20 DIAGNOSIS — M6281 Muscle weakness (generalized): Secondary | ICD-10-CM

## 2023-05-20 NOTE — Therapy (Signed)
OUTPATIENT PHYSICAL THERAPY LOWER EXTREMITY TREATMENT   Patient Name: Ashley Blair MRN: [id number] DOB:02/05/1956, 67 y.o., female Today's Date: 05/20/2023  END OF SESSION:  PT End of Session - 05/20/23 0934     Visit Number 24    Number of Visits 30    Date for PT Re-Evaluation 07/26/23    Authorization Type HTA    PT Start Time 0930    PT Stop Time 1009    PT Time Calculation (min) 39 min    Activity Tolerance Patient tolerated treatment well    Behavior During Therapy WFL for tasks assessed/performed                        Past Medical History:  Diagnosis Date   Anxiety    Arthritis    Elevated hemoglobin A1c 2019   5.7   Family history of DES exposure    Fibrocystic breast    GERD (gastroesophageal reflux disease)    HPV (human papilloma virus) infection    Hypercholesterolemia    OCD (obsessive compulsive disorder)    Pre-diabetes    S/P excision of lipoma    Past Surgical History:  Procedure Laterality Date   ANKLE SURGERY     broken as a child   BREAST BIOPSY Right    COLONOSCOPY     DILATION AND CURETTAGE OF UTERUS     LIPOMA EXCISION     rib cage   TOTAL KNEE ARTHROPLASTY Left 02/02/2023   Procedure: LEFT TOTAL KNEE ARTHROPLASTY;  Surgeon: Tarry Kos, MD;  Location: [facility] OR;  Service: Orthopedics;  Laterality: Left;   TRIGGER FINGER RELEASE Left 03/2013   thumb   Patient Active Problem List   Diagnosis Date Noted   Status post total left knee replacement 02/02/2023   Primary osteoarthritis of left knee 02/01/2023   Family history of breast cancer in mother 12/12/2013   H/O diethylstilbestrol (DES) exposure in utero 12/12/2013    PCP:  Thana Ates, MD       REFERRING PROVIDER:  Tarry Kos, MD     REFERRING DIAG:  [phone] (ICD-10-CM) - Primary osteoarthritis of left knee      THERAPY DIAG:  Difficulty walking  Acute pain of left knee  Muscle weakness (generalized)  Rationale for Evaluation and Treatment:  Rehabilitation  ONSET DATE: 02/02/23 DOS Manipulation 8/29    Days since surgery: 53   Operative procedure: Left total knee arthroplasty.   SUBJECTIVE:   SUBJECTIVE STATEMENT: Pt states the knee is good today. It is still tight across the front in a band pattern.   PERTINENT HISTORY: N/A PAIN:  Are you having pain? No NPRS scale: 2-3/10 Pain location: posterior knee anterior knee  Pain description: "tight band feeling" Aggravating factors: bending Relieving factors: N/A, ice/rest   PRECAUTIONS: Knee  RED FLAGS: None   WEIGHT BEARING RESTRICTIONS: No  FALLS:  Has patient fallen in last 6 months? No  LIVING ENVIRONMENT: Lives with: lives with their family and lives with their spouse Lives in: House/apartment Stairs: Yes Has following equipment at home: Single point cane  OCCUPATION: Marketing executive; Office manager  PLOF: Independent  PATIENT GOALS: return to normal exercise    OBJECTIVE:   DIAGNOSTIC FINDINGS: N/A  PATIENT SURVEYS:  FOTO 39 60 @ DC 12 MCII  FOTO 72% (GOAL MET 9/23)   COGNITION: Overall cognitive status: Within functional limits for tasks assessed     SENSATION: WFL  EDEMA:  Moderate, palpable  edema anteriorly and posteriorly into popliteal fossa  LOWER EXTREMITY ROM:  Active ROM Right eval Left eval L 7/20 L 7/24 Lt 8/5 Lt  8/5 8/19 8/30 9/3 04/08/23 04/10/23 04/14/23 9/11 04/17/23 04/18/23 04/27/23 04/28/23 9/30  Hip flexion                    Hip extension                    Hip abduction                    Hip adduction                    Hip internal rotation                    Hip external rotation                    Knee flexion 125 75 78 86 95 91 101 CKC  107 105 90 at beginning of session, improves to 110 90 Improves to  117 92 at beginning of session, improves to 115 90  116 at end of session 101 102  116 at end of session 117 108 at beginning of session 122  Knee extension 3 -12 -5   1 0 0        0 0 0  Ankle  dorsiflexion                    Ankle plantarflexion                    Ankle inversion                    Ankle eversion                     (Blank rows = not tested)  MMT (HHD in lbs)  Right 9/23 Left 9/23  Hip flexion    Hip extension    Hip abduction    Hip adduction    Hip internal rotation    Hip external rotation    Knee flexion 38.3, 38.8, 38.2 27.6, 33.5, 31.1  Knee extension 39.5, 43.6, 46.8 39.9, 42.1, 41.1  Ankle dorsiflexion    Ankle plantarflexion    Ankle inversion    Ankle eversion     (Blank rows = not tested)   TODAY'S TREATMENT:   10/16  Start: 113 end: 121 flexion   Grade IV L knee flexion mob with IR/ER bias  SL bridge 3x8   Standing diver with 5lbs (loaded stretch) 3x8  Wobble board AP and ML taps 2x20  Soleus calf raise in half lunge off 4" box 3x10 Standing hip ABD YTB at ankles 3x10 each Bridge on red ball with HS curl 2x10    10/14  Recumbent bike warm up 6 min  STM L hamstring Manual LAD with flexion mob in prone  Towel standing hamstring slider 2x5 Kneeling squat 2x6 Standing diver with R UE support (loaded stretch) 2x10 SL leg press 81lbs 5x5 shuttle (gym leg press unavailable)  Lateral hurdle march 6x hurdles 4x laps  Airex step over and back (L SL balance) 2x10 Cannonball squat with heel prop 2x12 10/7   Recumbent bike warm up 6 min    STM L tib ant and fibularis group  SL leg press 65lbs 4x5 white machine  SL calf  raise 30lbs on white leg press 3x10 SLS on airex with 5lb weight pass 2x20 5lb SL RDL 3x10 Tib ant raise with back on wall 2x15    10/7   Recumbent bike warm up 6 min   Cannonball squat 2x10  8" box runners step up 3x8 fwd and lateral Machine calf raise 2x12 (knee straight) leg press 30lbs  SL leg press 15lbs 3x5 gray machine  SLS on airex with 5lb weight pass 3x10  10lb staggered RDL 3x12  Manual eccentric resisted knee ext and flexion 3x10     10/4:   Recumbent bike warm up 6 min  Incline  stretch PROM L knee  Eccentric step downs fwd and lateral Step up with march- 4"+airex Retro eccentric step down   Knee ext machine single leg 15lbs 3x5 (intermittent R LE assist)  Bosu step up 3x10  Standing SLS with star reach 3x5 Prone HS curl 5lbs 3x8   9/30  Starting 115; Ending 122  Recumbent bike warm up 5 min   Grade IV tibial IR/ER bias flexion mob   Knee ext machine single leg 15lbs 3x5 (intermittent R LE assist)  Bosu step up 3x10  Standing gastroc/soleus stretch 30s 2x each  Deep cannonball squat with Ue assist 2x10 Standing SLS with star reach 3x5 Prone HS curl 5lbs 3x8   04/29/23 Manual: STM to L lateral gastroc pre and post dry needling for trigger point identification and muscular relaxation.  Trigger Point Dry-Needling  Treatment instructions: Expect mild to moderate muscle soreness. S/S of pneumothorax if dry needled over a lung field, and to seek immediate medical attention should they occur. Patient verbalized understanding of these instructions and education.   Patient Consent Given: Yes Education (verbally/handout)provided: Yes Muscles treated: L lateral gastroc and L lateral HS Electrical stimulation performed: none Parameters:   n/a Treatment response/outcome: several large LTR elicited; palpable increase in soft tissue extensibility Standing soleus stretch 3 x 20 second holds Cannonball/feet together squat  3x8 with table support and heel prop Shuttle SL leg press 50lbs 3x10 Split squat with RLE on table and unilateral UE support 2 x 10    9/23  Trigger Point Dry-Needling  Treatment instructions: Expect mild to moderate muscle soreness. S/S of pneumothorax if dry needled over a lung field, and to seek immediate medical attention should they occur. Patient verbalized understanding of these instructions and education.   Patient Consent Given: Yes Education (verbally/handout)provided: Yes Muscles treated: L lateral gastroc and L lateral  HS Electrical stimulation performed: none Parameters:   n/a Treatment response/outcome: several large LTR elicited; palpable increase in soft tissue extensibility  STM L gastroc and HS Cannonball/feet together squat  3x8 with table support and heel prop  Recovery, exercise progression, healing timeline, self STM    Starting 109;  9/18 Starting 111; 116 after manual  STM L adductor and medial HS group L knee Grade IV tibial AP glides in progressive flexion with tibial IR and ER  Self STM of adductor and HS with foam roller  Shuttle SL leg press 50lbs 3x6  Cannonball/feet together squat  3x8 with table support and heel prop       PATIENT EDUCATION:  Education details:  anatomy, exercise progression, DOMS expectations, muscle firing,  envelope of function, HEP, POC 04/10/23: hep scar mobilizations  Person educated: Patient Education method: Explanation, Demonstration, Tactile cues, Verbal cues, and Handouts Education comprehension: verbalized understanding, returned demonstration, verbal cues required, tactile cues required, and needs further education  HOME EXERCISE PROGRAM:  Access Code: [email or web address] URL: [email or web address]   ASSESSMENT:   CLINICAL IMPRESSION: Pt's L knee flexion stiffness improves with joint mob and loaded exercise. Pt able to get to 0 deg ext and 121 flexion. HEP updated today with progression quad, soleus, HS, and hip strengthening in standing positions in order to challenge stability. Pt still with decreased motor control on L with movement as expected. Consider more SL based activity at next. Pt would benefit from continued skilled therapy in order to reach goals and maximize functional L LE strength and ROM for return to PLOF.    OBJECTIVE IMPAIRMENTS decreased ROM, decreased strength, hypomobility, increased fascial restrictions, increased muscle spasms, impaired flexibility, improper body mechanics, postural dysfunction,  and pain.    ACTIVITY LIMITATIONS carrying, lifting, sitting, stairs, locomotion level   PARTICIPATION LIMITATIONS: driving, shopping, community activity, occupation, and yard work   PERSONAL FACTORS Age, Time since onset of injury/illness/exacerbation, and 1 comorbidity:    are also affecting patient's functional outcome.        GOALS:   SHORT TERM GOALS: Target date: 04/02/2023     Pt will become independent with HEP in order to demonstrate synthesis of PT education.  Goal status: met   2.  Pt will be able to demonstrate at least 110 deg knee flexion  in order to demonstrate functional improvement in LE function for self-care and house hold duties.    Goal status: MET   3.  Pt will score at least 12 pt increase on FOTO to demonstrate functional improvement in MCII and pt perceived function.     Goal status: MET       LONG TERM GOALS: Target date: 07/20/2023      Pt  will become independent with final HEP in order to demonstrate synthesis of PT education.    Goal status: ongoing   2.  Pt will be able to demonstrate/report ability to walk >30 mins without pain in order to demonstrate functional improvement and tolerance to exercise and community mobility.    Goal status: met   3.  Pt will be able to demonstrate full depth squat in order to demonstrate functional improvement in LE function for self-care and house hold duties.  ongoing  4.  Pt will score >/= 60 on FOTO to demonstrate improvement in perceived L knee function.  Baseline:  Goal status: MET  5.  Pt will be able to demonstrate full L knee ROM in order to demonstrate functional improvement in LE function for self-care and house hold duties.  ongoing   PLAN: PT FREQUENCY: 1-2x/week 12 wks   PT DURATION: POC date   PLANNED INTERVENTIONS: Therapeutic exercises, Therapeutic activity, Neuromuscular re-education, Balance training, Gait training, Patient/Family education, Self Care, Joint mobilization,  Joint manipulation, Orthotic/Fit training, Aquatic Therapy, Dry Needling, Electrical stimulation, Spinal manipulation, Spinal mobilization, Cryotherapy, Moist heat, Compression bandaging, scar mobilization, Splintting, Taping, Vasopneumatic device, Traction, Ultrasound, Ionotophoresis 4mg /ml Dexamethasone, Manual therapy, and Re-evaluation   PLAN FOR NEXT SESSION: PROM into flexion, work back into there-ex as tolerated. Cupping/ DN PRN.    Zebedee Iba, PT 05/20/2023, 10:13 AM

## 2023-05-25 ENCOUNTER — Encounter (HOSPITAL_BASED_OUTPATIENT_CLINIC_OR_DEPARTMENT_OTHER): Payer: Self-pay

## 2023-05-25 ENCOUNTER — Ambulatory Visit (HOSPITAL_BASED_OUTPATIENT_CLINIC_OR_DEPARTMENT_OTHER): Payer: HMO

## 2023-05-25 ENCOUNTER — Ambulatory Visit (HOSPITAL_BASED_OUTPATIENT_CLINIC_OR_DEPARTMENT_OTHER): Payer: HMO | Admitting: Physical Therapy

## 2023-05-27 ENCOUNTER — Ambulatory Visit (HOSPITAL_BASED_OUTPATIENT_CLINIC_OR_DEPARTMENT_OTHER): Payer: HMO | Admitting: Physical Therapy

## 2023-05-27 ENCOUNTER — Encounter (HOSPITAL_BASED_OUTPATIENT_CLINIC_OR_DEPARTMENT_OTHER): Payer: Self-pay | Admitting: Physical Therapy

## 2023-05-27 DIAGNOSIS — M25562 Pain in left knee: Secondary | ICD-10-CM

## 2023-05-27 DIAGNOSIS — M6281 Muscle weakness (generalized): Secondary | ICD-10-CM

## 2023-05-27 DIAGNOSIS — R262 Difficulty in walking, not elsewhere classified: Secondary | ICD-10-CM

## 2023-05-27 NOTE — Therapy (Signed)
OUTPATIENT PHYSICAL THERAPY LOWER EXTREMITY TREATMENT   Patient Name: Ashley Blair MRN: [id number] DOB:1955/12/11, 67 y.o., female Today's Date: 05/27/2023  END OF SESSION:  PT End of Session - 05/27/23 1025     Visit Number 25    Number of Visits 30    Date for PT Re-Evaluation 07/26/23    Authorization Type HTA    PT Start Time 1016    PT Stop Time 1048    PT Time Calculation (min) 32 min    Activity Tolerance Patient tolerated treatment well    Behavior During Therapy WFL for tasks assessed/performed                         Past Medical History:  Diagnosis Date   Anxiety    Arthritis    Elevated hemoglobin A1c 2019   5.7   Family history of DES exposure    Fibrocystic breast    GERD (gastroesophageal reflux disease)    HPV (human papilloma virus) infection    Hypercholesterolemia    OCD (obsessive compulsive disorder)    Pre-diabetes    S/P excision of lipoma    Past Surgical History:  Procedure Laterality Date   ANKLE SURGERY     broken as a child   BREAST BIOPSY Right    COLONOSCOPY     DILATION AND CURETTAGE OF UTERUS     LIPOMA EXCISION     rib cage   TOTAL KNEE ARTHROPLASTY Left 02/02/2023   Procedure: LEFT TOTAL KNEE ARTHROPLASTY;  Surgeon: Tarry Kos, MD;  Location: [facility] OR;  Service: Orthopedics;  Laterality: Left;   TRIGGER FINGER RELEASE Left 03/2013   thumb   Patient Active Problem List   Diagnosis Date Noted   Status post total left knee replacement 02/02/2023   Primary osteoarthritis of left knee 02/01/2023   Family history of breast cancer in mother 12/12/2013   H/O diethylstilbestrol (DES) exposure in utero 12/12/2013    PCP:  Thana Ates, MD       REFERRING PROVIDER:  Tarry Kos, MD     REFERRING DIAG:  [phone] (ICD-10-CM) - Primary osteoarthritis of left knee      THERAPY DIAG:  Difficulty walking  Acute pain of left knee  Muscle weakness (generalized)  Rationale for Evaluation and Treatment:  Rehabilitation  ONSET DATE: 02/02/23 DOS Manipulation 8/29    Days since surgery: 53   Operative procedure: Left total knee arthroplasty.   SUBJECTIVE:   SUBJECTIVE STATEMENT: Pt states that the knee is continuing to improve. She still feels it in the back of the knee with activity but does not increase with excessive stiffness.   PERTINENT HISTORY: N/A PAIN:  Are you having pain? No NPRS scale: 2-3/10 Pain location: posterior knee anterior knee  Pain description: "tight band feeling" Aggravating factors: bending Relieving factors: N/A, ice/rest   PRECAUTIONS: Knee  RED FLAGS: None   WEIGHT BEARING RESTRICTIONS: No  FALLS:  Has patient fallen in last 6 months? No  LIVING ENVIRONMENT: Lives with: lives with their family and lives with their spouse Lives in: House/apartment Stairs: Yes Has following equipment at home: Single point cane  OCCUPATION: Marketing executive; Office manager  PLOF: Independent  PATIENT GOALS: return to normal exercise    OBJECTIVE:   DIAGNOSTIC FINDINGS: N/A  PATIENT SURVEYS:  FOTO 39 60 @ DC 12 MCII  FOTO 72% (GOAL MET 9/23)   COGNITION: Overall cognitive status: Within functional limits for tasks assessed  SENSATION: WFL  EDEMA:  Moderate, palpable edema anteriorly and posteriorly into popliteal fossa  LOWER EXTREMITY ROM:  Active ROM Right eval Left eval L 7/20 L 7/24 Lt 8/5 Lt  8/5 8/19 8/30 9/3 04/08/23 04/10/23 04/14/23 9/11 04/17/23 04/18/23 04/27/23 04/28/23 9/30  Hip flexion                    Hip extension                    Hip abduction                    Hip adduction                    Hip internal rotation                    Hip external rotation                    Knee flexion 125 75 78 86 95 91 101 CKC  107 105 90 at beginning of session, improves to 110 90 Improves to  117 92 at beginning of session, improves to 115 90  116 at end of session 101 102  116 at end of session 117 108 at beginning of  session 122  Knee extension 3 -12 -5   1 0 0        0 0 0  Ankle dorsiflexion                    Ankle plantarflexion                    Ankle inversion                    Ankle eversion                     (Blank rows = not tested)  MMT (HHD in lbs)  Right 9/23 Left 9/23  Hip flexion    Hip extension    Hip abduction    Hip adduction    Hip internal rotation    Hip external rotation    Knee flexion 38.3, 38.8, 38.2 27.6, 33.5, 31.1  Knee extension 39.5, 43.6, 46.8 39.9, 42.1, 41.1  Ankle dorsiflexion    Ankle plantarflexion    Ankle inversion    Ankle eversion     (Blank rows = not tested)   TODAY'S TREATMENT:   10/23  Recumbent bike warm up 6 min   Wall lunge holds 3x8 3s  Reverse nordic 3x5 Lateral lunge 3x8  HEP progression, kneeling anatomy/mechanics, healing timeline  10/16  Start: 113 end: 121 flexion   Grade IV L knee flexion mob with IR/ER bias  SL bridge 3x8   Standing diver with 5lbs (loaded stretch) 3x8  Wobble board AP and ML taps 2x20  Soleus calf raise in half lunge off 4" box 3x10 Standing hip ABD YTB at ankles 3x10 each Bridge on red ball with HS curl 2x10    10/14  Recumbent bike warm up 6 min  STM L hamstring Manual LAD with flexion mob in prone  Towel standing hamstring slider 2x5 Kneeling squat 2x6 Standing diver with R UE support (loaded stretch) 2x10 SL leg press 81lbs 5x5 shuttle (gym leg press unavailable)  Lateral hurdle march 6x hurdles 4x laps  Airex step over and back (L SL balance)  2x10 Cannonball squat with heel prop 2x12 10/7   Recumbent bike warm up 6 min    STM L tib ant and fibularis group  SL leg press 65lbs 4x5 white machine  SL calf raise 30lbs on white leg press 3x10 SLS on airex with 5lb weight pass 2x20 5lb SL RDL 3x10 Tib ant raise with back on wall 2x15    10/7   Recumbent bike warm up 6 min   Cannonball squat 2x10  8" box runners step up 3x8 fwd and lateral Machine calf raise 2x12 (knee  straight) leg press 30lbs  SL leg press 15lbs 3x5 gray machine  SLS on airex with 5lb weight pass 3x10  10lb staggered RDL 3x12  Manual eccentric resisted knee ext and flexion 3x10     10/4:   Recumbent bike warm up 6 min  Incline stretch PROM L knee  Eccentric step downs fwd and lateral Step up with march- 4"+airex Retro eccentric step down   Knee ext machine single leg 15lbs 3x5 (intermittent R LE assist)  Bosu step up 3x10  Standing SLS with star reach 3x5 Prone HS curl 5lbs 3x8   9/30  Starting 115; Ending 122  Recumbent bike warm up 5 min   Grade IV tibial IR/ER bias flexion mob   Knee ext machine single leg 15lbs 3x5 (intermittent R LE assist)  Bosu step up 3x10  Standing gastroc/soleus stretch 30s 2x each  Deep cannonball squat with Ue assist 2x10 Standing SLS with star reach 3x5 Prone HS curl 5lbs 3x8   04/29/23 Manual: STM to L lateral gastroc pre and post dry needling for trigger point identification and muscular relaxation.  Trigger Point Dry-Needling  Treatment instructions: Expect mild to moderate muscle soreness. S/S of pneumothorax if dry needled over a lung field, and to seek immediate medical attention should they occur. Patient verbalized understanding of these instructions and education.   Patient Consent Given: Yes Education (verbally/handout)provided: Yes Muscles treated: L lateral gastroc and L lateral HS Electrical stimulation performed: none Parameters:   n/a Treatment response/outcome: several large LTR elicited; palpable increase in soft tissue extensibility Standing soleus stretch 3 x 20 second holds Cannonball/feet together squat  3x8 with table support and heel prop Shuttle SL leg press 50lbs 3x10 Split squat with RLE on table and unilateral UE support 2 x 10    9/23  Trigger Point Dry-Needling  Treatment instructions: Expect mild to moderate muscle soreness. S/S of pneumothorax if dry needled over a lung field, and to seek  immediate medical attention should they occur. Patient verbalized understanding of these instructions and education.   Patient Consent Given: Yes Education (verbally/handout)provided: Yes Muscles treated: L lateral gastroc and L lateral HS Electrical stimulation performed: none Parameters:   n/a Treatment response/outcome: several large LTR elicited; palpable increase in soft tissue extensibility  STM L gastroc and HS Cannonball/feet together squat  3x8 with table support and heel prop  Recovery, exercise progression, healing timeline, self STM    Starting 109;  9/18 Starting 111; 116 after manual  STM L adductor and medial HS group L knee Grade IV tibial AP glides in progressive flexion with tibial IR and ER  Self STM of adductor and HS with foam roller  Shuttle SL leg press 50lbs 3x6  Cannonball/feet together squat  3x8 with table support and heel prop       PATIENT EDUCATION:  Education details:  anatomy, exercise progression, DOMS expectations, muscle firing,  envelope of function, HEP, POC 04/10/23:  hep scar mobilizations  Person educated: Patient Education method: Explanation, Demonstration, Tactile cues, Verbal cues, and Handouts Education comprehension: verbalized understanding, returned demonstration, verbal cues required, tactile cues required, and needs further education     HOME EXERCISE PROGRAM:  Access Code: [email or web address] URL: [email or web address]   ASSESSMENT:   CLINICAL IMPRESSION: Pt HEP progressed to more SL activity without pain though posterior knee discomfort persists at end range likely due to residual swelling. Plan to take strength measures at next and reduce pt frequency as she has improved greatly and near her baseline with the exception of step up/down movements. Continue with SL strength actiivty at next for HEP. Pt would benefit from continued skilled therapy in order to reach goals and maximize functional L LE strength and  ROM for return to PLOF.    OBJECTIVE IMPAIRMENTS decreased ROM, decreased strength, hypomobility, increased fascial restrictions, increased muscle spasms, impaired flexibility, improper body mechanics, postural dysfunction, and pain.    ACTIVITY LIMITATIONS carrying, lifting, sitting, stairs, locomotion level   PARTICIPATION LIMITATIONS: driving, shopping, community activity, occupation, and yard work   PERSONAL FACTORS Age, Time since onset of injury/illness/exacerbation, and 1 comorbidity:    are also affecting patient's functional outcome.        GOALS:   SHORT TERM GOALS: Target date: 04/02/2023     Pt will become independent with HEP in order to demonstrate synthesis of PT education.  Goal status: met   2.  Pt will be able to demonstrate at least 110 deg knee flexion  in order to demonstrate functional improvement in LE function for self-care and house hold duties.    Goal status: MET   3.  Pt will score at least 12 pt increase on FOTO to demonstrate functional improvement in MCII and pt perceived function.     Goal status: MET       LONG TERM GOALS: Target date: 07/20/2023      Pt  will become independent with final HEP in order to demonstrate synthesis of PT education.    Goal status: ongoing   2.  Pt will be able to demonstrate/report ability to walk >30 mins without pain in order to demonstrate functional improvement and tolerance to exercise and community mobility.    Goal status: met   3.  Pt will be able to demonstrate full depth squat in order to demonstrate functional improvement in LE function for self-care and house hold duties.  ongoing  4.  Pt will score >/= 60 on FOTO to demonstrate improvement in perceived L knee function.  Baseline:  Goal status: MET  5.  Pt will be able to demonstrate full L knee ROM in order to demonstrate functional improvement in LE function for self-care and house hold duties.  ongoing   PLAN: PT FREQUENCY:  1-2x/week 12 wks   PT DURATION: POC date   PLANNED INTERVENTIONS: Therapeutic exercises, Therapeutic activity, Neuromuscular re-education, Balance training, Gait training, Patient/Family education, Self Care, Joint mobilization, Joint manipulation, Orthotic/Fit training, Aquatic Therapy, Dry Needling, Electrical stimulation, Spinal manipulation, Spinal mobilization, Cryotherapy, Moist heat, Compression bandaging, scar mobilization, Splintting, Taping, Vasopneumatic device, Traction, Ultrasound, Ionotophoresis 4mg /ml Dexamethasone, Manual therapy, and Re-evaluation   PLAN FOR NEXT SESSION: PROM into flexion, work back into there-ex as tolerated. Cupping/ DN PRN.    Zebedee Iba, PT 05/27/2023, 10:52 AM

## 2023-06-01 ENCOUNTER — Encounter (HOSPITAL_BASED_OUTPATIENT_CLINIC_OR_DEPARTMENT_OTHER): Payer: HMO | Admitting: Physical Therapy

## 2023-06-03 ENCOUNTER — Encounter (HOSPITAL_BASED_OUTPATIENT_CLINIC_OR_DEPARTMENT_OTHER): Payer: Self-pay | Admitting: Physical Therapy

## 2023-06-03 ENCOUNTER — Ambulatory Visit (HOSPITAL_BASED_OUTPATIENT_CLINIC_OR_DEPARTMENT_OTHER): Payer: HMO | Admitting: Physical Therapy

## 2023-06-03 DIAGNOSIS — M6281 Muscle weakness (generalized): Secondary | ICD-10-CM

## 2023-06-03 DIAGNOSIS — R262 Difficulty in walking, not elsewhere classified: Secondary | ICD-10-CM

## 2023-06-03 DIAGNOSIS — M25562 Pain in left knee: Secondary | ICD-10-CM

## 2023-06-03 NOTE — Therapy (Signed)
OUTPATIENT PHYSICAL THERAPY LOWER EXTREMITY TREATMENT   Patient Name: Ashley Blair MRN: [id number] DOB:May 04, 1956, 67 y.o., female Today's Date: 06/03/2023  END OF SESSION:  PT End of Session - 06/03/23 1019     Visit Number 26    Number of Visits 30    Date for PT Re-Evaluation 07/26/23    Authorization Type HTA    PT Start Time 1016    PT Stop Time 1048    PT Time Calculation (min) 32 min    Activity Tolerance Patient tolerated treatment well    Behavior During Therapy WFL for tasks assessed/performed                          Past Medical History:  Diagnosis Date   Anxiety    Arthritis    Elevated hemoglobin A1c 2019   5.7   Family history of DES exposure    Fibrocystic breast    GERD (gastroesophageal reflux disease)    HPV (human papilloma virus) infection    Hypercholesterolemia    OCD (obsessive compulsive disorder)    Pre-diabetes    S/P excision of lipoma    Past Surgical History:  Procedure Laterality Date   ANKLE SURGERY     broken as a child   BREAST BIOPSY Right    COLONOSCOPY     DILATION AND CURETTAGE OF UTERUS     LIPOMA EXCISION     rib cage   TOTAL KNEE ARTHROPLASTY Left 02/02/2023   Procedure: LEFT TOTAL KNEE ARTHROPLASTY;  Surgeon: Tarry Kos, MD;  Location: [facility] OR;  Service: Orthopedics;  Laterality: Left;   TRIGGER FINGER RELEASE Left 03/2013   thumb   Patient Active Problem List   Diagnosis Date Noted   Status post total left knee replacement 02/02/2023   Primary osteoarthritis of left knee 02/01/2023   Family history of breast cancer in mother 12/12/2013   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   SUBJECTIVE:   SUBJECTIVE STATEMENT: Pt feels anterior knee tension on the L but without pain. Pt did a strength class without issues.   PERTINENT HISTORY: N/A PAIN:  Are you having pain? No NPRS scale: 2-3/10 Pain location: posterior knee anterior knee  Pain description: "tight band feeling" Aggravating factors: bending Relieving factors: N/A, ice/rest   PRECAUTIONS: Knee  RED FLAGS: None   WEIGHT BEARING RESTRICTIONS: No  FALLS:  Has patient fallen in last 6 months? No  LIVING ENVIRONMENT: Lives with: lives with their family and lives with their spouse Lives in: House/apartment Stairs: Yes Has following equipment at home: Single point cane  OCCUPATION: Marketing executive; Office manager  PLOF: Independent  PATIENT GOALS: return to normal exercise    OBJECTIVE:   DIAGNOSTIC FINDINGS: N/A  PATIENT SURVEYS:  FOTO 39 60 @ DC 12 MCII  FOTO 72% (GOAL MET 9/23)   COGNITION: Overall cognitive status: Within functional limits for tasks assessed     SENSATION: WFL  EDEMA:  Moderate, palpable edema anteriorly and posteriorly into popliteal fossa  LOWER EXTREMITY ROM:  Active ROM Right eval Left eval L 7/20 L 7/24 Lt 8/5 Lt  8/5 8/19 8/30 9/3 04/08/23 04/10/23 04/14/23 9/11 04/17/23 04/18/23 04/27/23 04/28/23 9/30  Hip flexion                    Hip extension                    Hip abduction                    Hip adduction                    Hip internal rotation                    Hip external rotation                    Knee flexion 125 75 78 86 95 91 101 CKC  107 105 90 at beginning of session, improves to 110 90 Improves to  117 92 at beginning of session, improves to 115 90  116 at end of session 101 102  116 at end of session 117 108 at beginning of session 122  Knee extension 3 -12 -5   1 0 0         0 0 0  Ankle dorsiflexion                    Ankle plantarflexion                    Ankle inversion                    Ankle eversion                     (Blank rows = not tested)  MMT (HHD in lbs)  Right 9/23 Left 9/23  Hip flexion    Hip extension    Hip abduction    Hip adduction    Hip internal rotation    Hip external rotation    Knee flexion 38.3, 38.8, 38.2 27.6, 33.5, 31.1  Knee extension 39.5, 43.6, 46.8 39.9, 42.1, 41.1  Ankle dorsiflexion    Ankle plantarflexion    Ankle inversion    Ankle eversion     (Blank rows = not tested)   TODAY'S TREATMENT:   10/30 Recumbent bike warm up 6 min   RTB SL paloff 2x10 Fwd step down 8" 2x10 10lb SL RDL 3x8  10/23  Recumbent bike warm up 6 min   Wall lunge holds 3x8 3s  Reverse nordic 3x5 Lateral lunge 3x8  HEP progression, kneeling anatomy/mechanics, healing timeline  10/16  Start: 113 end: 121 flexion   Grade IV L knee flexion mob with IR/ER bias  SL bridge 3x8   Standing diver with 5lbs (loaded stretch) 3x8  Wobble board AP and ML taps 2x20  Soleus calf raise in half lunge off 4" box 3x10 Standing hip ABD YTB at ankles 3x10 each Bridge on red ball with HS curl 2x10    10/14  Recumbent bike warm up 6 min  STM L hamstring Manual LAD with flexion mob in prone  Towel standing hamstring slider 2x5 Kneeling squat 2x6 Standing diver with R UE support (loaded stretch) 2x10 SL leg press 81lbs 5x5 shuttle (gym leg press  unavailable)  Lateral hurdle march 6x hurdles 4x laps  Airex step over and back (L SL balance) 2x10 Cannonball squat with heel prop 2x12 10/7   Recumbent bike warm up 6 min    STM L tib ant and fibularis group  SL leg press 65lbs 4x5 white machine  SL calf raise 30lbs on white leg press 3x10 SLS on airex with 5lb weight pass 2x20 5lb SL RDL 3x10 Tib ant raise with back on wall 2x15    10/7   Recumbent bike warm up 6 min   Cannonball squat 2x10  8" box runners step  up 3x8 fwd and lateral Machine calf raise 2x12 (knee straight) leg press 30lbs  SL leg press 15lbs 3x5 gray machine  SLS on airex with 5lb weight pass 3x10  10lb staggered RDL 3x12  Manual eccentric resisted knee ext and flexion 3x10     10/4:   Recumbent bike warm up 6 min  Incline stretch PROM L knee  Eccentric step downs fwd and lateral Step up with march- 4"+airex Retro eccentric step down   Knee ext machine single leg 15lbs 3x5 (intermittent R LE assist)  Bosu step up 3x10  Standing SLS with star reach 3x5 Prone HS curl 5lbs 3x8   9/30  Starting 115; Ending 122  Recumbent bike warm up 5 min   Grade IV tibial IR/ER bias flexion mob   Knee ext machine single leg 15lbs 3x5 (intermittent R LE assist)  Bosu step up 3x10  Standing gastroc/soleus stretch 30s 2x each  Deep cannonball squat with Ue assist 2x10 Standing SLS with star reach 3x5 Prone HS curl 5lbs 3x8   04/29/23 Manual: STM to L lateral gastroc pre and post dry needling for trigger point identification and muscular relaxation.  Trigger Point Dry-Needling  Treatment instructions: Expect mild to moderate muscle soreness. S/S of pneumothorax if dry needled over a lung field, and to seek immediate medical attention should they occur. Patient verbalized understanding of these instructions and education.   Patient Consent Given: Yes Education (verbally/handout)provided: Yes Muscles treated: L lateral gastroc and L lateral HS Electrical stimulation performed: none Parameters:   n/a Treatment response/outcome: several large LTR elicited; palpable increase in soft tissue extensibility Standing soleus stretch 3 x 20 second holds Cannonball/feet together squat  3x8 with table support and heel prop Shuttle SL leg press 50lbs 3x10 Split squat with RLE on table and unilateral UE support 2 x 10    9/23  Trigger Point Dry-Needling  Treatment instructions: Expect mild to moderate muscle soreness. S/S of  pneumothorax if dry needled over a lung field, and to seek immediate medical attention should they occur. Patient verbalized understanding of these instructions and education.   Patient Consent Given: Yes Education (verbally/handout)provided: Yes Muscles treated: L lateral gastroc and L lateral HS Electrical stimulation performed: none Parameters:   n/a Treatment response/outcome: several large LTR elicited; palpable increase in soft tissue extensibility  STM L gastroc and HS Cannonball/feet together squat  3x8 with table support and heel prop  Recovery, exercise progression, healing timeline, self STM    Starting 109;  9/18 Starting 111; 116 after manual  STM L adductor and medial HS group L knee Grade IV tibial AP glides in progressive flexion with tibial IR and ER  Self STM of adductor and HS with foam roller  Shuttle SL leg press 50lbs 3x6  Cannonball/feet together squat  3x8 with table support and heel prop       PATIENT EDUCATION:  Education details:  anatomy, exercise progression, DOMS expectations, muscle firing,  envelope of function, HEP, POC 04/10/23: hep scar mobilizations  Person educated: Patient Education method: Explanation, Demonstration, Tactile cues, Verbal cues, and Handouts Education comprehension: verbalized understanding, returned demonstration, verbal cues required, tactile cues required, and needs further education     HOME EXERCISE PROGRAM:  Access Code: [email or web address] URL: [email or web address]   ASSESSMENT:   CLINICAL IMPRESSION: Pt SL motor control exercise updated in HEP without adverse effects. No pain noted during session other than continued L posterior knee discomfort. Pt advised to continue with her workout classes to address compound/global movements and use HEP as accessories and supplemental exercise for targeted L knee and hip stability. Plan for final check and likely DC at next session if no further concerns or  issues. Pt would benefit from continued skilled therapy in order to reach goals and maximize functional L LE strength and ROM for return to PLOF.    OBJECTIVE IMPAIRMENTS decreased ROM, decreased strength, hypomobility, increased fascial restrictions, increased muscle spasms, impaired flexibility, improper body mechanics, postural dysfunction, and pain.    ACTIVITY LIMITATIONS carrying, lifting, sitting, stairs, locomotion level   PARTICIPATION LIMITATIONS: driving, shopping, community activity, occupation, and yard work   PERSONAL FACTORS Age, Time since onset of injury/illness/exacerbation, and 1 comorbidity:    are also affecting patient's functional outcome.        GOALS:   SHORT TERM GOALS: Target date: 04/02/2023     Pt will become independent with HEP in order to demonstrate synthesis of PT education.  Goal status: met   2.  Pt will be able to demonstrate at least 110 deg knee flexion  in order to demonstrate functional improvement in LE function for self-care and house hold duties.    Goal status: MET   3.  Pt will score at least 12 pt increase on FOTO to demonstrate functional improvement in MCII and pt perceived function.     Goal status: MET       LONG TERM GOALS: Target date: 07/20/2023      Pt  will become independent with final HEP in order to demonstrate synthesis of PT education.    Goal status: ongoing   2.  Pt will be able to demonstrate/report ability to walk >30 mins without pain in order to demonstrate functional improvement and tolerance to exercise and community mobility.    Goal status: met   3.  Pt will be able to demonstrate full depth squat in order to demonstrate functional improvement in LE function for self-care and house hold duties.  ongoing  4.  Pt will score >/= 60 on FOTO to demonstrate improvement in perceived L knee function.  Baseline:  Goal status: MET  5.  Pt will be able to demonstrate full L knee ROM in order to demonstrate  functional improvement in LE function for self-care and house hold duties.  ongoing   PLAN: PT FREQUENCY: 1-2x/week 12 wks   PT DURATION: POC date   PLANNED INTERVENTIONS: Therapeutic exercises, Therapeutic activity, Neuromuscular re-education, Balance training, Gait training, Patient/Family education, Self Care, Joint mobilization, Joint manipulation, Orthotic/Fit training, Aquatic Therapy, Dry Needling, Electrical stimulation, Spinal manipulation, Spinal mobilization, Cryotherapy, Moist heat, Compression bandaging, scar mobilization, Splintting, Taping, Vasopneumatic device, Traction, Ultrasound, Ionotophoresis 4mg /ml Dexamethasone, Manual therapy, and Re-evaluation   PLAN FOR NEXT SESSION: PROM into flexion, work back into there-ex as tolerated. Cupping/ DN PRN.    Zebedee Iba, PT 06/03/2023, 10:55 AM

## 2023-06-10 NOTE — Progress Notes (Signed)
67 y.o. G1P0 Married Caucasian female here for breast and pelvic exam.   Hx DES exposure.   Asking about her recent bone density result and the loss of bone density.  BMD 06/18/23: Osteopenia of bilateral hips 06/2023.  Normal spine. FRAX score:  15%/2.4%  Had left knee replacement.   Working on lowering her A1C.   PCP: Thana Ates, MD   Patient's last menstrual period was 08/04/2002 (approximate).           Sexually active: No.  The current method of family planning is post menopausal status.    Menopausal hormone therapy:  n/a Exercising: Yes.     Strength training 3x a week, cardio 4x a week Smoker:  former  OB History  Gravida Para Term Preterm AB Living  1 0       0  SAB IAB Ectopic Multiple Live Births               # Outcome Date GA Lbr Len/2nd Weight Sex Type Anes PTL Lv  1 Gravida              HEALTH MAINTENANCE:    Component Value Date/Time   DIAGPAP  06/18/2022 1402    - Negative for intraepithelial lesion or malignancy (NILM)   DIAGPAP  05/30/2021 0903    - Negative for intraepithelial lesion or malignancy (NILM)   DIAGPAP  02/13/2020 1351    - Negative for intraepithelial lesion or malignancy (NILM)   ADEQPAP  06/18/2022 1402    Satisfactory for evaluation. The presence or absence of an   ADEQPAP  06/18/2022 1402    endocervical/transformation zone component cannot be determined because   ADEQPAP of atrophy. 06/18/2022 1402    History of abnormal Pap or positive HPV:  no Mammogram: 04/17/23 Breast Density Cat B, BI-RADS CAT 1 neg Colonoscopy:  11/2016 normal Bone Density:  06/18/23  Result  osteopenia   Immunization History  Administered Date(s) Administered   Fluad Trivalent(High Dose 65+) 05/08/2023   PFIZER(Purple Top)SARS-COV-2 Vaccination 08/24/2019, 09/14/2019, 04/26/2020   Pfizer Covid-19 Vaccine Bivalent Booster 27yrs & up 12/05/2021      reports that she quit smoking about 47 years ago. Her smoking use included cigarettes. She has  never used smokeless tobacco. She reports that she does not currently use alcohol. She reports that she does not use drugs.  Past Medical History:  Diagnosis Date   Anxiety    Arthritis    Elevated hemoglobin A1c 2019   5.7   Family history of DES exposure    Fibrocystic breast    GERD (gastroesophageal reflux disease)    HPV (human papilloma virus) infection    Hypercholesterolemia    OCD (obsessive compulsive disorder)    Osteopenia 2024   bilateral hips   Pre-diabetes    S/P excision of lipoma     Past Surgical History:  Procedure Laterality Date   ANKLE SURGERY     broken as a child   BREAST BIOPSY Right    COLONOSCOPY     DILATION AND CURETTAGE OF UTERUS     LIPOMA EXCISION     rib cage   TOTAL KNEE ARTHROPLASTY Left 02/02/2023   Procedure: LEFT TOTAL KNEE ARTHROPLASTY;  Surgeon: Tarry Kos, MD;  Location: [facility] OR;  Service: Orthopedics;  Laterality: Left;   TRIGGER FINGER RELEASE Left 03/2013   thumb    Current Outpatient Medications  Medication Sig Dispense Refill   augmented betamethasone dipropionate (DIPROLENE-AF) 0.05 % ointment  Apply 1 Application topically as needed (skin rash).     CALCIUM PO Take 1,000 mg by mouth daily.     calcium-vitamin D (OSCAL WITH D) 500-5 MG-MCG tablet Take 1 tablet by mouth.     COVID-19 mRNA bivalent vaccine, Pfizer, (PFIZER COVID-19 VAC BIVALENT) injection Inject into the muscle. 0.3 mL 0   escitalopram (LEXAPRO) 10 MG tablet Take 10 mg by mouth daily.     influenza vaccine adjuvanted (FLUAD) 0.5 ML injection Inject into the muscle. 0.5 mL 0   methocarbamol (ROBAXIN-750) 750 MG tablet Take 1 tablet (750 mg total) by mouth 2 (two) times daily as needed for muscle spasms. 20 tablet 2   naproxen (NAPROSYN) 500 MG tablet Take 1 tablet (500 mg total) by mouth 2 (two) times daily with a meal. 30 tablet 3   Probiotic Product (ALIGN) 4 MG CAPS Take 1 capsule by mouth daily.     rivaroxaban (XARELTO) 10 MG TABS tablet Take 1 tablet (10 mg  total) by mouth daily. To be taken after surgery to prevent blood clots 30 tablet 0   No current facility-administered medications for this visit.    ALLERGIES: Aspirin, Cortisone, Esomeprazole, Lansoprazole, Omeprazole, Omeprazole-sodium bicarbonate, and Pantoprazole  Family History  Problem Relation Age of Onset   Breast cancer Mother    Osteoporosis Maternal Grandmother    Stroke Maternal Grandfather     Review of Systems  All other systems reviewed and are negative.   PHYSICAL EXAM:  BP 116/74 (BP Location: Left Arm, Patient Position: Sitting, Cuff Size: Normal)   Pulse 64   Ht 5' 2.75" (1.594 m)   Wt 137 lb (62.1 kg)   LMP 08/04/2002 (Approximate)   SpO2 97%   BMI 24.46 kg/m     General appearance: alert, cooperative and appears stated age Head: normocephalic, without obvious abnormality, atraumatic Neck: no adenopathy, supple, symmetrical, trachea midline and thyroid normal to inspection and palpation Lungs: clear to auscultation bilaterally Breasts: normal appearance, no masses or tenderness, No nipple retraction or dimpling, No nipple discharge or bleeding, No axillary adenopathy Heart: regular rate and rhythm Abdomen: soft, non-tender; no masses, no organomegaly Extremities: extremities normal, atraumatic, no cyanosis or edema Skin: skin color, texture, turgor normal. No rashes or lesions Lymph nodes: cervical, supraclavicular, and axillary nodes normal. Neurologic: grossly normal  Pelvic: External genitalia:  no lesions              No abnormal inguinal nodes palpated.              Urethra:  normal appearing urethra with no masses, tenderness or lesions              Bartholins and Skenes: normal                 Vagina: normal appearing vagina with normal color and discharge, no lesions.  Atrophy noted.               Cervix: no lesions              Pap taken: Yes.   Bimanual Exam:  Uterus:  normal size, contour, position, consistency, mobility, non-tender               Adnexa: no mass, fullness, tenderness              Rectal exam: Yes.  .  Confirms.              Anus:  normal sphincter tone, hemorrhoid noted, area  3 mm with firmness to it.  No obvious clot.   Chaperone was present for exam:  Warren Lacy, CMA  ASSESSMENT:  Encounter for breast and pelvic exam.  GYN exam for high risk Medicare patient. Hx DES exposure.  Vaginal atrophy.  FH of breast cancer.  Genetic testing negative in her mother.  Osteopenia.  FRAX numbers low.  Hemorrhoids. Hx elevated HgbA1C. Elevated cholesterol.  PLAN: Mammogram screening discussed. Self breast awareness reviewed. Pap and HRV collected:  Yes.   Guidelines for Calcium, Vitamin D, regular exercise program including cardiovascular and weight bearing exercise. Medication refills:  NA Bone density study reviewed.  Risk factors for bone loss and osteoporosis discussed.   Repeat BMD in 2 years.  We discussed her hemorrhoids and signs of thrombosed hemorrhoid. Follow up:  yearly and prn.     Additional counseling given.  Yes.  . 30 min  total time was spent for this patient encounter, including preparation, face-to-face counseling with the patient, coordination of care, and documentation of the encounter in addition to doing the breast and pelvic exam and pap.  An After Visit Summary was provided to the patient.

## 2023-06-17 ENCOUNTER — Ambulatory Visit (HOSPITAL_BASED_OUTPATIENT_CLINIC_OR_DEPARTMENT_OTHER): Payer: HMO | Attending: Orthopaedic Surgery | Admitting: Physical Therapy

## 2023-06-17 ENCOUNTER — Encounter (HOSPITAL_BASED_OUTPATIENT_CLINIC_OR_DEPARTMENT_OTHER): Payer: Self-pay | Admitting: Physical Therapy

## 2023-06-17 DIAGNOSIS — R262 Difficulty in walking, not elsewhere classified: Secondary | ICD-10-CM | POA: Insufficient documentation

## 2023-06-17 DIAGNOSIS — M6281 Muscle weakness (generalized): Secondary | ICD-10-CM | POA: Diagnosis not present

## 2023-06-17 DIAGNOSIS — M25562 Pain in left knee: Secondary | ICD-10-CM | POA: Diagnosis not present

## 2023-06-17 NOTE — Therapy (Signed)
OUTPATIENT PHYSICAL THERAPY LOWER EXTREMITY TREATMENT  PHYSICAL THERAPY DISCHARGE SUMMARY  Visits from Start of Care: 27   Plan: Patient agrees to discharge.  Patient goals were met. Patient is being discharged due to meeting the stated rehab goals.         Patient Name: Ashley Blair MRN: [id number] DOB:[birth year], 67 y.o., female Today's Date: 06/17/2023  END OF SESSION:  PT End of Session - 06/17/23 1559     Visit Number 27    Number of Visits 30    Date for PT Re-Evaluation 07/26/23    Authorization Type HTA    PT Start Time 1600    PT Stop Time 1630    PT Time Calculation (min) 30 min    Activity Tolerance Patient tolerated treatment well    Behavior During Therapy WFL for tasks assessed/performed                          Past Medical History:  Diagnosis Date   Anxiety    Arthritis    Elevated hemoglobin A1c 2019   5.7   Family history of DES exposure    Fibrocystic breast    GERD (gastroesophageal reflux disease)    HPV (human papilloma virus) infection    Hypercholesterolemia    OCD (obsessive compulsive disorder)    Pre-diabetes    S/P excision of lipoma    Past Surgical History:  Procedure Laterality Date   ANKLE SURGERY     broken as a child   BREAST BIOPSY Right    COLONOSCOPY     DILATION AND CURETTAGE OF UTERUS     LIPOMA EXCISION     rib cage   TOTAL KNEE ARTHROPLASTY Left 02/02/2023   Procedure: LEFT TOTAL KNEE ARTHROPLASTY;  Surgeon: Tarry Kos, MD;  Location: [facility] OR;  Service: Orthopedics;  Laterality: Left;   TRIGGER FINGER RELEASE Left 03/2013   thumb   Patient Active Problem List   Diagnosis Date Noted   Status post total left knee replacement 02/02/2023   Primary osteoarthritis of left knee 02/01/2023   Family history of breast cancer in mother 12/12/2013   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   SUBJECTIVE:   SUBJECTIVE STATEMENT: Pt states she had 1 moment of swelling last week that lasted for about a day or two. Pt states the back of the knee feels better and happens last frequently. Pt feels she is not quite at 100% but has improved.   PERTINENT HISTORY: N/A PAIN:  Are you having pain? No NPRS scale: 2-3/10 Pain location: posterior knee anterior knee  Pain description: "tight band feeling" Aggravating factors: bending Relieving factors: N/A, ice/rest   PRECAUTIONS: Knee  RED FLAGS: None   WEIGHT BEARING RESTRICTIONS: No  FALLS:  Has patient fallen in last 6 months? No  LIVING ENVIRONMENT: Lives with: lives with their family and lives with their spouse Lives in: House/apartment Stairs: Yes Has following equipment at home: Single  point cane  OCCUPATION: fitness instructor; Office manager  PLOF: Independent  PATIENT GOALS: return to normal exercise    OBJECTIVE:   DIAGNOSTIC FINDINGS: N/A  PATIENT SURVEYS:  FOTO 39 60 @ DC 12 MCII  FOTO 72% (GOAL MET 9/23)   COGNITION: Overall cognitive status: Within functional limits for tasks assessed     SENSATION: WFL  EDEMA:  Moderate, palpable edema anteriorly and posteriorly into popliteal fossa  LOWER EXTREMITY ROM:  Active ROM Right eval Left eval L 7/20 L 7/24 Lt 8/5 Lt  8/5 8/19 8/30 9/3 04/08/23 04/10/23 04/14/23 9/11 04/17/23 04/18/23 04/27/23 04/28/23 9/30 11/13  Hip flexion                     Hip extension                     Hip abduction                     Hip adduction                     Hip internal rotation                      Hip external rotation                     Knee flexion 125 75 78 86 95 91 101 CKC  107 105 90 at beginning of session, improves to 110 90 Improves to  117 92 at beginning of session, improves to 115 90  116 at end of session 101 102  116 at end of session 117 108 at beginning of session 122 120  Knee extension 3 -12 -5   1 0 0        0 0 0 0  Ankle dorsiflexion                     Ankle plantarflexion                     Ankle inversion                     Ankle eversion                      (Blank rows = not tested)  MMT (HHD in lbs)  Right 9/23 Left 9/23 R 11/13 L 11/13  Hip flexion      Hip extension      Hip abduction      Hip adduction      Hip internal rotation      Hip external rotation      Knee flexion 38.3, 38.8, 38.2 27.6, 33.5, 31.1 32.6, 40.0 35.5, 38.0    Knee extension 39.5, 43.6, 46.8 39.9, 42.1, 41.1 39.5, 43.0 39.9, 40.1  Ankle dorsiflexion      Ankle plantarflexion      Ankle inversion      Ankle eversion       (Blank rows = not tested)   TODAY'S TREATMENT:   11/13  Knee extensions at home Manual TKE mob grade IV  Exercises - Deep Squat with Arms Overhead  - 2 x daily - 2-3 x weekly - 1 sets - 10 reps - The Diver  - 1 x daily - 2-3 x weekly - 3 sets - 78 reps - Standing Hip Abduction  with Resistance at Ankles and Unilateral Counter Support  - 1 x daily - 2-3 x weekly - 3 sets - 10 reps - Reverse Nordic Curl  - 1 x daily - 2-3 x weekly - 3 sets - 5 reps - Single-Leg Anti-Rotation Press With Anchored Resistance  - 1 x daily - 2-3 x weekly - 2 sets - 10 reps - Forward Step Down Touch with Heel  - 1 x daily - 2-3 x weekly - 2 sets - 10 reps - Single Leg Heel Raise on Step  - 1 x daily - 2-3 x weekly - 3 sets - 10 reps  10/30 Recumbent bike warm up 6 min   RTB SL paloff 2x10 Fwd step down 8" 2x10 10lb SL RDL 3x8  10/23  Recumbent bike warm up 6 min   Wall lunge holds 3x8 3s  Reverse nordic 3x5 Lateral lunge 3x8  HEP progression,  kneeling anatomy/mechanics, healing timeline  10/16  Start: 113 end: 121 flexion   Grade IV L knee flexion mob with IR/ER bias  SL bridge 3x8   Standing diver with 5lbs (loaded stretch) 3x8  Wobble board AP and ML taps 2x20  Soleus calf raise in half lunge off 4" box 3x10 Standing hip ABD YTB at ankles 3x10 each Bridge on red ball with HS curl 2x10    10/14  Recumbent bike warm up 6 min  STM L hamstring Manual LAD with flexion mob in prone  Towel standing hamstring slider 2x5 Kneeling squat 2x6 Standing diver with R UE support (loaded stretch) 2x10 SL leg press 81lbs 5x5 shuttle (gym leg press unavailable)  Lateral hurdle march 6x hurdles 4x laps  Airex step over and back (L SL balance) 2x10 Cannonball squat with heel prop 2x12 10/7   Recumbent bike warm up 6 min    STM L tib ant and fibularis group  SL leg press 65lbs 4x5 white machine  SL calf raise 30lbs on white leg press 3x10 SLS on airex with 5lb weight pass 2x20 5lb SL RDL 3x10 Tib ant raise with back on wall 2x15    10/7   Recumbent bike warm up 6 min   Cannonball squat 2x10  8" box runners step up 3x8 fwd and lateral Machine calf raise 2x12 (knee straight) leg press 30lbs  SL leg press 15lbs 3x5 gray machine  SLS on airex with 5lb weight pass 3x10  10lb staggered RDL 3x12  Manual eccentric resisted knee ext and flexion 3x10     10/4:   Recumbent bike warm up 6 min  Incline stretch PROM L knee  Eccentric step downs fwd and lateral Step up with march- 4"+airex Retro eccentric step down   Knee ext machine single leg 15lbs 3x5 (intermittent R LE assist)  Bosu step up 3x10  Standing SLS with star reach 3x5 Prone HS curl 5lbs 3x8   9/30  Starting 115; Ending 122  Recumbent bike warm up 5 min   Grade IV tibial IR/ER bias flexion mob   Knee ext machine single leg 15lbs 3x5 (intermittent R LE assist)  Bosu step up 3x10  Standing gastroc/soleus stretch 30s 2x each  Deep cannonball  squat with Ue assist 2x10 Standing SLS with star reach 3x5 Prone HS curl 5lbs 3x8   04/29/23 Manual: STM to L lateral gastroc pre and post dry needling for trigger point identification and muscular relaxation.  Trigger Point Dry-Needling  Treatment instructions: Expect mild to moderate muscle soreness. S/S of pneumothorax if dry  needled over a lung field, and to seek immediate medical attention should they occur. Patient verbalized understanding of these instructions and education.   Patient Consent Given: Yes Education (verbally/handout)provided: Yes Muscles treated: L lateral gastroc and L lateral HS Electrical stimulation performed: none Parameters:   n/a Treatment response/outcome: several large LTR elicited; palpable increase in soft tissue extensibility Standing soleus stretch 3 x 20 second holds Cannonball/feet together squat  3x8 with table support and heel prop Shuttle SL leg press 50lbs 3x10 Split squat with RLE on table and unilateral UE support 2 x 10    9/23  Trigger Point Dry-Needling  Treatment instructions: Expect mild to moderate muscle soreness. S/S of pneumothorax if dry needled over a lung field, and to seek immediate medical attention should they occur. Patient verbalized understanding of these instructions and education.   Patient Consent Given: Yes Education (verbally/handout)provided: Yes Muscles treated: L lateral gastroc and L lateral HS Electrical stimulation performed: none Parameters:   n/a Treatment response/outcome: several large LTR elicited; palpable increase in soft tissue extensibility  STM L gastroc and HS Cannonball/feet together squat  3x8 with table support and heel prop  Recovery, exercise progression, healing timeline, self STM    Starting 109;  9/18 Starting 111; 116 after manual  STM L adductor and medial HS group L knee Grade IV tibial AP glides in progressive flexion with tibial IR and ER  Self STM of adductor and HS with  foam roller  Shuttle SL leg press 50lbs 3x6  Cannonball/feet together squat  3x8 with table support and heel prop       PATIENT EDUCATION:  Education details:  anatomy, exercise progression, DOMS expectations, muscle firing,  envelope of function, HEP, POC 04/10/23: hep scar mobilizations  Person educated: Patient Education method: Explanation, Demonstration, Tactile cues, Verbal cues, and Handouts Education comprehension: verbalized understanding, returned demonstration, verbal cues required, tactile cues required, and needs further education     HOME EXERCISE PROGRAM:  Access Code: [email or web address] URL: [email or web address]   ASSESSMENT:   CLINICAL IMPRESSION: Pt has met all PT goals at this time and has 100% knee strength as well as expeceted knee ROM. Pt still feels tightness across the joint with occacsional mild swelling but advised that this is a normal progression as she return to activity and exercise. FOTO goal exceeded. No further needs for PT. D/C this episode of care.   OBJECTIVE IMPAIRMENTS decreased ROM, decreased strength, hypomobility, increased fascial restrictions, increased muscle spasms, impaired flexibility, improper body mechanics, postural dysfunction, and pain.    ACTIVITY LIMITATIONS carrying, lifting, sitting, stairs, locomotion level   PARTICIPATION LIMITATIONS: driving, shopping, community activity, occupation, and yard work   PERSONAL FACTORS Age, Time since onset of injury/illness/exacerbation, and 1 comorbidity:    are also affecting patient's functional outcome.        GOALS:   SHORT TERM GOALS: Target date: 04/02/2023     Pt will become independent with HEP in order to demonstrate synthesis of PT education.  Goal status: met   2.  Pt will be able to demonstrate at least 110 deg knee flexion  in order to demonstrate functional improvement in LE function for self-care and house hold duties.    Goal status: MET   3.  Pt  will score at least 12 pt increase on FOTO to demonstrate functional improvement in MCII and pt perceived function.     Goal status: MET       LONG TERM GOALS: Target date: 07/20/2023  Pt  will become independent with final HEP in order to demonstrate synthesis of PT education.    Goal status: MET   2.  Pt will be able to demonstrate/report ability to walk >30 mins without pain in order to demonstrate functional improvement and tolerance to exercise and community mobility.    Goal status: met   3.  Pt will be able to demonstrate full depth squat in order to demonstrate functional improvement in LE function for self-care and house hold duties.  ongoing  4.  Pt will score >/= 60 on FOTO to demonstrate improvement in perceived L knee function.  Baseline:  Goal status: MET  5.  Pt will be able to demonstrate full L knee ROM in order to demonstrate functional improvement in LE function for self-care and house hold duties.  MET   PLAN: PT FREQUENCY: 1-2x/week 12 wks   PT DURATION: POC date   PLANNED INTERVENTIONS: Therapeutic exercises, Therapeutic activity, Neuromuscular re-education, Balance training, Gait training, Patient/Family education, Self Care, Joint mobilization, Joint manipulation, Orthotic/Fit training, Aquatic Therapy, Dry Needling, Electrical stimulation, Spinal manipulation, Spinal mobilization, Cryotherapy, Moist heat, Compression bandaging, scar mobilization, Splintting, Taping, Vasopneumatic device, Traction, Ultrasound, Ionotophoresis 4mg /ml Dexamethasone, Manual therapy, and Re-evaluation   PLAN FOR NEXT SESSION: PROM into flexion, work back into there-ex as tolerated. Cupping/ DN PRN.    Zebedee Iba, PT 06/17/2023, 4:39 PM

## 2023-06-18 DIAGNOSIS — Z8262 Family history of osteoporosis: Secondary | ICD-10-CM | POA: Diagnosis not present

## 2023-06-18 DIAGNOSIS — N958 Other specified menopausal and perimenopausal disorders: Secondary | ICD-10-CM | POA: Diagnosis not present

## 2023-06-18 DIAGNOSIS — M8588 Other specified disorders of bone density and structure, other site: Secondary | ICD-10-CM | POA: Diagnosis not present

## 2023-06-19 ENCOUNTER — Encounter: Payer: Self-pay | Admitting: Obstetrics and Gynecology

## 2023-06-20 ENCOUNTER — Encounter: Payer: Self-pay | Admitting: Obstetrics and Gynecology

## 2023-06-24 ENCOUNTER — Encounter: Payer: Self-pay | Admitting: Obstetrics and Gynecology

## 2023-06-24 ENCOUNTER — Ambulatory Visit (INDEPENDENT_AMBULATORY_CARE_PROVIDER_SITE_OTHER): Payer: PPO | Admitting: Obstetrics and Gynecology

## 2023-06-24 ENCOUNTER — Other Ambulatory Visit (HOSPITAL_COMMUNITY)
Admission: RE | Admit: 2023-06-24 | Discharge: 2023-06-24 | Disposition: A | Discharge status: Home / Self Care | Payer: HMO | Source: Ambulatory Visit | Attending: Obstetrics and Gynecology | Admitting: Obstetrics and Gynecology

## 2023-06-24 VITALS — BP 116/74 | HR 64 | Ht 62.75 in | Wt 137.0 lb

## 2023-06-24 DIAGNOSIS — Z124 Encounter for screening for malignant neoplasm of cervix: Secondary | ICD-10-CM

## 2023-06-24 DIAGNOSIS — Z9189 Other specified personal risk factors, not elsewhere classified: Secondary | ICD-10-CM | POA: Diagnosis not present

## 2023-06-24 DIAGNOSIS — N952 Postmenopausal atrophic vaginitis: Secondary | ICD-10-CM | POA: Diagnosis not present

## 2023-06-24 DIAGNOSIS — M8589 Other specified disorders of bone density and structure, multiple sites: Secondary | ICD-10-CM

## 2023-06-24 NOTE — Patient Instructions (Addendum)
EXERCISE AND DIET:  We recommended that you start or continue a regular exercise program for good health. Regular exercise means any activity that makes your heart beat faster and makes you sweat.  We recommend exercising at least 30 minutes per day at least 3 days a week, preferably 4 or 5.  We also recommend a diet low in fat and sugar.  Inactivity, poor dietary choices and obesity can cause diabetes, heart attack, stroke, and kidney damage, among others.    ALCOHOL AND SMOKING:  Women should limit their alcohol intake to no more than 7 drinks/beers/glasses of wine (combined, not each!) per week. Moderation of alcohol intake to this level decreases your risk of breast cancer and liver damage. And of course, no recreational drugs are part of a healthy lifestyle.  And absolutely no smoking or even second hand smoke. Most people know smoking can cause heart and lung diseases, but did you know it also contributes to weakening of your bones? Aging of your skin?  Yellowing of your teeth and nails?  CALCIUM AND VITAMIN D:  Adequate intake of calcium and Vitamin D are recommended.  The recommendations for exact amounts of these supplements seem to change often, but generally speaking 600 mg of calcium (either carbonate or citrate) and 800 units of Vitamin D per day seems prudent. Certain women may benefit from higher intake of Vitamin D.  If you are among these women, your doctor will have told you during your visit.    PAP SMEARS:  Pap smears, to check for cervical cancer or precancers,  have traditionally been done yearly, although recent scientific advances have shown that most women can have pap smears less often.  However, every woman still should have a physical exam from her gynecologist every year. It will include a breast check, inspection of the vulva and vagina to check for abnormal growths or skin changes, a visual exam of the cervix, and then an exam to evaluate the size and shape of the uterus and  ovaries.  And after 69 years of age, a rectal exam is indicated to check for rectal cancers. We will also provide age appropriate advice regarding health maintenance, like when you should have certain vaccines, screening for sexually transmitted diseases, bone density testing, colonoscopy, mammograms, etc.   MAMMOGRAMS:  All women over 32 years old should have a yearly mammogram. Many facilities now offer a "3D" mammogram, which may cost around $50 extra out of pocket. If possible,  we recommend you accept the option to have the 3D mammogram performed.  It both reduces the number of women who will be called back for extra views which then turn out to be normal, and it is better than the routine mammogram at detecting truly abnormal areas.    COLONOSCOPY:  Colonoscopy to screen for colon cancer is recommended for all women at age 10.  We know, you hate the idea of the prep.  We agree, BUT, having colon cancer and not knowing it is worse!!  Colon cancer so often starts as a polyp that can be seen and removed at colonscopy, which can quite literally save your life!  And if your first colonoscopy is normal and you have no family history of colon cancer, most women don't have to have it again for 10 years.  Once every ten years, you can do something that may end up saving your life, right?  We will be happy to help you get it scheduled when you are ready.  Be sure to check your insurance coverage so you understand how much it will cost.  It may be covered as a preventative service at no cost, but you should check your particular policy.    Hemorrhoids Hemorrhoids are swollen veins in and around the rectum or the opening of the butt (anus). There are two types of hemorrhoids: Internal. These occur in the veins just inside the rectum. They may poke through to the outside and become irritated and painful. External. These occur in the veins outside the anus. They can be felt as a painful swelling or hard lump near  the anus. Most hemorrhoids do not cause severe problems. Often, they can be treated at home with diet and lifestyle changes. If home treatments do not help, you may need a procedure to shrink or remove the hemorrhoids. What are the causes? Hemorrhoids are caused by pressure near the anus. This pressure may be caused by: Constipation or diarrhea. Straining to poop. Pregnancy. Obesity. Sitting or riding a bike for a long time. Heavy lifting or other things that cause you to strain. Anal sex. What are the signs or symptoms? Symptoms of this condition include: Pain. Anal itching or irritation. Bleeding from the rectum. Leakage of poop (stool). Swelling of the anus. One or more lumps around the anus. How is this diagnosed? Hemorrhoids can often be diagnosed through a visual exam. Other exams or tests may also be done, such as: A digital rectal exam. This is when your health care provider feels inside your rectum with a gloved finger. Anoscope. This is an exam of the anus using a small tube. A blood test, if you have lost a lot of blood. A sigmoidoscopy or colonoscopy. These are tests to look inside the colon using a tube with a camera on the end. How is this treated? In most cases, hemorrhoids can be treated at home with diet and lifestyle changes. If these changes do not help, you may need to have a procedure done. These procedures can make the hemorrhoids smaller or fully remove them. Common procedures include: Rubber band ligation. Rubber bands are placed at the base of the hemorrhoids to cut off their blood supply. Sclerotherapy. Medicine is put into the hemorrhoids to shrink them. Infrared coagulation. A type of light energy is used to get rid of the hemorrhoids. Hemorrhoidectomy surgery. The hemorrhoids are removed during surgery. Then, the veins that supply them are tied off. Stapled hemorrhoidopexy surgery. The base of the hemorrhoid is stapled to the wall of the rectum. Follow  these instructions at home: Medicines Take over-the-counter and prescription medicines only as told by your provider. Use medicated creams or medicines that are put in the rectum (suppositories) as told by your provider. Eating and drinking  Eat foods that are high in fiber, such as beans, whole grains, and fresh fruits and vegetables. Ask your provider about taking products that have fiber added to them (fiber supplements). Reduce the amount of fat in your diet. You can do this by eating low-fat dairy products, eating less red meat, and avoiding processed foods. Drink enough fluid to keep your pee (urine) pale yellow. Managing pain and swelling  Take warm sitz baths for 20 minutes, 3-4 times a day. This can help ease pain and discomfort. You may do this in a bathtub or you can use a portable sitz bath that fits over the toilet. If told, put ice on the affected area. It may help to use ice packs between sitz baths. Put  ice in a plastic bag. Place a towel between your skin and the bag. Leave the ice on for 20 minutes, 2-3 times a day. If your skin turns bright red, remove the ice right away to prevent skin damage. The risk of damage is higher if you cannot feel pain, heat, or cold. General instructions Exercise. Ask your provider how much and what kind of exercise is best for you. In general, you should do moderate exercise for at least 30 minutes on most days of the week (150 minutes each week). You may want to try walking, biking, or yoga. Go to the bathroom when you have the urge to poop. Do not wait. Avoid straining to poop. Keep the anus dry and clean. Use wet toilet paper or moist towelettes after you poop. Do not sit on the toilet for a long time. This can increase blood pooling and pain. Where to find more information [employer] of Diabetes and Digestive and Kidney Diseases: [email or web address] Contact a health care provider if: You have more pain and swelling that do not get  better with treatment. You have trouble pooping or you are not able to poop. You have pain or inflammation outside the area of the hemorrhoids. Get help right away if: You are bleeding from your rectum and you cannot get it to stop. This information is not intended to replace advice given to you by your health care provider. Make sure you discuss any questions you have with your health care provider. Document Revised: 04/02/2022 Document Reviewed: 04/02/2022 Elsevier Patient Education  2024 [employer]   Calcium Content in Foods Calcium is the most abundant mineral in the body. Most of the body's calcium supply is stored in bones and teeth. Calcium helps many parts of the body function normally, including: Blood and blood vessels. Nerves. Hormones. Muscles. Bones and teeth. When your calcium stores are low, you may be at risk for low bone mass, bone loss, and broken bones (fractures). When you get enough calcium, it helps to support strong bones and teeth throughout your life. Calcium is especially important for: Children during growth spurts. Girls during adolescence. Women who are pregnant or breastfeeding. Women after their menstrual cycle stops (postmenopause). Women whose menstrual cycle has stopped due to anorexia nervosa or regular intense exercise. People who cannot eat or digest dairy products. Vegans. Recommended daily amounts of calcium: Women (ages 76 to 47): 1,000 mg per day. Women (ages 60 and older): 1,200 mg per day. Men (ages 29 to 59): 1,000 mg per day. Men (ages 51 and older): 1,200 mg per day. Women (ages 65 to 46): 1,300 mg per day. Men (ages 35 to 31): 1,300 mg per day. General information Eat foods that are high in calcium. Try to get most of your calcium from food. Some people may benefit from taking calcium supplements. Check with your health care provider or diet and nutrition specialist (dietitian) before starting any calcium supplements. Calcium  supplements may interact with certain medicines. Too much calcium may cause other health problems, such as constipation and kidney stones. For the body to absorb calcium, it needs vitamin D. Sources of vitamin D include: Skin exposure to direct sunlight. Foods, such as egg yolks, liver, mushrooms, saltwater fish, and fortified milk. Vitamin D supplements. Check with your health care provider or dietitian before starting any vitamin D supplements. What foods are high in calcium?  Foods that are high in calcium contain more than 100 milligrams per serving. Fruits Fortified orange juice or  other fruit juice, 300 mg per 8 oz serving. Vegetables Collard greens, 360 mg per 8 oz serving. Kale, 100 mg per 8 oz serving. Bok choy, 160 mg per 8 oz serving. Grains Fortified ready-to-eat cereals, 100 to 1,000 mg per 8 oz serving. Fortified frozen waffles, 200 mg in 2 waffles. Oatmeal, 140 mg in 1 cup. Meats and other proteins Sardines, canned with bones, 325 mg per 3 oz serving. Salmon, canned with bones, 180 mg per 3 oz serving. Canned shrimp, 125 mg per 3 oz serving. Baked beans, 160 mg per 4 oz serving. Tofu, firm, made with calcium sulfate, 253 mg per 4 oz serving. Dairy Yogurt, plain, low-fat, 310 mg per 6 oz serving. Nonfat milk, 300 mg per 8 oz serving. American cheese, 195 mg per 1 oz serving. Cheddar cheese, 205 mg per 1 oz serving. Cottage cheese 2%, 105 mg per 4 oz serving. Fortified soy, rice, or almond milk, 300 mg per 8 oz serving. Mozzarella, part skim, 210 mg per 1 oz serving. The items listed above may not be a complete list of foods high in calcium. Actual amounts of calcium may be different depending on processing. Contact a dietitian for more information. What foods are lower in calcium? Foods that are lower in calcium contain 50 mg or less per serving. Fruits Apple, about 6 mg. Banana, about 12 mg. Vegetables Lettuce, 19 mg per 2 oz serving. Tomato, about 11  mg. Grains Rice, 4 mg per 6 oz serving. Boiled potatoes, 14 mg per 8 oz serving. White bread, 6 mg per slice. Meats and other proteins Egg, 27 mg per 2 oz serving. Red meat, 7 mg per 4 oz serving. Chicken, 17 mg per 4 oz serving. Fish, cod, or trout, 20 mg per 4 oz serving. Dairy Cream cheese, regular, 14 mg per 1 Tbsp serving. Brie cheese, 50 mg per 1 oz serving. Parmesan cheese, 70 mg per 1 Tbsp serving. The items listed above may not be a complete list of foods lower in calcium. Actual amounts of calcium may be different depending on processing. Contact a dietitian for more information. Summary Calcium is an important mineral in the body because it affects many functions. Getting enough calcium helps support strong bones and teeth throughout your life. Try to get most of your calcium from food. Calcium supplements may interact with certain medicines. Check with your health care provider or dietitian before starting any calcium supplements. This information is not intended to replace advice given to you by your health care provider. Make sure you discuss any questions you have with your health care provider. Document Revised: 11/16/2019 Document Reviewed: 11/16/2019 Elsevier Patient Education  2024 [employer].

## 2023-06-26 LAB — CYTOLOGY - PAP: Diagnosis: NEGATIVE

## 2023-07-22 DIAGNOSIS — M4802 Spinal stenosis, cervical region: Secondary | ICD-10-CM | POA: Diagnosis not present

## 2023-07-29 ENCOUNTER — Other Ambulatory Visit: Payer: Self-pay | Admitting: Medical Genetics

## 2023-08-12 ENCOUNTER — Ambulatory Visit: Payer: HMO | Admitting: Orthopaedic Surgery

## 2023-08-12 ENCOUNTER — Other Ambulatory Visit (INDEPENDENT_AMBULATORY_CARE_PROVIDER_SITE_OTHER): Payer: HMO

## 2023-08-12 ENCOUNTER — Encounter: Payer: Self-pay | Admitting: Orthopaedic Surgery

## 2023-08-12 DIAGNOSIS — Z96652 Presence of left artificial knee joint: Secondary | ICD-10-CM

## 2023-08-12 NOTE — Progress Notes (Signed)
 Office Visit Note   Patient: Ashley Blair           Date of Birth: November 06, 1955           MRN: [id number] Visit Date: 08/12/2023              Requested by: Dwight Trula SQUIBB, MD [address] [place],  [place] [address] PCP: Dwight Trula SQUIBB, MD   Assessment & Plan: Visit Diagnoses:  1. Status post total left knee replacement     Plan: Winton is 6 months status post left total knee replacement.  The knee is functioning well for her.  Reports some swelling and discomfort with increased activity but she has resumed all of the activities that she wants to do.  The knee feels solid on exam and the implant looks really good on x-rays.  Recheck in 6 months with x-rays.  Dental prophylaxis reinforced.  Follow-Up Instructions: Return in about 6 months (around 02/09/2024).   Orders:  Orders Placed This Encounter  Procedures   XR KNEE 3 VIEW LEFT   No orders of the defined types were placed in this encounter.     Procedures: No procedures performed   Clinical Data: No additional findings.   Subjective: Chief Complaint  Patient presents with   Left Knee - Pain, Follow-up    HPI Winton returns today for 98-month postop check. Review of Systems   Objective: Vital Signs: LMP 08/04/2002 (Approximate)   Physical Exam  Ortho Exam Exam of the left knee shows full range of motion.  Stable collaterals.  Fully healed surgical scar. Specialty Comments:  No specialty comments available.  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 12/12/2013   Past Medical History:  Diagnosis Date   Anxiety    Arthritis    Elevated hemoglobin  A1c 2019   5.7   Family history of DES exposure    Fibrocystic breast    GERD (gastroesophageal reflux disease)    HPV (human papilloma virus) infection    Hypercholesterolemia    OCD (obsessive compulsive disorder)    Osteopenia 2024   bilateral hips   Pre-diabetes    S/P excision of lipoma     Family History  Problem Relation Age of Onset   Breast cancer Mother    Osteoporosis Maternal Grandmother    Stroke Maternal Grandfather     Past Surgical History:  Procedure Laterality Date   ANKLE SURGERY     broken as a child   BREAST BIOPSY Right    COLONOSCOPY     DILATION AND CURETTAGE OF UTERUS     LIPOMA EXCISION     rib cage   TOTAL KNEE ARTHROPLASTY Left 02/02/2023   Procedure: LEFT TOTAL KNEE ARTHROPLASTY;  Surgeon: Jerri Kay HERO, MD;  Location: [facility] OR;  Service: Orthopedics;  Laterality: Left;   TRIGGER FINGER RELEASE Left 03/2013   thumb   Social History   Occupational History   Not on file  Tobacco Use   Smoking status: Former    Current packs/day: 0.00    Types: Cigarettes    Quit date: 12/07/1975    Years since quitting: 7.7  Smokeless tobacco: Never  Vaping Use   Vaping status: Never Used  Substance and Sexual Activity   Alcohol use: Not Currently    Comment: rarely   Drug use: No   Sexual activity: Not Currently    Partners: Male    Birth control/protection: Post-menopausal

## 2023-09-03 ENCOUNTER — Other Ambulatory Visit (HOSPITAL_COMMUNITY)
Admission: RE | Admit: 2023-09-03 | Discharge: 2023-09-03 | Disposition: A | Discharge status: Home / Self Care | Payer: Self-pay | Source: Ambulatory Visit | Attending: Oncology | Admitting: Oncology

## 2023-09-14 LAB — GENECONNECT MOLECULAR SCREEN: Genetic Analysis Overall Interpretation: NEGATIVE

## 2023-10-07 DIAGNOSIS — H9313 Tinnitus, bilateral: Secondary | ICD-10-CM | POA: Diagnosis not present

## 2023-10-07 DIAGNOSIS — H903 Sensorineural hearing loss, bilateral: Secondary | ICD-10-CM | POA: Diagnosis not present

## 2023-10-12 ENCOUNTER — Other Ambulatory Visit: Payer: Self-pay

## 2023-10-12 ENCOUNTER — Ambulatory Visit (HOSPITAL_BASED_OUTPATIENT_CLINIC_OR_DEPARTMENT_OTHER): Payer: HMO | Attending: Internal Medicine | Admitting: Physical Therapy

## 2023-10-12 ENCOUNTER — Encounter (HOSPITAL_BASED_OUTPATIENT_CLINIC_OR_DEPARTMENT_OTHER): Payer: Self-pay | Admitting: Physical Therapy

## 2023-10-12 DIAGNOSIS — M542 Cervicalgia: Secondary | ICD-10-CM | POA: Diagnosis not present

## 2023-10-12 NOTE — Therapy (Signed)
 OUTPATIENT PHYSICAL THERAPY CERVICAL EVALUATION    Patient Name: Ashley Blair MRN: [id number] DOB:18-Nov-1955, 68 y.o., female Today's Date: 10/12/2023  END OF SESSION:  PT End of Session - 10/12/23 1255     Visit Number 1    Number of Visits 10    Date for PT Re-Evaluation 01/10/24    Authorization Type HTA    PT Start Time 0845    PT Stop Time 0930    PT Time Calculation (min) 45 min    Activity Tolerance Patient tolerated treatment well    Behavior During Therapy [facility] for tasks assessed/performed                           Past Medical History:  Diagnosis Date   Anxiety    Arthritis    Elevated hemoglobin A1c 2019   5.7   Family history of DES exposure    Fibrocystic breast    GERD (gastroesophageal reflux disease)    HPV (human papilloma virus) infection    Hypercholesterolemia    OCD (obsessive compulsive disorder)    Osteopenia 2024   bilateral hips   Pre-diabetes    S/P excision of lipoma    Past Surgical History:  Procedure Laterality Date   ANKLE SURGERY     broken as a child   BREAST BIOPSY Right    COLONOSCOPY     DILATION AND CURETTAGE OF UTERUS     LIPOMA EXCISION     rib cage   TOTAL KNEE ARTHROPLASTY Left 02/02/2023   Procedure: LEFT TOTAL KNEE ARTHROPLASTY;  Surgeon: Tarry Kos, MD;  Location: [facility] OR;  Service: Orthopedics;  Laterality: Left;   TRIGGER FINGER RELEASE Left 03/2013   thumb   Patient Active Problem List   Diagnosis Date Noted   Status post total left knee replacement 02/02/2023   Primary osteoarthritis of left knee 02/01/2023   Family history of breast cancer in mother 12/12/2013   H/O diethylstilbestrol (DES) exposure in utero 12/12/2013    PCP:  Thana Ates, MD       REFERRING PROVIDER:  Thana Ates, MD      REFERRING DIAG:  M54.2 (ICD-10-CM) - Cervicalgia       THERAPY DIAG:  Cervicalgia - Plan: PT plan of care cert/re-cert  Rationale for Evaluation and Treatment:  Rehabilitation  ONSET DATE: >1 year   Days since surgery: 53   Operative procedure: Left total knee arthroplasty.   SUBJECTIVE:   SUBJECTIVE STATEMENT: Pt notes that the L sided neck has returned into the L UT. No NT into the hands. Turning to the R is painful with increase in pain with L rotation. Pt notes neck stiffness that limites ADL, driving, and turning. Pt is still doing barre, pilates but feels it tightens after. Pt is here to avoid worsening of pain like previous episode. Pt has to turn shoulders to see and drive. Denies 5 D's 3 N's.   PERTINENT HISTORY: N/A PAIN:  Are you having pain? Yes NPRS scale: 2-3/10 Pain location: L sided neck pain Pain description: sore ache Aggravating factors: rotation, flexion Relieving factors: naproxen  PRECAUTIONS: Knee  RED FLAGS: None   WEIGHT BEARING RESTRICTIONS: No  FALLS:  Has patient fallen in last 6 months? No  LIVING ENVIRONMENT: Lives with: lives with their family and lives with their spouse Lives in: House/apartment   OCCUPATION: fitness Secondary school teacher; Office manager (Biochemist, clinical)   PLOF: Independent  PATIENT GOALS: return  to normal exercise and ADL   OBJECTIVE:   DIAGNOSTIC FINDINGS: N/A  PATIENT SURVEYS:  NDI   Neck Disability Index score: 10 / 50 = 20.0 %  COGNITION: Overall cognitive status: Within functional limits for tasks assessed     SENSATION: WFL  EDEMA:   N/A   PALPATION:  TTP and hypertoncity of bilat C/S paraspinals, UT, and levator   CERVICAL ROM:   Active ROM A/PROM (deg) eval  Flexion 90%  Extension 50% p!  Right lateral flexion 50%  Left lateral flexion 25%  Right rotation 25%  Left rotation 15% p!   (Blank rows = not tested)   AROM Right eval  Left Eval   Shoulder flexion [facility]  Shoulder extension    Shoulder abduction [facility]  Shoulder adduction    Shoulder internal rotation [facility]  Shoulder external rotation [facility]  Elbow flexion    Elbow extension     Wrist flexion    Wrist extension    Wrist ulnar deviation    Wrist radial deviation    Wrist pronation    Wrist supination    (Blank rows = not tested)   TODAY'S TREATMENT:   3/10  Trigger Point Dry Needling  Subsequent Treatment: Instructions reviewed, if requested by the patient, prior to subsequent dry needling treatment.  (Pt is known to clinic and TPDN edu provided at many previous sessions)   Patient Verbal Consent Given: Yes Education Handout Provided: Previously Provided Muscles Treated: C3-4 multifidi and paraspinals bilat; L UT Electrical Stimulation Performed: No Treatment Response/Outcome: several large LTR elicited, improvement in ROM    STM muscles treated above Manual traction CPA C3-6 grade III; UPA bilat C3-6 grade III  Program Notes Banded Upper trap stretch (shrug and then tilt head away from hand as you slowly lower shoulder) 10x 2-3s hold  Exercises - Seated Cervical Retraction  - 2-3 x daily - 7 x weekly - 1 sets - 10 reps - Seated Assisted Cervical Rotation with Towel  - 2 x daily - 7 x weekly - 1 sets - 10 reps  Education details:  anatomy, exercise progression, DOMS expectations, muscle firing,  envelope of function, HEP, POC  Education method: Explanation, Demonstration, Tactile cues, Verbal cues, and Handouts Education comprehension: verbalized understanding, returned demonstration, verbal cues required, tactile cues required, and needs further education     HOME EXERCISE PROGRAM: Access Code: [email or web address] URL: [email or web address] Date: 10/12/2023 Prepared by: Zebedee Iba   ASSESSMENT:   CLINICAL IMPRESSION:  Patient is a 68 y.o. female who was seen today for physical therapy evaluation and treatment for c/c of neck pain. Pt's s/s appear consistent with mechanical due to signs of C/S OA and muscle hypertonicity. Pt is well known to therapy but has had a reoccurrence of neck pain that has limited her functional ability.  Pt with  moderately  irritable pain during session. Pt's limitation appear largely to be to joint stiffness related, causing localized muscle spasm.  Pt does respond well to TPDN and manual therapy followed by exercise. Pt would benefit from continued skilled therapy in order to reach goals and maximize functional C/S strength and ROM for full return to PLOF.    OBJECTIVE IMPAIRMENTS decreased ROM, decreased strength, hypomobility, increased fascial restrictions, increased muscle spasms, impaired flexibility, improper body mechanics, postural dysfunction, and pain.    ACTIVITY LIMITATIONS carrying, lifting, sitting   PARTICIPATION LIMITATIONS: driving, shopping, community activity, occupation, and yard work   PERSONAL FACTORS Age, Time since onset  of injury/illness/exacerbation, and 1 comorbidity:    are also affecting patient's functional outcome.    REHAB POTENTIAL: Good   CLINICAL DECISION MAKING: Stable/uncomplicated   EVALUATION COMPLEXITY: Low     GOALS:   SHORT TERM GOALS: Target date: 11/23/2023      Pt will become independent with HEP in order to demonstrate synthesis of PT education.  Goal status: INITIAL   2.  Pt will report at least 2 pt reduction on NPRS scale for pain in order to demonstrate functional improvement with household activity, self care, and ADL.    Goal status: INITIAL       LONG TERM GOALS: Target date: 01/10/24     Pt  will become independent with final HEP in order to demonstrate synthesis of PT education.    Goal status: INITIAL   2.  Pt will be able to demonstrate at least 20% improvement in C/S ROM without pain in order to demonstrate functional improvement in C/s function for return to PLOF.   Goal status: INITIAL   3.  Pt will be able to demonstrate/report ability to sit/stand/sleep for extended periods of time without pain in order to demonstrate functional improvement and tolerance to static positioning.  Goal status: INITIAL   4. Pt will  demonstrate at least a 7.5 improvement in Neck Disability Index in order to demonstrate a clinically significant change in neck pain and function.  Baseline:  Goal status: INITIAL   PLAN: PT FREQUENCY: 1-2x/week   PT DURATION: 12 weeks (likely every other week)   PLANNED INTERVENTIONS: Therapeutic exercises, Therapeutic activity, Neuromuscular re-education, Balance training, Gait training, Patient/Family education, Self Care, Joint mobilization, Joint manipulation, Orthotic/Fit training, Aquatic Therapy, Dry Needling, Electrical stimulation, Spinal manipulation, Spinal mobilization, Cryotherapy, Moist heat, Compression bandaging, scar mobilization, Splintting, Taping, Vasopneumatic device, Traction, Ultrasound, Ionotophoresis 4mg /ml Dexamethasone, Manual therapy, and Re-evaluation   PLAN FOR NEXT SESSION: joint mobs, TPDN, C/S stretching, LAD     Zebedee Iba, PT 10/12/2023, 1:08 PM

## 2023-10-13 DIAGNOSIS — M1811 Unilateral primary osteoarthritis of first carpometacarpal joint, right hand: Secondary | ICD-10-CM | POA: Diagnosis not present

## 2023-10-19 ENCOUNTER — Ambulatory Visit (HOSPITAL_BASED_OUTPATIENT_CLINIC_OR_DEPARTMENT_OTHER): Payer: Self-pay | Admitting: Physical Therapy

## 2023-10-19 ENCOUNTER — Encounter (HOSPITAL_BASED_OUTPATIENT_CLINIC_OR_DEPARTMENT_OTHER): Payer: Self-pay | Admitting: Physical Therapy

## 2023-10-19 DIAGNOSIS — M542 Cervicalgia: Secondary | ICD-10-CM | POA: Diagnosis not present

## 2023-10-19 NOTE — Therapy (Signed)
 OUTPATIENT PHYSICAL THERAPY CERVICAL EVALUATION    Patient Name: Ashley Blair MRN: [id number] DOB:Apr 26, 1956, 68 y.o., female Today's Date: 10/19/2023  END OF SESSION:  PT End of Session - 10/19/23 1614     Visit Number 2    Number of Visits 10    Date for PT Re-Evaluation 01/10/24    Authorization Type HTA    PT Start Time 1531    PT Stop Time 1610    PT Time Calculation (min) 39 min    Activity Tolerance Patient tolerated treatment well    Behavior During Therapy WFL for tasks assessed/performed                            Past Medical History:  Diagnosis Date   Anxiety    Arthritis    Elevated hemoglobin A1c 2019   5.7   Family history of DES exposure    Fibrocystic breast    GERD (gastroesophageal reflux disease)    HPV (human papilloma virus) infection    Hypercholesterolemia    OCD (obsessive compulsive disorder)    Osteopenia 2024   bilateral hips   Pre-diabetes    S/P excision of lipoma    Past Surgical History:  Procedure Laterality Date   ANKLE SURGERY     broken as a child   BREAST BIOPSY Right    COLONOSCOPY     DILATION AND CURETTAGE OF UTERUS     LIPOMA EXCISION     rib cage   TOTAL KNEE ARTHROPLASTY Left 02/02/2023   Procedure: LEFT TOTAL KNEE ARTHROPLASTY;  Surgeon: Tarry Kos, MD;  Location: [facility] OR;  Service: Orthopedics;  Laterality: Left;   TRIGGER FINGER RELEASE Left 03/2013   thumb   Patient Active Problem List   Diagnosis Date Noted   Status post total left knee replacement 02/02/2023   Primary osteoarthritis of left knee 02/01/2023   Family history of breast cancer in mother 12/12/2013   H/O diethylstilbestrol (DES) exposure in utero 12/12/2013    PCP:  Thana Ates, MD       REFERRING PROVIDER:  Thana Ates, MD      REFERRING DIAG:  M54.2 (ICD-10-CM) - Cervicalgia       THERAPY DIAG:  Cervicalgia  Rationale for Evaluation and Treatment: Rehabilitation  ONSET DATE: >1 year  SUBJECTIVE:    SUBJECTIVE STATEMENT:  Pt reports less pain since last session. Stiffness persists  but pain at basline is better.   Eval:  Pt notes that the L sided neck has returned into the L UT. No NT into the hands. Turning to the R is painful with increase in pain with L rotation. Pt notes neck stiffness that limites ADL, driving, and turning. Pt is still doing barre, pilates but feels it tightens after. Pt is here to avoid worsening of pain like previous episode. Pt has to turn shoulders to see and drive. Denies 5 D's 3 N's.   PERTINENT HISTORY: N/A PAIN:  Are you having pain? No NPRS scale: 0/10 Pain location: L sided neck pain Pain description: sore ache Aggravating factors: rotation, flexion Relieving factors: naproxen  PRECAUTIONS: Knee  RED FLAGS: None   WEIGHT BEARING RESTRICTIONS: No  FALLS:  Has patient fallen in last 6 months? No  LIVING ENVIRONMENT: Lives with: lives with their family and lives with their spouse Lives in: House/apartment   OCCUPATION: fitness Secondary school teacher; Office manager (Biochemist, clinical)   PLOF: Independent  PATIENT GOALS: return  to normal exercise and ADL   OBJECTIVE:   DIAGNOSTIC FINDINGS: N/A  PATIENT SURVEYS:  NDI   Neck Disability Index score: 10 / 50 = 20.0 %  COGNITION: Overall cognitive status: Within functional limits for tasks assessed     SENSATION: WFL  EDEMA:   N/A   PALPATION:  TTP and hypertoncity of bilat C/S paraspinals, UT, and levator   CERVICAL ROM:   Active ROM A/PROM (deg) eval  Flexion 90%  Extension 50% p!  Right lateral flexion 50%  Left lateral flexion 25%  Right rotation 25%  Left rotation 15% p!   (Blank rows = not tested)   AROM Right eval  Left Eval   Shoulder flexion [facility]  Shoulder extension    Shoulder abduction [facility]  Shoulder adduction    Shoulder internal rotation [facility]  Shoulder external rotation [facility]  Elbow flexion    Elbow extension    Wrist flexion    Wrist extension     Wrist ulnar deviation    Wrist radial deviation    Wrist pronation    Wrist supination    (Blank rows = not tested)   TODAY'S TREATMENT:  3/17  Trigger Point Dry Needling  Subsequent Treatment: Instructions reviewed, if requested by the patient, prior to subsequent dry needling treatment.  (Pt is known to clinic and TPDN edu provided at many previous sessions)   Patient Verbal Consent Given: Yes Education Handout Provided: Previously Provided Muscles Treated: C3-4 multifidi and paraspinals bilat; L UT Electrical Stimulation Performed: No Treatment Response/Outcome: several large LTR elicited, improvement in ROM    STM muscles treated above Prone CPA C3-6 grade III; UPA bilat C3-6 grade III T/S CPA grade IV T3-10  Wall angel 2x10 Seated T/S ext with towel roll 2x10  3/10  Trigger Point Dry Needling  Subsequent Treatment: Instructions reviewed, if requested by the patient, prior to subsequent dry needling treatment.  (Pt is known to clinic and TPDN edu provided at many previous sessions)   Patient Verbal Consent Given: Yes Education Handout Provided: Previously Provided Muscles Treated: C3-4 multifidi and paraspinals bilat; L UT Electrical Stimulation Performed: No Treatment Response/Outcome: several large LTR elicited, improvement in ROM    STM muscles treated above Manual traction CPA C3-6 grade III; UPA bilat C3-6 grade III  Program Notes Banded Upper trap stretch (shrug and then tilt head away from hand as you slowly lower shoulder) 10x 2-3s hold  Exercises - Seated Cervical Retraction  - 2-3 x daily - 7 x weekly - 1 sets - 10 reps - Seated Assisted Cervical Rotation with Towel  - 2 x daily - 7 x weekly - 1 sets - 10 reps  Education details:  anatomy, exercise progression, DOMS expectations, muscle firing,  envelope of function, HEP, POC  Education method: Explanation, Demonstration, Tactile cues, Verbal cues, and Handouts Education comprehension: verbalized  understanding, returned demonstration, verbal cues required, tactile cues required, and needs further education     HOME EXERCISE PROGRAM: Access Code: [email or web address] URL: [email or web address] Date: 10/12/2023 Prepared by: Zebedee Iba   ASSESSMENT:   CLINICAL IMPRESSION:  Pt with improvement in baseline L UT pain since initial TPDN but is still limited with motion and feels stiffness at the neck with L>R. Pt with large LTR elicited at C/S and is very stiff into T/S ext and cervical rotation with mob. HEP updated to included thoracic mobility exercise. Plan for manual and TPDN for pain relief PRN then drop frequency for maintenance/prevention of  pain exacerbation given current stiffness and limitations. Pt would benefit from continued skilled therapy in order to reach goals and maximize functional C/S strength and ROM for full return to PLOF.    OBJECTIVE IMPAIRMENTS decreased ROM, decreased strength, hypomobility, increased fascial restrictions, increased muscle spasms, impaired flexibility, improper body mechanics, postural dysfunction, and pain.    ACTIVITY LIMITATIONS carrying, lifting, sitting   PARTICIPATION LIMITATIONS: driving, shopping, community activity, occupation, and yard work   PERSONAL FACTORS Age, Time since onset of injury/illness/exacerbation, and 1 comorbidity:    are also affecting patient's functional outcome.    REHAB POTENTIAL: Good   CLINICAL DECISION MAKING: Stable/uncomplicated   EVALUATION COMPLEXITY: Low     GOALS:   SHORT TERM GOALS: Target date: 11/23/2023      Pt will become independent with HEP in order to demonstrate synthesis of PT education.  Goal status: INITIAL   2.  Pt will report at least 2 pt reduction on NPRS scale for pain in order to demonstrate functional improvement with household activity, self care, and ADL.    Goal status: INITIAL       LONG TERM GOALS: Target date: 01/10/24     Pt  will become independent with  final HEP in order to demonstrate synthesis of PT education.    Goal status: INITIAL   2.  Pt will be able to demonstrate at least 20% improvement in C/S ROM without pain in order to demonstrate functional improvement in C/s function for return to PLOF.   Goal status: INITIAL   3.  Pt will be able to demonstrate/report ability to sit/stand/sleep for extended periods of time without pain in order to demonstrate functional improvement and tolerance to static positioning.  Goal status: INITIAL   4. Pt will demonstrate at least a 7.5 improvement in Neck Disability Index in order to demonstrate a clinically significant change in neck pain and function.  Baseline:  Goal status: INITIAL   PLAN: PT FREQUENCY: 1-2x/week   PT DURATION: 12 weeks (likely every other week)   PLANNED INTERVENTIONS: Therapeutic exercises, Therapeutic activity, Neuromuscular re-education, Balance training, Gait training, Patient/Family education, Self Care, Joint mobilization, Joint manipulation, Orthotic/Fit training, Aquatic Therapy, Dry Needling, Electrical stimulation, Spinal manipulation, Spinal mobilization, Cryotherapy, Moist heat, Compression bandaging, scar mobilization, Splintting, Taping, Vasopneumatic device, Traction, Ultrasound, Ionotophoresis 4mg /ml Dexamethasone, Manual therapy, and Re-evaluation   PLAN FOR NEXT SESSION: joint mobs, TPDN, C/S stretching, LAD     Zebedee Iba, PT 10/19/2023, 4:17 PM

## 2023-11-02 ENCOUNTER — Ambulatory Visit (HOSPITAL_BASED_OUTPATIENT_CLINIC_OR_DEPARTMENT_OTHER): Admitting: Physical Therapy

## 2023-11-02 ENCOUNTER — Encounter (HOSPITAL_BASED_OUTPATIENT_CLINIC_OR_DEPARTMENT_OTHER): Payer: Self-pay | Admitting: Physical Therapy

## 2023-11-02 DIAGNOSIS — M542 Cervicalgia: Secondary | ICD-10-CM

## 2023-11-02 NOTE — Therapy (Signed)
 OUTPATIENT PHYSICAL THERAPY CERVICAL EVALUATION    Patient Name: Arriyah Madej MRN: [id number] DOB:01-25-1956, 68 y.o., female Today's Date: 11/02/2023  END OF SESSION:  PT End of Session - 11/02/23 1609     Visit Number 3    Number of Visits 10    Date for PT Re-Evaluation 01/10/24    Authorization Type HTA    PT Start Time 1611    PT Stop Time 1652    PT Time Calculation (min) 41 min    Activity Tolerance Patient tolerated treatment well    Behavior During Therapy WFL for tasks assessed/performed                            Past Medical History:  Diagnosis Date   Anxiety    Arthritis    Elevated hemoglobin A1c 2019   5.7   Family history of DES exposure    Fibrocystic breast    GERD (gastroesophageal reflux disease)    HPV (human papilloma virus) infection    Hypercholesterolemia    OCD (obsessive compulsive disorder)    Osteopenia 2024   bilateral hips   Pre-diabetes    S/P excision of lipoma    Past Surgical History:  Procedure Laterality Date   ANKLE SURGERY     broken as a child   BREAST BIOPSY Right    COLONOSCOPY     DILATION AND CURETTAGE OF UTERUS     LIPOMA EXCISION     rib cage   TOTAL KNEE ARTHROPLASTY Left 02/02/2023   Procedure: LEFT TOTAL KNEE ARTHROPLASTY;  Surgeon: Tarry Kos, MD;  Location: [facility] OR;  Service: Orthopedics;  Laterality: Left;   TRIGGER FINGER RELEASE Left 03/2013   thumb   Patient Active Problem List   Diagnosis Date Noted   Status post total left knee replacement 02/02/2023   Primary osteoarthritis of left knee 02/01/2023   Family history of breast cancer in mother 12/12/2013   H/O diethylstilbestrol (DES) exposure in utero 12/12/2013    PCP:  Thana Ates, MD       REFERRING PROVIDER:  Thana Ates, MD      REFERRING DIAG:  M54.2 (ICD-10-CM) - Cervicalgia       THERAPY DIAG:  Cervicalgia  Rationale for Evaluation and Treatment: Rehabilitation  ONSET DATE: >1 year  SUBJECTIVE:    SUBJECTIVE STATEMENT:  It is getting better but still pulling a lot with stretching to the right.   Eval:  Pt notes that the L sided neck has returned into the L UT. No NT into the hands. Turning to the R is painful with increase in pain with L rotation. Pt notes neck stiffness that limites ADL, driving, and turning. Pt is still doing barre, pilates but feels it tightens after. Pt is here to avoid worsening of pain like previous episode. Pt has to turn shoulders to see and drive. Denies 5 D's 3 N's.   PERTINENT HISTORY: N/A PAIN:  Are you having pain? No NPRS scale: 0/10 Pain location: L sided neck pain Pain description: sore ache Aggravating factors: rotation, flexion Relieving factors: naproxen  PRECAUTIONS: Knee  RED FLAGS: None   WEIGHT BEARING RESTRICTIONS: No  FALLS:  Has patient fallen in last 6 months? No  LIVING ENVIRONMENT: Lives with: lives with their family and lives with their spouse Lives in: House/apartment   OCCUPATION: fitness Secondary school teacher; Office manager (Biochemist, clinical)   PLOF: Independent  PATIENT GOALS: return to normal  exercise and ADL   OBJECTIVE:   DIAGNOSTIC FINDINGS: N/A  PATIENT SURVEYS:  NDI   Neck Disability Index score: 10 / 50 = 20.0 %  COGNITION: Overall cognitive status: Within functional limits for tasks assessed     SENSATION: WFL  EDEMA:   N/A   PALPATION:  TTP and hypertoncity of bilat C/S paraspinals, UT, and levator   CERVICAL ROM:   Active ROM A/PROM (deg) eval  Flexion 90%  Extension 50% p!  Right lateral flexion 50%  Left lateral flexion 25%  Right rotation 25%  Left rotation 15% p!   (Blank rows = not tested)   AROM Right eval  Left Eval   Shoulder flexion [facility]  Shoulder extension    Shoulder abduction [facility]  Shoulder adduction    Shoulder internal rotation [facility]  Shoulder external rotation [facility]  Elbow flexion    Elbow extension    Wrist flexion    Wrist extension    Wrist ulnar  deviation    Wrist radial deviation    Wrist pronation    Wrist supination    (Blank rows = not tested)   TODAY'S TREATMENT:  Treatment                            3/31: Blank lines following charge title = not provided on this treatment date.   Manual:  TPDN YES Trigger Point Dry Needling  Subsequent Treatment: Instructions provided previously at initial dry needling treatment.   Patient Verbal Consent Given: Yes Education Handout Provided: Previously Provided Muscles Treated: bilateral upper trap, Lt levator scap Electrical Stimulation Performed: No Treatment Response/Outcome: twitch with decreased concordant spasm, soreness as expected Prone rib mobs with focus on Rt mid rib cage and STM to rhomboids Supine pin and stretch to upper trap & levator scap Cervical traction & suboccipital release.  There-ex: Shoulder rolls Cervical arom Supine thoracic extension over towel roll Cat/camel Prone thoracic extension with chin tuck Prone cervical retraction There-Act:  Self Care:  Nuro-Re-ed:  Gait Training:    3/17  Trigger Point Dry Needling  Subsequent Treatment: Instructions reviewed, if requested by the patient, prior to subsequent dry needling treatment.  (Pt is known to clinic and TPDN edu provided at many previous sessions)   Patient Verbal Consent Given: Yes Education Handout Provided: Previously Provided Muscles Treated: C3-4 multifidi and paraspinals bilat; L UT Electrical Stimulation Performed: No Treatment Response/Outcome: several large LTR elicited, improvement in ROM    STM muscles treated above Prone CPA C3-6 grade III; UPA bilat C3-6 grade III T/S CPA grade IV T3-10  Wall angel 2x10 Seated T/S ext with towel roll 2x10  3/10  Trigger Point Dry Needling  Subsequent Treatment: Instructions reviewed, if requested by the patient, prior to subsequent dry needling treatment.  (Pt is known to clinic and TPDN edu provided at many previous sessions)    Patient Verbal Consent Given: Yes Education Handout Provided: Previously Provided Muscles Treated: C3-4 multifidi and paraspinals bilat; L UT Electrical Stimulation Performed: No Treatment Response/Outcome: several large LTR elicited, improvement in ROM    STM muscles treated above Manual traction CPA C3-6 grade III; UPA bilat C3-6 grade III  Program Notes Banded Upper trap stretch (shrug and then tilt head away from hand as you slowly lower shoulder) 10x 2-3s hold  Exercises - Seated Cervical Retraction  - 2-3 x daily - 7 x weekly - 1 sets - 10 reps -  Seated Assisted Cervical Rotation with Towel  - 2 x daily - 7 x weekly - 1 sets - 10 reps  Education details:  anatomy, exercise progression, DOMS expectations, muscle firing,  envelope of function, HEP, POC  Education method: Explanation, Demonstration, Tactile cues, Verbal cues, and Handouts Education comprehension: verbalized understanding, returned demonstration, verbal cues required, tactile cues required, and needs further education     HOME EXERCISE PROGRAM: Access Code: [email or web address] URL: [email or web address] Date: 10/12/2023 Prepared by: Zebedee Iba   ASSESSMENT:   CLINICAL IMPRESSION:  Decreased tension following treatment today. Supine thoracic extension felt better for lower back. Notable tightness in Lt VL and encouraged rolling/massage gun use.    OBJECTIVE IMPAIRMENTS decreased ROM, decreased strength, hypomobility, increased fascial restrictions, increased muscle spasms, impaired flexibility, improper body mechanics, postural dysfunction, and pain.    ACTIVITY LIMITATIONS carrying, lifting, sitting   PARTICIPATION LIMITATIONS: driving, shopping, community activity, occupation, and yard work   PERSONAL FACTORS Age, Time since onset of injury/illness/exacerbation, and 1 comorbidity:    are also affecting patient's functional outcome.    REHAB POTENTIAL: Good   CLINICAL DECISION MAKING:  Stable/uncomplicated   EVALUATION COMPLEXITY: Low     GOALS:   SHORT TERM GOALS: Target date: 11/23/2023      Pt will become independent with HEP in order to demonstrate synthesis of PT education.  Goal status: INITIAL   2.  Pt will report at least 2 pt reduction on NPRS scale for pain in order to demonstrate functional improvement with household activity, self care, and ADL.    Goal status: INITIAL       LONG TERM GOALS: Target date: 01/10/24     Pt  will become independent with final HEP in order to demonstrate synthesis of PT education.    Goal status: INITIAL   2.  Pt will be able to demonstrate at least 20% improvement in C/S ROM without pain in order to demonstrate functional improvement in C/s function for return to PLOF.   Goal status: INITIAL   3.  Pt will be able to demonstrate/report ability to sit/stand/sleep for extended periods of time without pain in order to demonstrate functional improvement and tolerance to static positioning.  Goal status: INITIAL   4. Pt will demonstrate at least a 7.5 improvement in Neck Disability Index in order to demonstrate a clinically significant change in neck pain and function.  Baseline:  Goal status: INITIAL   PLAN: PT FREQUENCY: 1-2x/week   PT DURATION: 12 weeks (likely every other week)   PLANNED INTERVENTIONS: Therapeutic exercises, Therapeutic activity, Neuromuscular re-education, Balance training, Gait training, Patient/Family education, Self Care, Joint mobilization, Joint manipulation, Orthotic/Fit training, Aquatic Therapy, Dry Needling, Electrical stimulation, Spinal manipulation, Spinal mobilization, Cryotherapy, Moist heat, Compression bandaging, scar mobilization, Splintting, Taping, Vasopneumatic device, Traction, Ultrasound, Ionotophoresis 4mg /ml Dexamethasone, Manual therapy, and Re-evaluation   PLAN FOR NEXT SESSION: joint mobs, TPDN, C/S stretching, LAD    Becci Batty C. Kollyn Lingafelter PT, DPT 11/02/23 4:53  PM

## 2023-11-09 ENCOUNTER — Ambulatory Visit (HOSPITAL_BASED_OUTPATIENT_CLINIC_OR_DEPARTMENT_OTHER): Attending: Internal Medicine | Admitting: Physical Therapy

## 2023-11-09 ENCOUNTER — Encounter (HOSPITAL_BASED_OUTPATIENT_CLINIC_OR_DEPARTMENT_OTHER): Payer: Self-pay | Admitting: Physical Therapy

## 2023-11-09 DIAGNOSIS — M542 Cervicalgia: Secondary | ICD-10-CM | POA: Diagnosis not present

## 2023-11-09 NOTE — Therapy (Signed)
 OUTPATIENT PHYSICAL THERAPY CERVICAL EVALUATION    Patient Name: Ashley Blair MRN: [id number] DOB:06/07/1956, 68 y.o., female Today's Date: 11/09/2023  END OF SESSION:  PT End of Session - 11/09/23 0842     Visit Number 4    Number of Visits 10    Date for PT Re-Evaluation 01/10/24    Authorization Type HTA    PT Start Time 0801    PT Stop Time 0842    PT Time Calculation (min) 41 min    Activity Tolerance Patient tolerated treatment well    Behavior During Therapy [facility] for tasks assessed/performed                             Past Medical History:  Diagnosis Date   Anxiety    Arthritis    Elevated hemoglobin A1c 2019   5.7   Family history of DES exposure    Fibrocystic breast    GERD (gastroesophageal reflux disease)    HPV (human papilloma virus) infection    Hypercholesterolemia    OCD (obsessive compulsive disorder)    Osteopenia 2024   bilateral hips   Pre-diabetes    S/P excision of lipoma    Past Surgical History:  Procedure Laterality Date   ANKLE SURGERY     broken as a child   BREAST BIOPSY Right    COLONOSCOPY     DILATION AND CURETTAGE OF UTERUS     LIPOMA EXCISION     rib cage   TOTAL KNEE ARTHROPLASTY Left 02/02/2023   Procedure: LEFT TOTAL KNEE ARTHROPLASTY;  Surgeon: Tarry Kos, MD;  Location: [facility] OR;  Service: Orthopedics;  Laterality: Left;   TRIGGER FINGER RELEASE Left 03/2013   thumb   Patient Active Problem List   Diagnosis Date Noted   Status post total left knee replacement 02/02/2023   Primary osteoarthritis of left knee 02/01/2023   Family history of breast cancer in mother 12/12/2013   H/O diethylstilbestrol (DES) exposure in utero 12/12/2013    PCP:  Thana Ates, MD       REFERRING PROVIDER:  Thana Ates, MD      REFERRING DIAG:  M54.2 (ICD-10-CM) - Cervicalgia       THERAPY DIAG:  Cervicalgia  Rationale for Evaluation and Treatment: Rehabilitation  ONSET DATE: >1  year  SUBJECTIVE:   SUBJECTIVE STATEMENT:  Pt reports the thigh feels stiff from last time. The neck is still better. Still feels tightness and stiffness. Chin tucks no longer hurt.   Eval:  Pt notes that the L sided neck has returned into the L UT. No NT into the hands. Turning to the R is painful with increase in pain with L rotation. Pt notes neck stiffness that limites ADL, driving, and turning. Pt is still doing barre, pilates but feels it tightens after. Pt is here to avoid worsening of pain like previous episode. Pt has to turn shoulders to see and drive. Denies 5 D's 3 N's.   PERTINENT HISTORY: N/A PAIN:  Are you having pain? No NPRS scale: 0/10 Pain location: L sided neck pain Pain description: sore ache Aggravating factors: rotation, flexion Relieving factors: naproxen  PRECAUTIONS: Knee  RED FLAGS: None   WEIGHT BEARING RESTRICTIONS: No  FALLS:  Has patient fallen in last 6 months? No  LIVING ENVIRONMENT: Lives with: lives with their family and lives with their spouse Lives in: House/apartment   OCCUPATION: fitness Secondary school teacher; Office manager (basket  weaving)   PLOF: Independent  PATIENT GOALS: return to normal exercise and ADL   OBJECTIVE:   DIAGNOSTIC FINDINGS: N/A  PATIENT SURVEYS:  NDI   Neck Disability Index score: 10 / 50 = 20.0 %  COGNITION: Overall cognitive status: Within functional limits for tasks assessed     SENSATION: WFL  EDEMA:   N/A   PALPATION:  TTP and hypertoncity of bilat C/S paraspinals, UT, and levator   CERVICAL ROM:   Active ROM A/PROM (deg) eval  Flexion 90%  Extension 50% p!  Right lateral flexion 50%  Left lateral flexion 25%  Right rotation 25%  Left rotation 15% p!   (Blank rows = not tested)   AROM Right eval  Left Eval   Shoulder flexion [facility]  Shoulder extension    Shoulder abduction [facility]  Shoulder adduction    Shoulder internal rotation [facility]  Shoulder external rotation [facility]  Elbow  flexion    Elbow extension    Wrist flexion    Wrist extension    Wrist ulnar deviation    Wrist radial deviation    Wrist pronation    Wrist supination    (Blank rows = not tested)   TODAY'S TREATMENT:   4/7    TPDN YES Trigger Point Dry Needling  Subsequent Treatment: Instructions provided previously at initial dry needling treatment.   Patient Verbal Consent Given: Yes Education Handout Provided: Previously Provided Muscles Treated: L UT, L C3-6 paraspinal Electrical Stimulation Performed: No Treatment Response/Outcome: LTR elicited    Manual:  Prone UPA C3-7 L and R grade III Manual traction; manual traction with chin nod Supine side glide C3-6 grade III STM L UT and C/S paraspinal There-ex: Open book Suboccipital stretch  Chin tuck with rotation in supine Chin tuck    Treatment                            3/31: Blank lines following charge title = not provided on this treatment date.   Manual:  TPDN YES Trigger Point Dry Needling  Subsequent Treatment: Instructions provided previously at initial dry needling treatment.   Patient Verbal Consent Given: Yes Education Handout Provided: Previously Provided Muscles Treated: bilateral upper trap, Lt levator scap Electrical Stimulation Performed: No Treatment Response/Outcome: twitch with decreased concordant spasm, soreness as expected Prone rib mobs with focus on Rt mid rib cage and STM to rhomboids Supine pin and stretch to upper trap & levator scap Cervical traction & suboccipital release.  There-ex: Shoulder rolls Cervical arom Supine thoracic extension over towel roll Cat/camel Prone thoracic extension with chin tuck Prone cervical retraction There-Act:  Self Care:  Nuro-Re-ed:  Gait Training:    3/17  Trigger Point Dry Needling  Subsequent Treatment: Instructions reviewed, if requested by the patient, prior to subsequent dry needling treatment.  (Pt is known to clinic and TPDN edu  provided at many previous sessions)   Patient Verbal Consent Given: Yes Education Handout Provided: Previously Provided Muscles Treated: C3-4 multifidi and paraspinals bilat; L UT Electrical Stimulation Performed: No Treatment Response/Outcome: several large LTR elicited, improvement in ROM    STM muscles treated above Prone CPA C3-6 grade III; UPA bilat C3-6 grade III T/S CPA grade IV T3-10  Wall angel 2x10 Seated T/S ext with towel roll 2x10  3/10  Trigger Point Dry Needling  Subsequent Treatment: Instructions reviewed, if requested by the patient, prior to subsequent dry needling treatment.  (Pt  is known to clinic and TPDN edu provided at many previous sessions)   Patient Verbal Consent Given: Yes Education Handout Provided: Previously Provided Muscles Treated: C3-4 multifidi and paraspinals bilat; L UT Electrical Stimulation Performed: No Treatment Response/Outcome: several large LTR elicited, improvement in ROM    STM muscles treated above Manual traction CPA C3-6 grade III; UPA bilat C3-6 grade III  Program Notes Banded Upper trap stretch (shrug and then tilt head away from hand as you slowly lower shoulder) 10x 2-3s hold  Exercises - Seated Cervical Retraction  - 2-3 x daily - 7 x weekly - 1 sets - 10 reps - Seated Assisted Cervical Rotation with Towel  - 2 x daily - 7 x weekly - 1 sets - 10 reps  Education details:  anatomy, exercise progression, DOMS expectations, muscle firing,  envelope of function, HEP, POC  Education method: Explanation, Demonstration, Tactile cues, Verbal cues, and Handouts Education comprehension: verbalized understanding, returned demonstration, verbal cues required, tactile cues required, and needs further education     HOME EXERCISE PROGRAM: Access Code: [email or web address] URL: [email or web address] Date: 10/12/2023 Prepared by: Zebedee Iba   ASSESSMENT:   CLINICAL IMPRESSION:  Pt with decreased stiffness following session  at the C/S but continues to have soreness into mid-cervical region with L rotation. Pt does improve discomfort following self stretching technique. Pt HEP updated to include open book to improve T/S rotational movement. Plan to continue at a decreased frequency for prevention of further functional decline and pain increase.    OBJECTIVE IMPAIRMENTS decreased ROM, decreased strength, hypomobility, increased fascial restrictions, increased muscle spasms, impaired flexibility, improper body mechanics, postural dysfunction, and pain.    ACTIVITY LIMITATIONS carrying, lifting, sitting   PARTICIPATION LIMITATIONS: driving, shopping, community activity, occupation, and yard work   PERSONAL FACTORS Age, Time since onset of injury/illness/exacerbation, and 1 comorbidity:    are also affecting patient's functional outcome.    REHAB POTENTIAL: Good   CLINICAL DECISION MAKING: Stable/uncomplicated   EVALUATION COMPLEXITY: Low     GOALS:   SHORT TERM GOALS: Target date: 11/23/2023      Pt will become independent with HEP in order to demonstrate synthesis of PT education.  Goal status: INITIAL   2.  Pt will report at least 2 pt reduction on NPRS scale for pain in order to demonstrate functional improvement with household activity, self care, and ADL.    Goal status: INITIAL       LONG TERM GOALS: Target date: 01/10/24     Pt  will become independent with final HEP in order to demonstrate synthesis of PT education.    Goal status: INITIAL   2.  Pt will be able to demonstrate at least 20% improvement in C/S ROM without pain in order to demonstrate functional improvement in C/s function for return to PLOF.   Goal status: INITIAL   3.  Pt will be able to demonstrate/report ability to sit/stand/sleep for extended periods of time without pain in order to demonstrate functional improvement and tolerance to static positioning.  Goal status: INITIAL   4. Pt will demonstrate at least a 7.5  improvement in Neck Disability Index in order to demonstrate a clinically significant change in neck pain and function.  Baseline:  Goal status: INITIAL   PLAN: PT FREQUENCY: 1-2x/week   PT DURATION: 12 weeks (likely every other week)   PLANNED INTERVENTIONS: Therapeutic exercises, Therapeutic activity, Neuromuscular re-education, Balance training, Gait training, Patient/Family education, Self Care, Joint mobilization, Joint manipulation, Orthotic/Fit training, Aquatic Therapy,  Dry Needling, Electrical stimulation, Spinal manipulation, Spinal mobilization, Cryotherapy, Moist heat, Compression bandaging, scar mobilization, Splintting, Taping, Vasopneumatic device, Traction, Ultrasound, Ionotophoresis 4mg /ml Dexamethasone, Manual therapy, and Re-evaluation   PLAN FOR NEXT SESSION: joint mobs, TPDN, C/S stretching, LAD    Zebedee Iba PT, DPT 11/09/23 8:46 AM

## 2023-11-18 ENCOUNTER — Ambulatory Visit (HOSPITAL_BASED_OUTPATIENT_CLINIC_OR_DEPARTMENT_OTHER): Admitting: Physical Therapy

## 2023-11-18 ENCOUNTER — Encounter (HOSPITAL_BASED_OUTPATIENT_CLINIC_OR_DEPARTMENT_OTHER): Payer: Self-pay | Admitting: Physical Therapy

## 2023-11-18 DIAGNOSIS — M542 Cervicalgia: Secondary | ICD-10-CM

## 2023-11-18 NOTE — Therapy (Signed)
 OUTPATIENT PHYSICAL THERAPY CERVICAL EVALUATION    Patient Name: Ashley Blair MRN: [id number] DOB:09/09/1955, 68 y.o., female Today's Date: 11/18/2023  END OF SESSION:  PT End of Session - 11/18/23 1414     Visit Number 5    Number of Visits 10    Date for PT Re-Evaluation 01/10/24    Authorization Type HTA    PT Start Time 1317    PT Stop Time 1400    PT Time Calculation (min) 43 min    Activity Tolerance Patient tolerated treatment well    Behavior During Therapy WFL for tasks assessed/performed                              Past Medical History:  Diagnosis Date   Anxiety    Arthritis    Elevated hemoglobin A1c 2019   5.7   Family history of DES exposure    Fibrocystic breast    GERD (gastroesophageal reflux disease)    HPV (human papilloma virus) infection    Hypercholesterolemia    OCD (obsessive compulsive disorder)    Osteopenia 2024   bilateral hips   Pre-diabetes    S/P excision of lipoma    Past Surgical History:  Procedure Laterality Date   ANKLE SURGERY     broken as a child   BREAST BIOPSY Right    COLONOSCOPY     DILATION AND CURETTAGE OF UTERUS     LIPOMA EXCISION     rib cage   TOTAL KNEE ARTHROPLASTY Left 02/02/2023   Procedure: LEFT TOTAL KNEE ARTHROPLASTY;  Surgeon: Tarry Kos, MD;  Location: [facility] OR;  Service: Orthopedics;  Laterality: Left;   TRIGGER FINGER RELEASE Left 03/2013   thumb   Patient Active Problem List   Diagnosis Date Noted   Status post total left knee replacement 02/02/2023   Primary osteoarthritis of left knee 02/01/2023   Family history of breast cancer in mother 12/12/2013   H/O diethylstilbestrol (DES) exposure in utero 12/12/2013    PCP:  Thana Ates, MD       REFERRING PROVIDER:  Thana Ates, MD      REFERRING DIAG:  M54.2 (ICD-10-CM) - Cervicalgia       THERAPY DIAG:  Cervicalgia  Rationale for Evaluation and Treatment: Rehabilitation  ONSET DATE: >1  year  SUBJECTIVE:   SUBJECTIVE STATEMENT:  Pt reports that stiffness and discomfort improved for a few days after last session. Feels it is stiff again today and having diffuclty with turning, especially after a Barre class.   Eval:  Pt notes that the L sided neck has returned into the L UT. No NT into the hands. Turning to the R is painful with increase in pain with L rotation. Pt notes neck stiffness that limites ADL, driving, and turning. Pt is still doing barre, pilates but feels it tightens after. Pt is here to avoid worsening of pain like previous episode. Pt has to turn shoulders to see and drive. Denies 5 D's 3 N's.   PERTINENT HISTORY: N/A PAIN:  Are you having pain? No NPRS scale: 0/10 Pain location: L sided neck pain Pain description: sore ache Aggravating factors: rotation, flexion Relieving factors: naproxen  PRECAUTIONS: Knee  RED FLAGS: None   WEIGHT BEARING RESTRICTIONS: No  FALLS:  Has patient fallen in last 6 months? No  LIVING ENVIRONMENT: Lives with: lives with their family and lives with their spouse Lives in: House/apartment  OCCUPATION: fitness Secondary school teacher; Animal nutritionist)   PLOF: Independent  PATIENT GOALS: return to normal exercise and ADL   OBJECTIVE:   DIAGNOSTIC FINDINGS: N/A  PATIENT SURVEYS:  NDI   Neck Disability Index score: 10 / 50 = 20.0 %  COGNITION: Overall cognitive status: Within functional limits for tasks assessed     SENSATION: WFL  EDEMA:   N/A   PALPATION:  TTP and hypertoncity of bilat C/S paraspinals, UT, and levator   CERVICAL ROM:   Active ROM A/PROM (deg) eval  Flexion 90%  Extension 50% p!  Right lateral flexion 50%  Left lateral flexion 25%  Right rotation 25%  Left rotation 15% p!   (Blank rows = not tested)   AROM Right eval  Left Eval   Shoulder flexion [facility]  Shoulder extension    Shoulder abduction [facility]  Shoulder adduction    Shoulder internal rotation [facility]   Shoulder external rotation [facility]  Elbow flexion    Elbow extension    Wrist flexion    Wrist extension    Wrist ulnar deviation    Wrist radial deviation    Wrist pronation    Wrist supination    (Blank rows = not tested)   TODAY'S TREATMENT:   4/16  TPDN YES Trigger Point Dry Needling  Subsequent Treatment: Instructions provided previously at initial dry needling treatment.   Patient Verbal Consent Given: Yes Education Handout Provided: Previously Provided Muscles Treated: L C3-5 paraspinal Electrical Stimulation Performed: No Treatment Response/Outcome: LTR elicited    Manual:  Prone UPA C3-7 L and R grade III Manual traction; manual traction with chin nod Supine side glide C3-6 grade IV STM L UT and C/S paraspinal  4/7    TPDN YES Trigger Point Dry Needling  Subsequent Treatment: Instructions provided previously at initial dry needling treatment.   Patient Verbal Consent Given: Yes Education Handout Provided: Previously Provided Muscles Treated: L UT, L C3-6 paraspinal Electrical Stimulation Performed: No Treatment Response/Outcome: LTR elicited    Manual:  Prone UPA C3-7 L and R grade III Manual traction; manual traction with chin nod Supine side glide C3-6 grade III STM L UT and C/S paraspinal There-ex: Open book Suboccipital stretch  Chin tuck with rotation in supine Chin tuck    Treatment                            3/31: Blank lines following charge title = not provided on this treatment date.   Manual:  TPDN YES Trigger Point Dry Needling  Subsequent Treatment: Instructions provided previously at initial dry needling treatment.   Patient Verbal Consent Given: Yes Education Handout Provided: Previously Provided Muscles Treated: bilateral upper trap, Lt levator scap Electrical Stimulation Performed: No Treatment Response/Outcome: twitch with decreased concordant spasm, soreness as expected Prone rib mobs with focus on Rt mid rib cage  and STM to rhomboids Supine pin and stretch to upper trap & levator scap Cervical traction & suboccipital release.  There-ex: Shoulder rolls Cervical arom Supine thoracic extension over towel roll Cat/camel Prone thoracic extension with chin tuck Prone cervical retraction There-Act:  Self Care:  Nuro-Re-ed:  Gait Training:    3/17  Trigger Point Dry Needling  Subsequent Treatment: Instructions reviewed, if requested by the patient, prior to subsequent dry needling treatment.  (Pt is known to clinic and TPDN edu provided at many previous sessions)   Patient Verbal Consent Given: Yes Education Handout  Provided: Previously Provided Muscles Treated: C3-4 multifidi and paraspinals bilat; L UT Electrical Stimulation Performed: No Treatment Response/Outcome: several large LTR elicited, improvement in ROM    STM muscles treated above Prone CPA C3-6 grade III; UPA bilat C3-6 grade III T/S CPA grade IV T3-10  Wall angel 2x10 Seated T/S ext with towel roll 2x10  3/10  Trigger Point Dry Needling  Subsequent Treatment: Instructions reviewed, if requested by the patient, prior to subsequent dry needling treatment.  (Pt is known to clinic and TPDN edu provided at many previous sessions)   Patient Verbal Consent Given: Yes Education Handout Provided: Previously Provided Muscles Treated: C3-4 multifidi and paraspinals bilat; L UT Electrical Stimulation Performed: No Treatment Response/Outcome: several large LTR elicited, improvement in ROM    STM muscles treated above Manual traction CPA C3-6 grade III; UPA bilat C3-6 grade III  Program Notes Banded Upper trap stretch (shrug and then tilt head away from hand as you slowly lower shoulder) 10x 2-3s hold  Exercises - Seated Cervical Retraction  - 2-3 x daily - 7 x weekly - 1 sets - 10 reps - Seated Assisted Cervical Rotation with Towel  - 2 x daily - 7 x weekly - 1 sets - 10 reps  Education details:  anatomy, exercise  progression, DOMS expectations, muscle firing,  envelope of function, HEP, POC  Education method: Explanation, Demonstration, Tactile cues, Verbal cues, and Handouts Education comprehension: verbalized understanding, returned demonstration, verbal cues required, tactile cues required, and needs further education     HOME EXERCISE PROGRAM: Access Code: [email or web address] URL: [email or web address] Date: 10/12/2023 Prepared by: Zebedee Iba   ASSESSMENT:   CLINICAL IMPRESSION:  Pt reports significant improvement in cervical stiffness following TPDN and manual with less pain with looking down/cervical flexion. Pt does have L SB preference in static sitting that improves following session.  Plan to continue at a decreased frequency for prevention of further functional decline and pain increase.    OBJECTIVE IMPAIRMENTS decreased ROM, decreased strength, hypomobility, increased fascial restrictions, increased muscle spasms, impaired flexibility, improper body mechanics, postural dysfunction, and pain.    ACTIVITY LIMITATIONS carrying, lifting, sitting   PARTICIPATION LIMITATIONS: driving, shopping, community activity, occupation, and yard work   PERSONAL FACTORS Age, Time since onset of injury/illness/exacerbation, and 1 comorbidity:    are also affecting patient's functional outcome.    REHAB POTENTIAL: Good   CLINICAL DECISION MAKING: Stable/uncomplicated   EVALUATION COMPLEXITY: Low     GOALS:   SHORT TERM GOALS: Target date: 11/23/2023      Pt will become independent with HEP in order to demonstrate synthesis of PT education.  Goal status: INITIAL   2.  Pt will report at least 2 pt reduction on NPRS scale for pain in order to demonstrate functional improvement with household activity, self care, and ADL.    Goal status: INITIAL       LONG TERM GOALS: Target date: 01/10/24     Pt  will become independent with final HEP in order to demonstrate synthesis of PT education.     Goal status: INITIAL   2.  Pt will be able to demonstrate at least 20% improvement in C/S ROM without pain in order to demonstrate functional improvement in C/s function for return to PLOF.   Goal status: INITIAL   3.  Pt will be able to demonstrate/report ability to sit/stand/sleep for extended periods of time without pain in order to demonstrate functional improvement and tolerance to static positioning.  Goal status: INITIAL  4. Pt will demonstrate at least a 7.5 improvement in Neck Disability Index in order to demonstrate a clinically significant change in neck pain and function.  Baseline:  Goal status: INITIAL   PLAN: PT FREQUENCY: 1-2x/week   PT DURATION: 12 weeks (likely every other week)   PLANNED INTERVENTIONS: Therapeutic exercises, Therapeutic activity, Neuromuscular re-education, Balance training, Gait training, Patient/Family education, Self Care, Joint mobilization, Joint manipulation, Orthotic/Fit training, Aquatic Therapy, Dry Needling, Electrical stimulation, Spinal manipulation, Spinal mobilization, Cryotherapy, Moist heat, Compression bandaging, scar mobilization, Splintting, Taping, Vasopneumatic device, Traction, Ultrasound, Ionotophoresis 4mg /ml Dexamethasone, Manual therapy, and Re-evaluation   PLAN FOR NEXT SESSION: joint mobs, TPDN, C/S stretching, LAD    Silver Dross PT, DPT 11/18/23 2:18 PM

## 2023-11-24 DIAGNOSIS — L2084 Intrinsic (allergic) eczema: Secondary | ICD-10-CM | POA: Diagnosis not present

## 2023-11-24 DIAGNOSIS — D2262 Melanocytic nevi of left upper limb, including shoulder: Secondary | ICD-10-CM | POA: Diagnosis not present

## 2023-11-24 DIAGNOSIS — L578 Other skin changes due to chronic exposure to nonionizing radiation: Secondary | ICD-10-CM | POA: Diagnosis not present

## 2023-11-24 DIAGNOSIS — D2271 Melanocytic nevi of right lower limb, including hip: Secondary | ICD-10-CM | POA: Diagnosis not present

## 2023-11-24 DIAGNOSIS — D225 Melanocytic nevi of trunk: Secondary | ICD-10-CM | POA: Diagnosis not present

## 2023-11-24 DIAGNOSIS — L814 Other melanin hyperpigmentation: Secondary | ICD-10-CM | POA: Diagnosis not present

## 2023-11-24 DIAGNOSIS — D2272 Melanocytic nevi of left lower limb, including hip: Secondary | ICD-10-CM | POA: Diagnosis not present

## 2023-11-24 DIAGNOSIS — J069 Acute upper respiratory infection, unspecified: Secondary | ICD-10-CM | POA: Diagnosis not present

## 2023-11-24 DIAGNOSIS — J309 Allergic rhinitis, unspecified: Secondary | ICD-10-CM | POA: Diagnosis not present

## 2023-11-24 DIAGNOSIS — R051 Acute cough: Secondary | ICD-10-CM | POA: Diagnosis not present

## 2023-11-24 DIAGNOSIS — L821 Other seborrheic keratosis: Secondary | ICD-10-CM | POA: Diagnosis not present

## 2023-12-09 ENCOUNTER — Encounter (HOSPITAL_BASED_OUTPATIENT_CLINIC_OR_DEPARTMENT_OTHER): Admitting: Physical Therapy

## 2024-01-06 ENCOUNTER — Encounter (HOSPITAL_BASED_OUTPATIENT_CLINIC_OR_DEPARTMENT_OTHER): Admitting: Physical Therapy

## 2024-02-09 ENCOUNTER — Other Ambulatory Visit (INDEPENDENT_AMBULATORY_CARE_PROVIDER_SITE_OTHER): Payer: Self-pay

## 2024-02-09 ENCOUNTER — Ambulatory Visit: Payer: HMO | Admitting: Orthopaedic Surgery

## 2024-02-09 DIAGNOSIS — Z96652 Presence of left artificial knee joint: Secondary | ICD-10-CM

## 2024-02-09 NOTE — Progress Notes (Signed)
 Patient: Ashley Blair           Date of Birth: [birth year]           MRN: [id number] Visit Date: 02/09/2024 PCP: Dwight Trula SQUIBB, MD   Assessment & Plan:  Chief Complaint:  Chief Complaint  Patient presents with   Left Knee - Follow-up    Left total knee arthroplasty 02/02/2023   Visit Diagnoses:  1. Status post total left knee replacement     Plan: History of Present Illness Ashley Blair is a 68 year old female with a history of knee replacement who presents with stiffness and discomfort in the knee.  She experiences ongoing stiffness in the knee, which she attributes more to the muscles than the joint. The stiffness is persistent but varies in intensity, with some days being worse than others. Discomfort is inconsistent in location, affecting different areas of the knee and surrounding muscles. Despite these symptoms, she remains active, walking about four miles five days a week and participating in Pilates and barre classes.  She occasionally takes Naprosyn  to manage symptoms, particularly when they become more pronounced, as they have been over the past three days. She describes a sensation of fluid in the knee, contributing to a feeling of tightness. She manages this with stretching exercises, although she finds it challenging to stretch the affected side as effectively as the other.  Physical Exam MUSCULOSKELETAL: Mild effusion in the knee.  Range of motion 0 to 110 degrees.  Results RADIOLOGY Knee X-ray: Implant appears intact and well-positioned.  Assessment and Plan Status post left total knee arthroplasty One year post knee replacement with soft tissue inflammation and stiffness post activity. X-rays show well-positioned implant, no loosening or infection. Naproxen  provides relief. Condition expected to improve, but some inflammation may persist. - Advise Naproxen  as needed for inflammation and swelling. - Recommend temporary reduction in activity if  symptoms worsen. - Encourage stretching exercises to maintain flexibility. - Reassure symptoms are not damaging the knee replacement.  Follow-up care for knee replacement and dental prophylaxis Current prescription ends in September. One more year of prophylaxis needed. - Schedule follow-up visit in one year for two-year post-operative check-up.  Follow-Up Instructions: Return in about 1 year (around 02/08/2025).   Orders:  Orders Placed This Encounter  Procedures   XR Knee 1-2 Views Left   No orders of the defined types were placed in this encounter.   Imaging: XR Knee 1-2 Views Left Result Date: 02/09/2024 X-rays of the left knee show a stable left total knee replacement in good alignment.    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 12/12/2013   Past Medical History:  Diagnosis Date   Anxiety    Arthritis    Elevated hemoglobin A1c 2019   5.7   Family history of DES exposure    Fibrocystic breast    GERD (gastroesophageal reflux disease)    HPV (human papilloma virus) infection    Hypercholesterolemia    OCD (obsessive compulsive disorder)    Osteopenia 2024   bilateral hips   Pre-diabetes    S/P excision of lipoma     Family History  Problem Relation Age of Onset   Breast cancer Mother    Osteoporosis Maternal Grandmother  Stroke Maternal Grandfather     Past Surgical History:  Procedure Laterality Date   ANKLE SURGERY     broken as a child   BREAST BIOPSY Right    COLONOSCOPY     DILATION AND CURETTAGE OF UTERUS     LIPOMA EXCISION     rib cage   TOTAL KNEE ARTHROPLASTY Left 02/02/2023   Procedure: LEFT TOTAL KNEE ARTHROPLASTY;  Surgeon: Jerri Kay HERO, MD;  Location: [facility] OR;  Service: Orthopedics;  Laterality: Left;   TRIGGER  FINGER RELEASE Left 03/2013   thumb   Social History   Occupational History   Not on file  Tobacco Use   Smoking status: Former    Current packs/day: 0.00    Types: Cigarettes    Quit date: 12/07/1975    Years since quitting: 48.2   Smokeless tobacco: Never  Vaping Use   Vaping status: Never Used  Substance and Sexual Activity   Alcohol use: Not Currently    Comment: rarely   Drug use: No   Sexual activity: Not Currently    Partners: Male    Birth control/protection: Post-menopausal

## 2024-03-11 DIAGNOSIS — M542 Cervicalgia: Secondary | ICD-10-CM | POA: Diagnosis not present

## 2024-03-11 DIAGNOSIS — E559 Vitamin D deficiency, unspecified: Secondary | ICD-10-CM | POA: Diagnosis not present

## 2024-03-11 DIAGNOSIS — F419 Anxiety disorder, unspecified: Secondary | ICD-10-CM | POA: Diagnosis not present

## 2024-03-11 DIAGNOSIS — R7303 Prediabetes: Secondary | ICD-10-CM | POA: Diagnosis not present

## 2024-03-11 DIAGNOSIS — R109 Unspecified abdominal pain: Secondary | ICD-10-CM | POA: Diagnosis not present

## 2024-03-11 DIAGNOSIS — Z0000 Encounter for general adult medical examination without abnormal findings: Secondary | ICD-10-CM | POA: Diagnosis not present

## 2024-03-11 DIAGNOSIS — K219 Gastro-esophageal reflux disease without esophagitis: Secondary | ICD-10-CM | POA: Diagnosis not present

## 2024-03-11 DIAGNOSIS — E7800 Pure hypercholesterolemia, unspecified: Secondary | ICD-10-CM | POA: Diagnosis not present

## 2024-04-13 ENCOUNTER — Ambulatory Visit: Admitting: Orthopaedic Surgery

## 2024-04-27 ENCOUNTER — Other Ambulatory Visit (HOSPITAL_BASED_OUTPATIENT_CLINIC_OR_DEPARTMENT_OTHER): Payer: Self-pay

## 2024-04-27 MED ORDER — COMIRNATY 30 MCG/0.3ML IM SUSY
0.3000 mL | PREFILLED_SYRINGE | Freq: Once | INTRAMUSCULAR | 0 refills | Status: AC
  Filled 2024-04-27: qty 0.3, 1d supply, fill #0

## 2024-04-28 DIAGNOSIS — Z1231 Encounter for screening mammogram for malignant neoplasm of breast: Secondary | ICD-10-CM | POA: Diagnosis not present

## 2024-04-28 LAB — HM MAMMOGRAPHY

## 2024-05-02 ENCOUNTER — Ambulatory Visit: Payer: Self-pay | Admitting: Obstetrics and Gynecology

## 2024-05-02 ENCOUNTER — Encounter: Payer: Self-pay | Admitting: Obstetrics and Gynecology

## 2024-05-09 ENCOUNTER — Other Ambulatory Visit (HOSPITAL_BASED_OUTPATIENT_CLINIC_OR_DEPARTMENT_OTHER): Payer: Self-pay

## 2024-05-09 MED ORDER — FLUZONE HIGH-DOSE 0.5 ML IM SUSY
0.5000 mL | PREFILLED_SYRINGE | Freq: Once | INTRAMUSCULAR | 0 refills | Status: AC
  Filled 2024-05-09: qty 0.5, 1d supply, fill #0

## 2024-05-18 DIAGNOSIS — H2513 Age-related nuclear cataract, bilateral: Secondary | ICD-10-CM | POA: Diagnosis not present

## 2024-05-18 DIAGNOSIS — H04123 Dry eye syndrome of bilateral lacrimal glands: Secondary | ICD-10-CM | POA: Diagnosis not present

## 2024-05-18 DIAGNOSIS — H524 Presbyopia: Secondary | ICD-10-CM | POA: Diagnosis not present

## 2024-05-18 DIAGNOSIS — H5203 Hypermetropia, bilateral: Secondary | ICD-10-CM | POA: Diagnosis not present

## 2024-05-26 ENCOUNTER — Other Ambulatory Visit (INDEPENDENT_AMBULATORY_CARE_PROVIDER_SITE_OTHER): Payer: Self-pay

## 2024-05-26 ENCOUNTER — Ambulatory Visit: Admitting: Orthopaedic Surgery

## 2024-05-26 DIAGNOSIS — Z96652 Presence of left artificial knee joint: Secondary | ICD-10-CM | POA: Diagnosis not present

## 2024-05-26 DIAGNOSIS — M1711 Unilateral primary osteoarthritis, right knee: Secondary | ICD-10-CM | POA: Diagnosis not present

## 2024-05-26 MED ORDER — METHYLPREDNISOLONE 4 MG PO TBPK: ORAL_TABLET | ORAL | 0 refills | Status: DC

## 2024-05-26 NOTE — Progress Notes (Signed)
 Office Visit Note   Patient: Ashley Blair           Date of Birth: Oct 12, 1955           MRN: [id number] Visit Date: 05/26/2024              Requested by: Dwight Trula SQUIBB, MD [address] [place],  [place] [address] PCP: Dwight Trula SQUIBB, MD   Assessment & Plan: Visit Diagnoses:  1. Status post total left knee replacement   2. Primary osteoarthritis of right knee     Plan: History of Present Illness Ashley Blair is a 68 year old female with a history of left knee replacement who presents with knee pain and swelling.  Also   She experiences knee pain and swelling primarily on the lateral side, with tightness and reduced range of motion. Pain worsens with activities like ascending and descending stairs, though she can manage them one at a time. Swelling resembles post-surgery experiences. Naprosyn  has been taken for two days, with unclear effectiveness for knee pain. No fever, chills, or pain at rest. The knee feels worse when inactive and improves with movement. Current medications include famotidine, Lexapro , and calcium with vitamin D. She has a history of arthritis.   Physical Exam MUSCULOSKELETAL: Left knee non-tender to palpation, full extension, flexion 110 degrees.  Collaterals are stable.  Fully healed surgical scar.  No joint effusion.  No signs of infection.    Assessment and Plan Chronic left knee lateral patellar friction syndrome Increased tightness and pain due to quadriceps strength imbalance causing patellar maltracking.  - Prescribed Medrol Dosepak for inflammation. Discussed potential side effects:  - Referred to physical therapy for quadriceps strengthening and kinesiotaping. - Discussed kinesiotaping for patellar tracking management. - will obtain approval for gel shots for future use pending insurance approval. - No activity restrictions; continue normal activities as tolerated.  This patient is diagnosed with osteoarthritis of the  right knee.    Radiographs show evidence of joint space narrowing, osteophytes, subchondral sclerosis and/or subchondral cysts.  This patient has knee pain which interferes with functional and activities of daily living.    This patient has experienced inadequate response, adverse effects and/or intolerance with conservative treatments such as acetaminophen , NSAIDS, topical creams, physical therapy or regular exercise, knee bracing and/or weight loss.   This patient has experienced inadequate response or has a contraindication to intra articular steroid injections for at least 3 months.   This patient is not scheduled to have a total knee replacement within 6 months of starting treatment with viscosupplementation.  Follow-Up Instructions: No follow-ups on file.   Orders:  Orders Placed This Encounter  Procedures   XR Knee 1-2 Views Left   Ambulatory referral to Physical Therapy   Ambulatory request for injection medication   Meds ordered this encounter  Medications   methylPREDNISolone (MEDROL DOSEPAK) 4 MG TBPK tablet    Sig: Take as directed    Dispense:  21 tablet    Refill:  0      Procedures: No procedures performed   Clinical Data: No additional findings.   Subjective: Chief Complaint  Patient presents with   Left Knee - Pain    Left TKA 02/02/2023    HPI  Review of Systems  Constitutional: Negative.   HENT: Negative.    Eyes: Negative.   Respiratory: Negative.    Cardiovascular: Negative.   Endocrine: Negative.   Musculoskeletal: Negative.   Neurological: Negative.   Hematological:  Negative.   Psychiatric/Behavioral: Negative.    All other systems reviewed and are negative.    Objective: Vital Signs: LMP 08/04/2002 (Approximate)   Physical Exam Vitals and nursing note reviewed.  Constitutional:      Appearance: She is well-developed.  HENT:     Head: Atraumatic.     Nose: Nose normal.  Eyes:     Extraocular Movements: Extraocular movements  intact.  Cardiovascular:     Pulses: Normal pulses.  Pulmonary:     Effort: Pulmonary effort is normal.  Abdominal:     Palpations: Abdomen is soft.  Musculoskeletal:     Cervical back: Neck supple.  Skin:    General: Skin is warm.     Capillary Refill: Capillary refill takes less than 2 seconds.  Neurological:     Mental Status: She is alert. Mental status is at baseline.  Psychiatric:        Behavior: Behavior normal.        Thought Content: Thought content normal.        Judgment: Judgment normal.     Ortho Exam  Specialty Comments:  No specialty comments available.  Imaging: XR Knee 1-2 Views Left Result Date: 05/26/2024 Xrays show well positioned components.  Normally positioned patella within trochlea.    PMFS History: Patient Active Problem List   Diagnosis Date Noted   Primary osteoarthritis of right knee 05/26/2024   Status post total left knee replacement 02/02/2023   Primary osteoarthritis of left knee 02/01/2023   Family history of breast cancer in mother 12/12/2013   H/O diethylstilbestrol (DES) exposure in utero 12/12/2013   Past Medical History:  Diagnosis Date   Anxiety    Arthritis    Elevated hemoglobin A1c 2019   5.7   Family history of DES exposure    Fibrocystic breast    GERD (gastroesophageal reflux disease)    HPV (human papilloma virus) infection    Hypercholesterolemia    OCD (obsessive compulsive disorder)    Osteopenia 2024   bilateral hips   Pre-diabetes    S/P excision of lipoma     Family History  Problem Relation Age of Onset   Breast cancer Mother    Osteoporosis Maternal Grandmother    Stroke Maternal Grandfather     Past Surgical History:  Procedure Laterality Date   ANKLE SURGERY     broken as a child   BREAST BIOPSY Right    COLONOSCOPY     DILATION AND CURETTAGE OF UTERUS     LIPOMA EXCISION     rib cage   TOTAL KNEE ARTHROPLASTY Left 02/02/2023   Procedure: LEFT TOTAL KNEE ARTHROPLASTY;  Surgeon: Jerri Kay HERO, MD;  Location: [facility] OR;  Service: Orthopedics;  Laterality: Left;   TRIGGER FINGER RELEASE Left 03/2013   thumb   Social History   Occupational History   Not on file  Tobacco Use   Smoking status: Former    Current packs/day: 0.00    Types: Cigarettes    Quit date: 12/07/1975    Years since quitting: 48.5   Smokeless tobacco: Never  Vaping Use   Vaping status: Never Used  Substance and Sexual Activity   Alcohol use: Not Currently    Comment: rarely   Drug use: No   Sexual activity: Not Currently    Partners: Male    Birth control/protection: Post-menopausal

## 2024-05-30 ENCOUNTER — Encounter: Payer: Self-pay | Admitting: Orthopaedic Surgery

## 2024-05-30 ENCOUNTER — Telehealth: Payer: Self-pay

## 2024-05-30 ENCOUNTER — Other Ambulatory Visit: Payer: Self-pay | Admitting: Orthopaedic Surgery

## 2024-05-30 DIAGNOSIS — Z96652 Presence of left artificial knee joint: Secondary | ICD-10-CM

## 2024-05-30 NOTE — Telephone Encounter (Signed)
 Patient called and left VM omn Triage line. States she started taking prednisone pack yesterday (6 pills) and today (2 pills) and is having redness in her face and heat is pouring out of her face. Would like to know if she needs to stop and what to do next

## 2024-05-30 NOTE — Telephone Encounter (Signed)
Yes she should stop

## 2024-05-30 NOTE — Telephone Encounter (Signed)
 Patient aware

## 2024-05-30 NOTE — Telephone Encounter (Signed)
 Already spoke to patient.

## 2024-06-06 ENCOUNTER — Encounter: Payer: Self-pay | Admitting: Radiology

## 2024-06-29 ENCOUNTER — Other Ambulatory Visit (HOSPITAL_COMMUNITY)
Admission: RE | Admit: 2024-06-29 | Discharge: 2024-06-29 | Disposition: A | Source: Ambulatory Visit | Attending: Obstetrics and Gynecology | Admitting: Obstetrics and Gynecology

## 2024-06-29 ENCOUNTER — Encounter: Payer: Self-pay | Admitting: Obstetrics and Gynecology

## 2024-06-29 ENCOUNTER — Ambulatory Visit: Payer: HMO | Admitting: Obstetrics and Gynecology

## 2024-06-29 VITALS — BP 114/76 | HR 64 | Ht 63.5 in | Wt 140.0 lb

## 2024-06-29 DIAGNOSIS — Z124 Encounter for screening for malignant neoplasm of cervix: Secondary | ICD-10-CM | POA: Insufficient documentation

## 2024-06-29 DIAGNOSIS — Z91B Personal risk factor of exposure to diethylstilbestrol: Secondary | ICD-10-CM | POA: Insufficient documentation

## 2024-06-29 DIAGNOSIS — Z9189 Other specified personal risk factors, not elsewhere classified: Secondary | ICD-10-CM | POA: Diagnosis not present

## 2024-06-29 DIAGNOSIS — M8589 Other specified disorders of bone density and structure, multiple sites: Secondary | ICD-10-CM

## 2024-06-29 DIAGNOSIS — Z01419 Encounter for gynecological examination (general) (routine) without abnormal findings: Secondary | ICD-10-CM

## 2024-06-29 NOTE — Progress Notes (Signed)
 68 y.o. G1P0 Married Caucasian female here for a breast and pelvic exam.    No GYN concerns.   The patient is also followed for hx DES exposure and osteopenia.   Hx osteopenia.    PCP: Dwight Trula SQUIBB, MD   Patient's last menstrual period was 08/04/2002 (approximate).           Sexually active: No.  The current method of family planning is post menopausal status.    Menopausal hormone therapy:  n/a Exercising: Yes.    Pilate's, walking, and barr class  Smoker:  Former  OB History     Gravida  1   Para  0   Term      Preterm      AB      Living  0      SAB      IAB      Ectopic      Multiple      Live Births              HEALTH MAINTENANCE: Last 2 paps: 06/24/23 neg, 06/18/22 neg History of abnormal Pap or positive HPV:  no Mammogram:  04/28/24 Breast Density Cat C, BIRADS Cat 1 neg  Colonoscopy:  11/2016 normal  Bone Density:  06/18/23  Result  osteopenia    Immunization History  Administered Date(s) Administered   Fluad  Trivalent(High Dose 65+) 05/08/2023   INFLUENZA, HIGH DOSE SEASONAL PF 05/09/2024   PFIZER(Purple Top)SARS-COV-2 Vaccination 08/24/2019, 09/14/2019, 04/26/2020   Pfizer Covid-19 Vaccine Bivalent Booster 60yrs & up 12/05/2021   Pfizer(Comirnaty )Fall Seasonal Vaccine 12 years and older 04/27/2024      reports that she quit smoking about 48 years ago. Her smoking use included cigarettes. She has never used smokeless tobacco. She reports that she does not currently use alcohol. She reports that she does not use drugs.  Past Medical History:  Diagnosis Date   Anxiety    Arthritis    Elevated hemoglobin A1c 2019   5.7   Family history of DES exposure    Fibrocystic breast    GERD (gastroesophageal reflux disease)    HPV (human papilloma virus) infection    Hypercholesterolemia    OCD (obsessive compulsive disorder)    Osteopenia 2024   bilateral hips   Pre-diabetes    S/P excision of lipoma     Past Surgical History:   Procedure Laterality Date   ANKLE SURGERY     broken as a child   BREAST BIOPSY Right    COLONOSCOPY     DILATION AND CURETTAGE OF UTERUS     LIPOMA EXCISION     rib cage   TOTAL KNEE ARTHROPLASTY Left 02/02/2023   Procedure: LEFT TOTAL KNEE ARTHROPLASTY;  Surgeon: Jerri Kay HERO, MD;  Location: [facility] OR;  Service: Orthopedics;  Laterality: Left;   TRIGGER FINGER RELEASE Left 03/2013   thumb    Current Outpatient Medications  Medication Sig Dispense Refill   augmented betamethasone  dipropionate (DIPROLENE -AF) 0.05 % ointment Apply 1 Application topically as needed (skin rash).     calcium-vitamin D (OSCAL WITH D) 500-5 MG-MCG tablet Take 1 tablet by mouth.     escitalopram  (LEXAPRO ) 10 MG tablet Take 10 mg by mouth daily.     famotidine (PEPCID) 40 MG tablet Take 40 mg by mouth daily.     naproxen  (NAPROSYN ) 500 MG tablet Take 1 tablet (500 mg total) by mouth 2 (two) times daily with a meal. 30 tablet 3   Polyethylene  Glycol 3350 (MIRALAX PO) Take by mouth.     No current facility-administered medications for this visit.    ALLERGIES: Aspirin , Cortisone, Esomeprazole, Lansoprazole, Omeprazole, Omeprazole-sodium bicarbonate, and Pantoprazole  Family History  Problem Relation Age of Onset   Breast cancer Mother    Osteoporosis Maternal Grandmother    Stroke Maternal Grandfather     Review of Systems  All other systems reviewed and are negative.   PHYSICAL EXAM:  BP 114/76 (BP Location: Left Arm, Patient Position: Sitting)   Pulse 64   Ht 5' 3.5 (1.613 m)   Wt 140 lb (63.5 kg)   LMP 08/04/2002 (Approximate)   SpO2 95%   BMI 24.41 kg/m     General appearance: alert, cooperative and appears stated age Head: normocephalic, without obvious abnormality, atraumatic Neck: no adenopathy, supple, symmetrical, trachea midline and thyroid normal to inspection and palpation Lungs: clear to auscultation bilaterally Breasts: normal appearance, no masses or tenderness, No nipple  retraction or dimpling, No nipple discharge or bleeding, No axillary adenopathy Heart: regular rate and rhythm Abdomen: soft, non-tender; no masses, no organomegaly Extremities: extremities normal, atraumatic, no cyanosis or edema Skin: skin color, texture, turgor normal. No rashes or lesions Lymph nodes: cervical, supraclavicular, and axillary nodes normal. Neurologic: grossly normal  Pelvic: External genitalia:  no lesions              No abnormal inguinal nodes palpated.              Urethra:  normal appearing urethra with no masses, tenderness or lesions              Bartholins and Skenes: normal                 Vagina: normal appearing vagina with normal color and discharge, no lesions.  Atrophy noted.               Cervix: no lesions              Pap taken: yes Bimanual Exam:  Uterus:  normal size, contour, position, consistency, mobility, non-tender              Adnexa: no mass, fullness, tenderness              Rectal exam: yes.  Confirms.              Anus:  normal sphincter tone, no lesions  Chaperone was present for exam:  Kari HERO, CMA  ASSESSMENT: Encounter for breast and pelvic exam.  GYN exam for high risk Medicare patient. Hx DES exposure.  Vaginal atrophy.  FH of breast cancer.  Genetic testing negative in her mother.  Osteopenia.  Hips.  FRAX numbers low.  Hemorrhoids. Hx elevated HgbA1C.   Elevated cholesterol.  PLAN: Mammogram screening discussed. Self breast awareness reviewed. Pap and HRV collected:  yes Guidelines for Calcium, Vitamin D, regular exercise program including cardiovascular and weight bearing exercise. Medication refills:  NA.   We discussed vaginal estrogen for atrophy.  Rx declined.  Will get patient's dexa report from [facility] for 2024.  The link in Epic does not appear to open.  Her next dexa is due in one year.   Follow up:  yearly and prn.    10 min  total time was spent for this patient encounter, including preparation, face-to-face  counseling with the patient, coordination of care, and documentation of the encounter.

## 2024-06-29 NOTE — Patient Instructions (Signed)

## 2024-07-03 LAB — CYTOLOGY - PAP
Comment: NEGATIVE
Diagnosis: NEGATIVE
High risk HPV: NEGATIVE

## 2024-07-06 ENCOUNTER — Ambulatory Visit (HOSPITAL_BASED_OUTPATIENT_CLINIC_OR_DEPARTMENT_OTHER): Admitting: Physical Therapy

## 2024-07-07 ENCOUNTER — Ambulatory Visit: Payer: Self-pay | Admitting: Obstetrics and Gynecology

## 2024-07-20 ENCOUNTER — Other Ambulatory Visit: Payer: Self-pay | Admitting: Orthopaedic Surgery

## 2025-02-08 ENCOUNTER — Ambulatory Visit: Admitting: Physician Assistant

## 2025-07-12 ENCOUNTER — Encounter: Admitting: Obstetrics and Gynecology
